# Patient Record
Sex: Female | Born: 1935 | Race: White | Hispanic: No | Marital: Married | Smoking: Never smoker
Health system: Southern US, Community
[De-identification: ages and names within clinical notes are randomized; demographics above are authoritative.]

## PROBLEM LIST (undated history)

## (undated) DIAGNOSIS — Z9889 Other specified postprocedural states: Secondary | ICD-10-CM

## (undated) DIAGNOSIS — Z9289 Personal history of other medical treatment: Secondary | ICD-10-CM

## (undated) DIAGNOSIS — M1990 Unspecified osteoarthritis, unspecified site: Secondary | ICD-10-CM

## (undated) DIAGNOSIS — I10 Essential (primary) hypertension: Secondary | ICD-10-CM

## (undated) DIAGNOSIS — I8390 Asymptomatic varicose veins of unspecified lower extremity: Secondary | ICD-10-CM

## (undated) DIAGNOSIS — R112 Nausea with vomiting, unspecified: Secondary | ICD-10-CM

## (undated) DIAGNOSIS — Z9109 Other allergy status, other than to drugs and biological substances: Secondary | ICD-10-CM

## (undated) DIAGNOSIS — C801 Malignant (primary) neoplasm, unspecified: Secondary | ICD-10-CM

## (undated) DIAGNOSIS — E079 Disorder of thyroid, unspecified: Secondary | ICD-10-CM

## (undated) DIAGNOSIS — Z85038 Personal history of other malignant neoplasm of large intestine: Secondary | ICD-10-CM

## (undated) DIAGNOSIS — I4891 Unspecified atrial fibrillation: Secondary | ICD-10-CM

## (undated) HISTORY — DX: Malignant (primary) neoplasm, unspecified: C80.1

## (undated) HISTORY — DX: Unspecified osteoarthritis, unspecified site: M19.90

## (undated) HISTORY — DX: Unspecified atrial fibrillation: I48.91

## (undated) HISTORY — DX: Disorder of thyroid, unspecified: E07.9

## (undated) HISTORY — DX: Other allergy status, other than to drugs and biological substances: Z91.09

## (undated) HISTORY — DX: Personal history of other medical treatment: Z92.89

## (undated) HISTORY — DX: Personal history of other malignant neoplasm of large intestine: Z85.038

## (undated) HISTORY — DX: Hemochromatosis, unspecified: E83.119

## (undated) HISTORY — DX: Essential (primary) hypertension: I10

---

## 1989-12-31 HISTORY — PX: BREAST SURGERY: SHX581

## 1989-12-31 HISTORY — PX: BREAST EXCISIONAL BIOPSY: SUR124

## 1994-12-31 HISTORY — PX: COLON SURGERY: SHX602

## 1996-12-31 HISTORY — DX: Hemochromatosis, unspecified: E83.119

## 1998-04-21 ENCOUNTER — Other Ambulatory Visit: Admission: RE | Admit: 1998-04-21 | Discharge: 1998-04-21 | Payer: Self-pay | Admitting: Oncology

## 1998-06-09 ENCOUNTER — Ambulatory Visit (HOSPITAL_COMMUNITY): Admission: RE | Admit: 1998-06-09 | Discharge: 1998-06-09 | Payer: Self-pay | Admitting: Internal Medicine

## 1998-07-26 ENCOUNTER — Other Ambulatory Visit: Admission: RE | Admit: 1998-07-26 | Discharge: 1998-07-26 | Payer: Self-pay | Admitting: Oncology

## 2000-05-14 ENCOUNTER — Ambulatory Visit (HOSPITAL_COMMUNITY): Admission: RE | Admit: 2000-05-14 | Discharge: 2000-05-14 | Payer: Self-pay | Admitting: Gastroenterology

## 2000-10-03 ENCOUNTER — Ambulatory Visit (HOSPITAL_COMMUNITY): Admission: RE | Admit: 2000-10-03 | Discharge: 2000-10-03 | Payer: Self-pay | Admitting: Orthopedic Surgery

## 2000-10-03 ENCOUNTER — Encounter: Payer: Self-pay | Admitting: Orthopedic Surgery

## 2001-06-19 ENCOUNTER — Encounter: Payer: Self-pay | Admitting: Orthopedic Surgery

## 2001-06-19 ENCOUNTER — Encounter: Admission: RE | Admit: 2001-06-19 | Discharge: 2001-06-19 | Payer: Self-pay | Admitting: Orthopedic Surgery

## 2002-06-30 ENCOUNTER — Encounter: Payer: Self-pay | Admitting: Orthopedic Surgery

## 2002-06-30 ENCOUNTER — Encounter: Admission: RE | Admit: 2002-06-30 | Discharge: 2002-06-30 | Payer: Self-pay | Admitting: Orthopedic Surgery

## 2002-12-31 HISTORY — PX: TOTAL HIP ARTHROPLASTY: SHX124

## 2003-08-30 ENCOUNTER — Ambulatory Visit (HOSPITAL_COMMUNITY): Admission: RE | Admit: 2003-08-30 | Discharge: 2003-08-30 | Payer: Self-pay | Admitting: Gastroenterology

## 2003-11-29 ENCOUNTER — Inpatient Hospital Stay (HOSPITAL_COMMUNITY): Admission: RE | Admit: 2003-11-29 | Discharge: 2003-12-02 | Payer: Self-pay | Admitting: Orthopedic Surgery

## 2003-12-02 ENCOUNTER — Inpatient Hospital Stay (HOSPITAL_COMMUNITY)
Admission: RE | Admit: 2003-12-02 | Discharge: 2003-12-08 | Payer: Self-pay | Admitting: Physical Medicine & Rehabilitation

## 2003-12-31 ENCOUNTER — Inpatient Hospital Stay (HOSPITAL_COMMUNITY): Admission: EM | Admit: 2003-12-31 | Discharge: 2004-01-14 | Payer: Self-pay | Admitting: Emergency Medicine

## 2004-01-01 HISTORY — PX: GALLBLADDER SURGERY: SHX652

## 2004-01-12 ENCOUNTER — Encounter (INDEPENDENT_AMBULATORY_CARE_PROVIDER_SITE_OTHER): Payer: Self-pay | Admitting: *Deleted

## 2004-05-23 ENCOUNTER — Encounter: Admission: RE | Admit: 2004-05-23 | Discharge: 2004-05-23 | Payer: Self-pay | Admitting: Gastroenterology

## 2004-07-25 IMAGING — CT CT ABDOMEN W/ CM
1 series · 14 of 32 positions shown, 18 images · IV contrast (WATER & 150M L OMNI 300)
Comparison: none

CLINICAL DATA: Abdominal pain with laboratory evident of pancreatitis. 
CT ABDOMEN WITH CONTRAST, CT PELVIS WITH CONTRAST 12/31/03
No prior CT studies for comparison. 
Contrast:  150 cc Omnipaque 300 IV.  Oral contrast was not administered but the patient did receive several cups of water to distend the stomach and duodenum just prior to the procedure.  
CT ABDOMEN WITH CONTRAST
Lung bases show bibasilar atelectasis.  
The dominant finding in the abdomen is evidence of severe pancreatitis with inflammatory changes and fluid surrounding the majority of the pancreas.  No associated pancreatic necrosis is evident by CT.  There is no evidence of focal abscess.  
There are associated visible gallstones in the gallbladder as well as significant dilatation of the common bile duct which measures 14.0 mm in maximum diameter.  A small amount of anterior perihepatic fluid is present as well as pericholecystic fluid.  Peripancreatic fluid extends inferiorly in the retroperitoneum to abut bilateral pericolic regions.  No associated bowel obstruction. 
Several hepatic cysts are present.  The largest located in the anterior and superior right hepatic lobe measures 5.7 cm in greatest diameter.  This particular cyst shows internal Hounsfield unit density measurements of 30 to 40 which is consistent with higher density complex fluid.  No associated air in this collection.  At least five other cysts are present in the liver.  These all contain simple fluid internally by Hounsfield unit density measurements.  Other cysts are annotated with measurements and densities on the CT data set.  
Giant right renal cyst present emanating from the upper pole and measuring 8.7 cm in greatest diameter.  Internal densities consistent with simple fluid.  No evidence of hydronephrosis.  Focal periumbilical hernia present containing a portion of the colon.  This is not associated with abnormal fluid collection or bowel wall thickening.  
IMPRESSION
Severe pancreatitis with associated cholelithiasis and common bile duct dilatation to 14.0 mm; there may be obstructing stones and or sludge in the distal common duct.  Associated peripancreatic and retroperitoneal fluid as well as perihepatic and pericholecystic fluid.  No pancreatic necrosis or focal abscess is evident by CT.  
Multiple hepatic cysts, one of which shows complex internal density measurement.   There also is a large right renal cyst which appears simple. 
Small periumbilical hernia containing a portion of the colon. 
CT PELVIS WITH CONTRAST
A small amount of free fluid tracks into the pelvis from pancreatitis in the abdomen above.  Pelvic bowel loops show no inflammation or dilatation.  The bladder is moderately distended. 
Small amount of free pelvic fluid.  

ree

[Series 2: abd pelvis · axial · 0.66mm/px · z∈[-403,+12]mm · 14 of 123 slices shown, 18 images]
[im 8/123  soft-tissue]
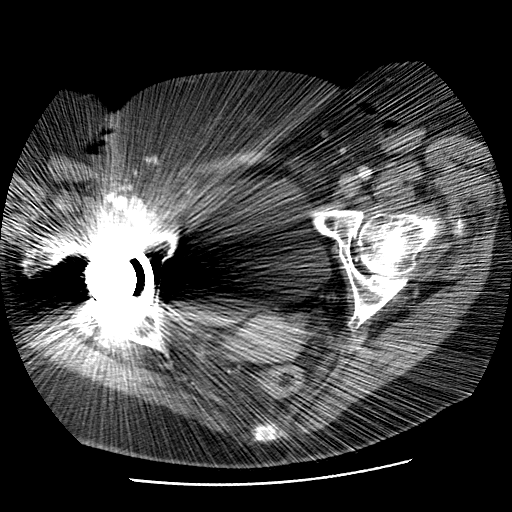
[im 8/123  bone]
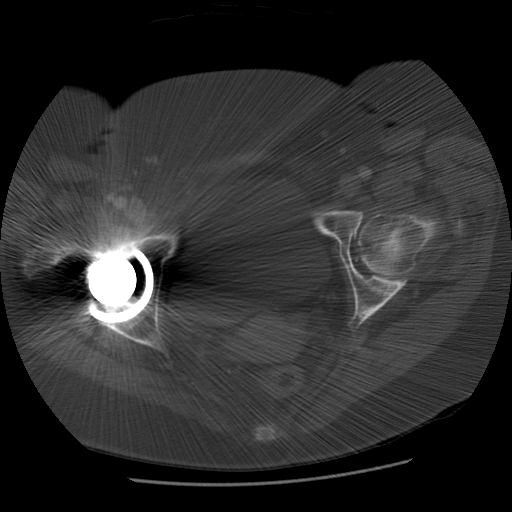
[im 16/123  soft-tissue]
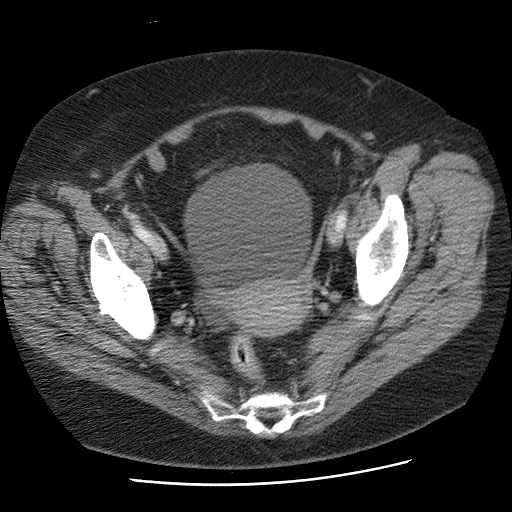
[im 28/123  soft-tissue]
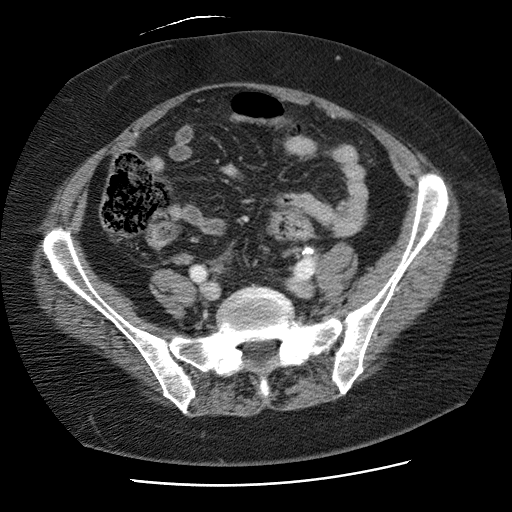
[im 36/123  soft-tissue]
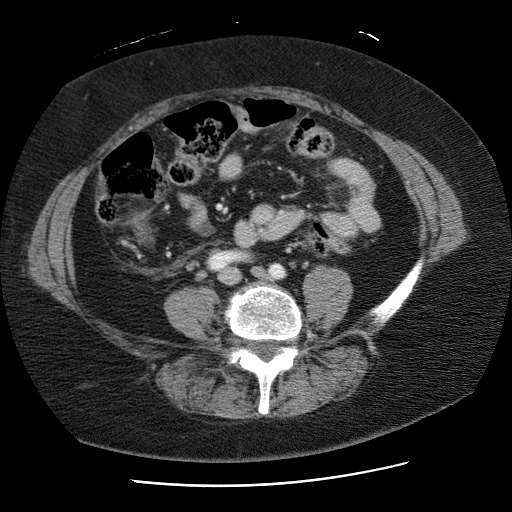
[im 48/123  soft-tissue]
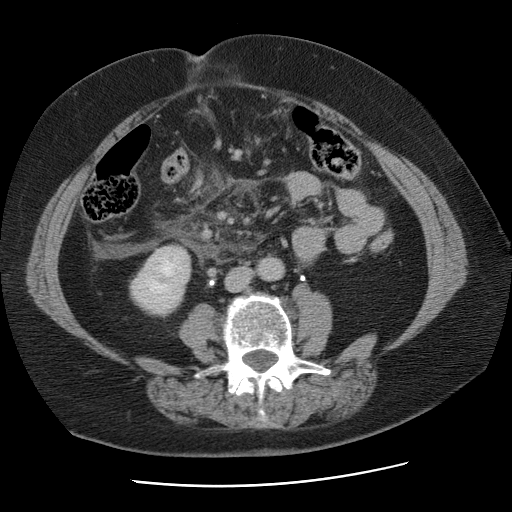
[im 56/123  soft-tissue]
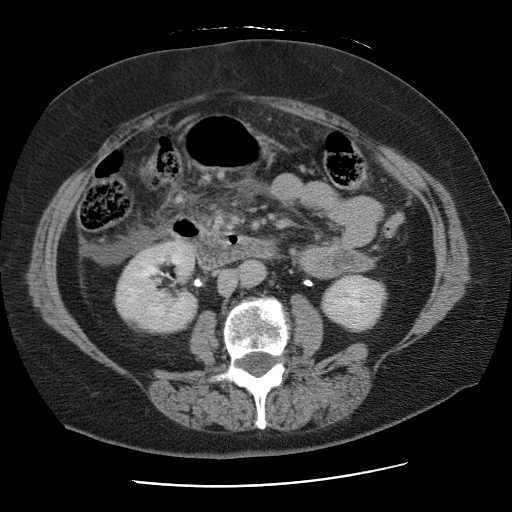
[im 67/123  soft-tissue]
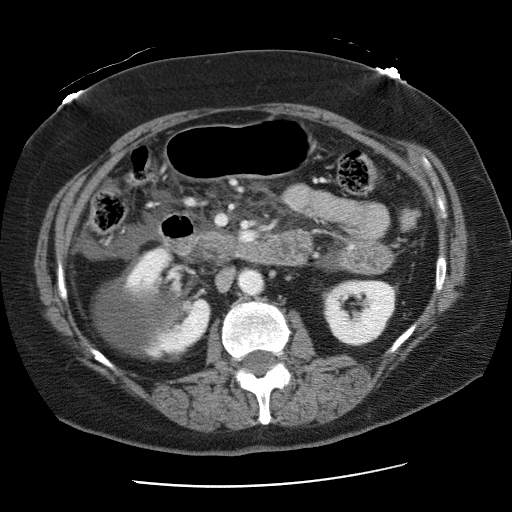
[im 75/123  soft-tissue]
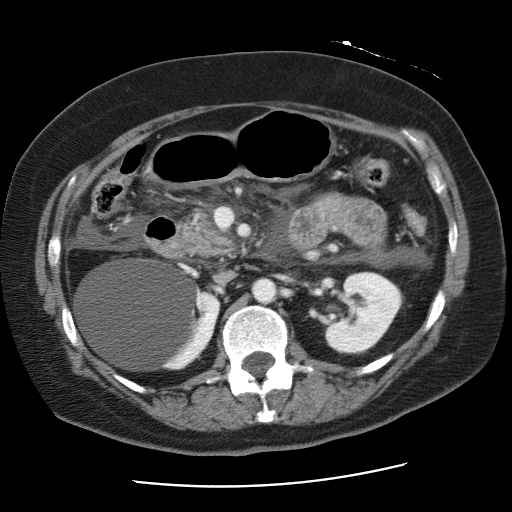
[im 87/123  soft-tissue]
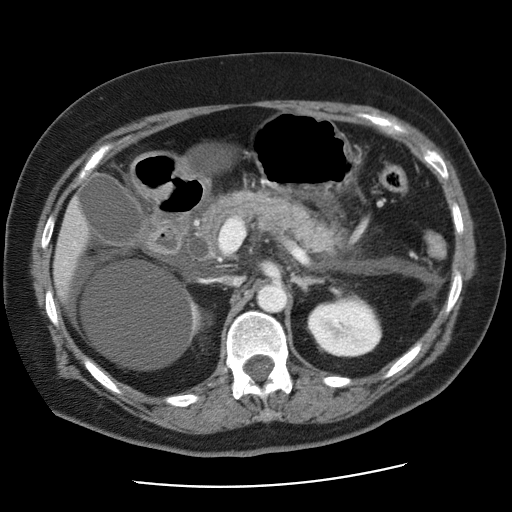
[im 87/123  bone]
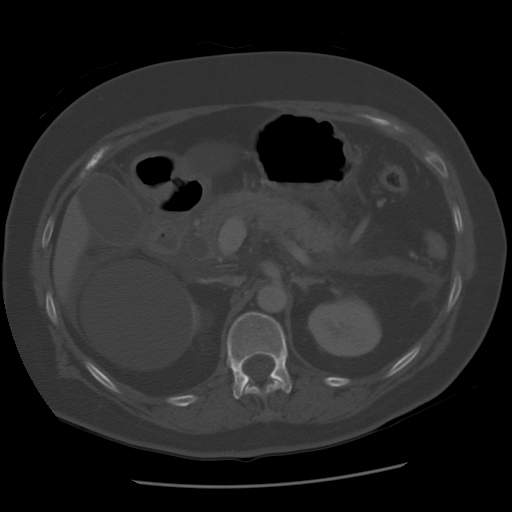
[im 95/123  soft-tissue]
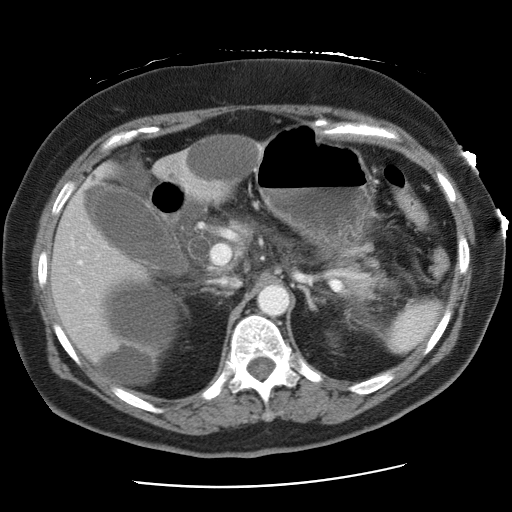
[im 107/123  soft-tissue]
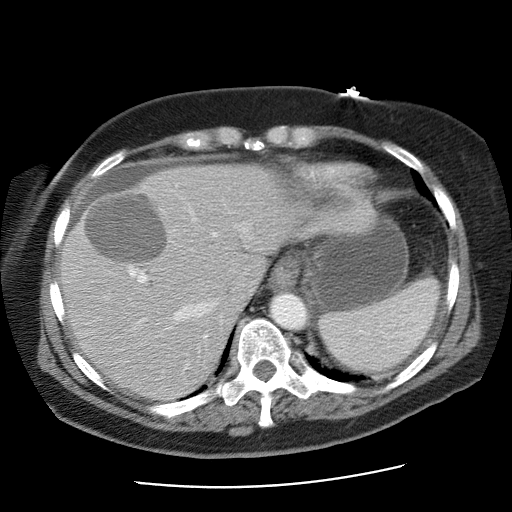
[im 107/123  lung]
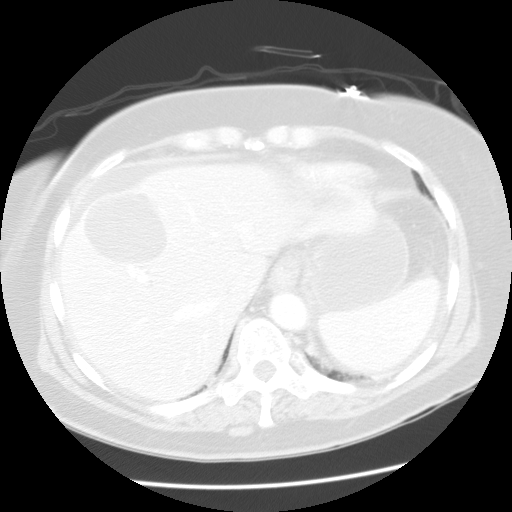
[im 111/123  lung]
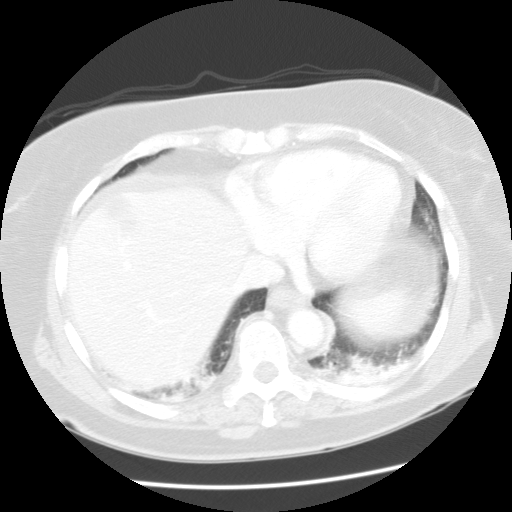
[im 115/123  soft-tissue]
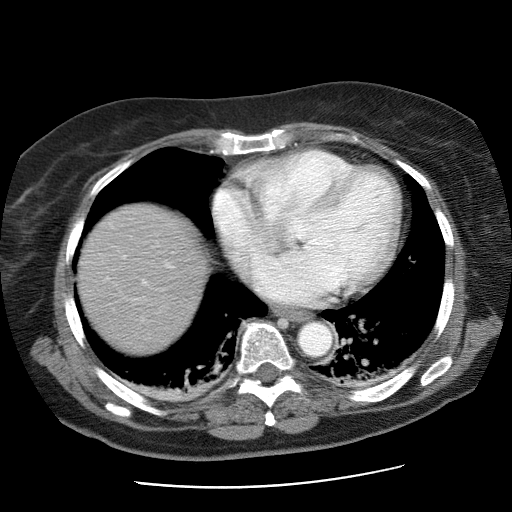
[im 115/123  lung]
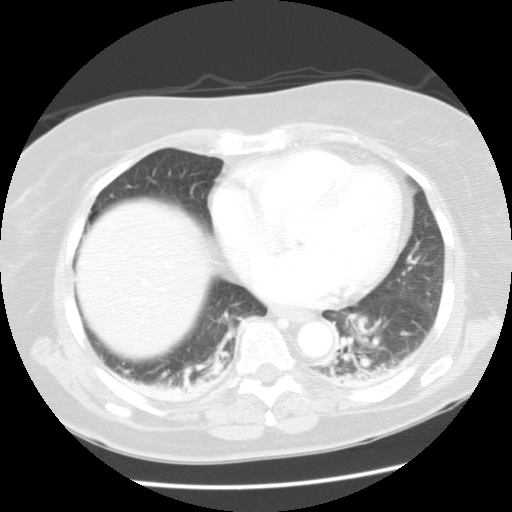
[im 119/123  lung]
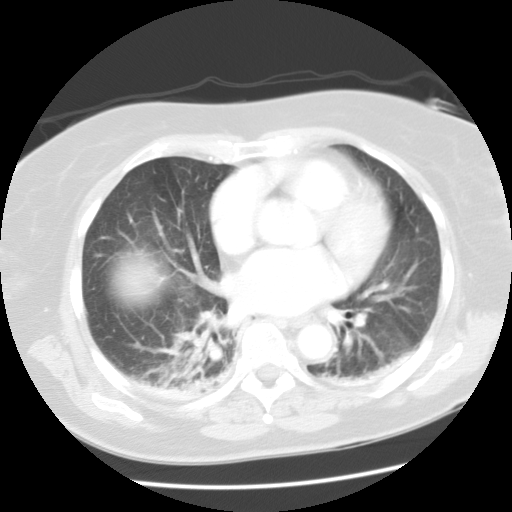

[14 of 32 positions shown; findings below may reference images not displayed]

## 2004-07-25 IMAGING — US US ABDOMEN COMPLETE
1 series · 13 of 25 positions shown · non-contrast
Comparison: none

CLINICAL DATA: Abdominal pain. Pancreatitis.  Dilated common bile duct. 
COMPLETE ABDOMINAL ULTRASOUND ? 12/31/03
Ultrasound demonstrates numerous stones in the gallbladder.  Gallbladder wall is thickened at 8.1mm. The common bile duct is dilated to a diameter of 1.2 cm.  There appears to be sludge or small stones that don't shadow  in the distal common bile duct.  
There is a complex cystic lesion in the anterior aspect of the right lobe of the liver just superior to the gallbladder. On the CT scan, there appear to be a fairly simple cystic lesion. However, on ultrasound, it has mixed solid and cystic components that measure approximately 5 cm in diameter. The other lesions in the liver on CT scan appear to be cysts.  
There is an 8.7 cm simple appearing cyst on the upper pole of the right kidney.  The left kidney is normal.  
The spleen is not enlarged.  
IMPRESSION 
Numerous gallstones with a thickened gallbladder wall and a dilated common bile duct with evidence consistent with a stone in the distal common bile duct. 
Complex mixed solid and cystic lesion in the anterior aspect of the right lobe of the liver.  MRI may be useful for further characterization of this complex lesion.  The patient does have multiple other benign appearing cystic lesions in the liver and this perhaps is a hemorrhagic or infected cyst.

[Series 1: unknown · 0.38mm/px · 13 of 72 slices shown]
[im 1/72]
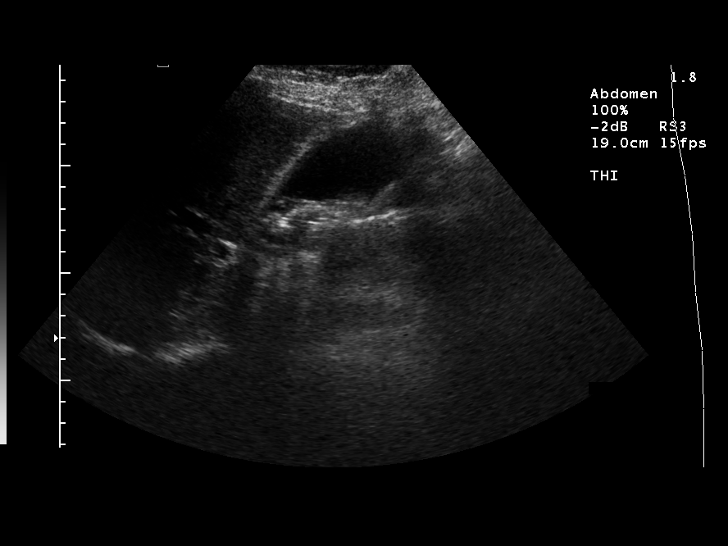
[im 6/72]
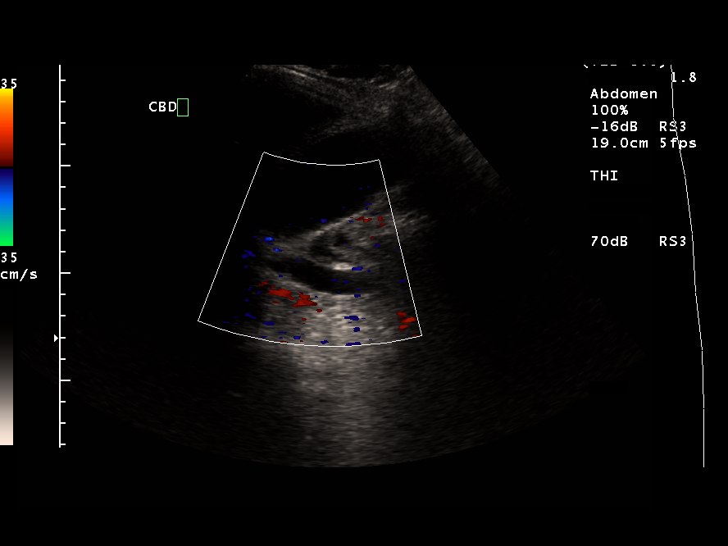
[im 12/72]
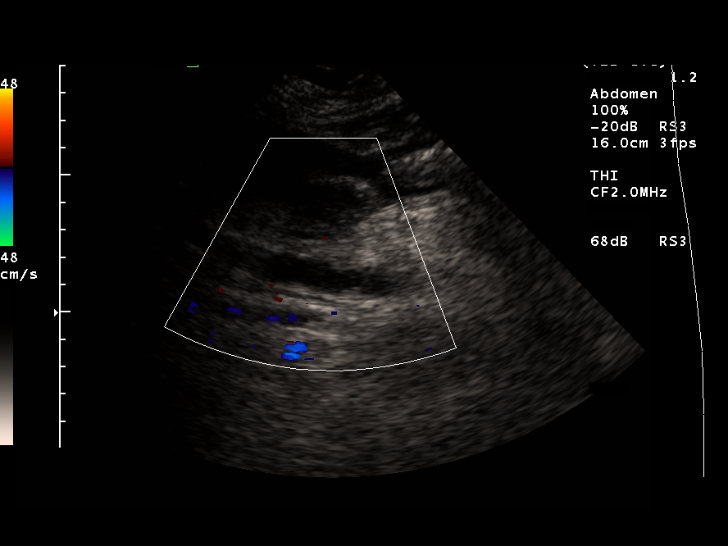
[im 18/72]
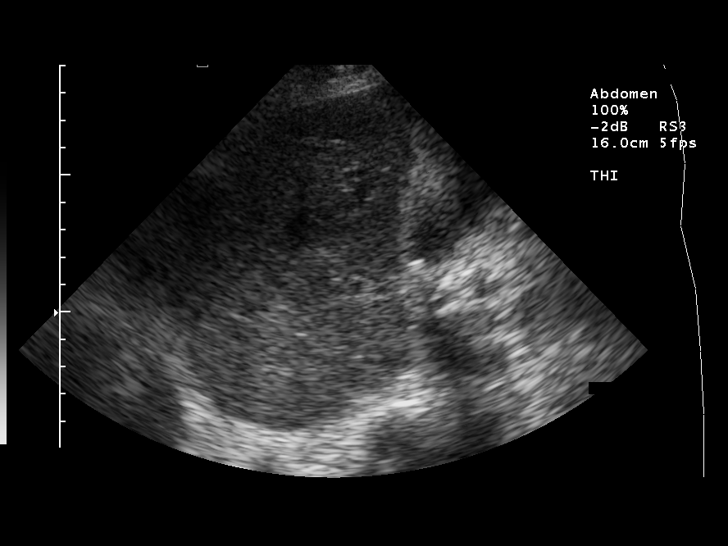
[im 24/72]
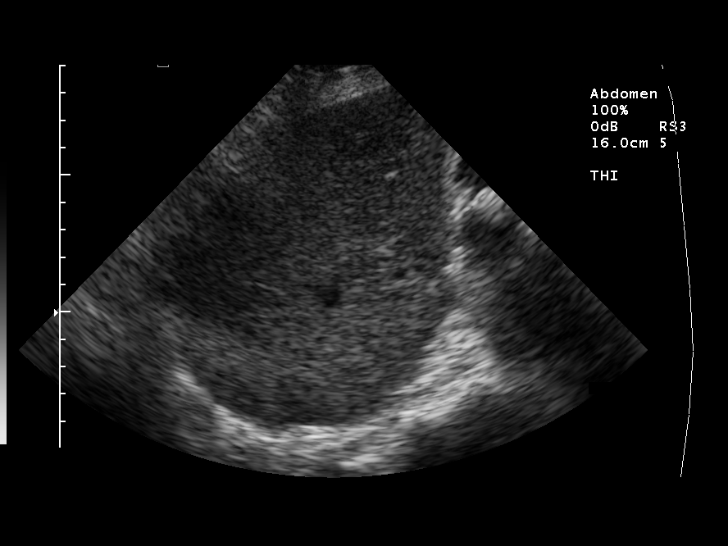
[im 30/72]
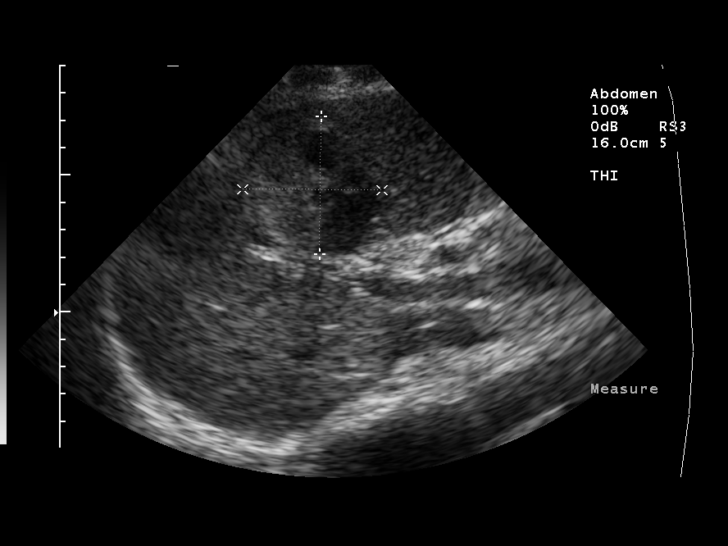
[im 36/72]
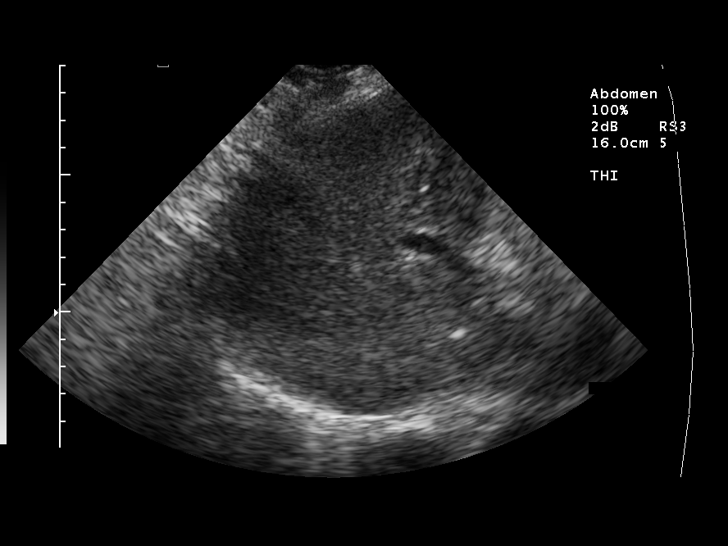
[im 42/72]
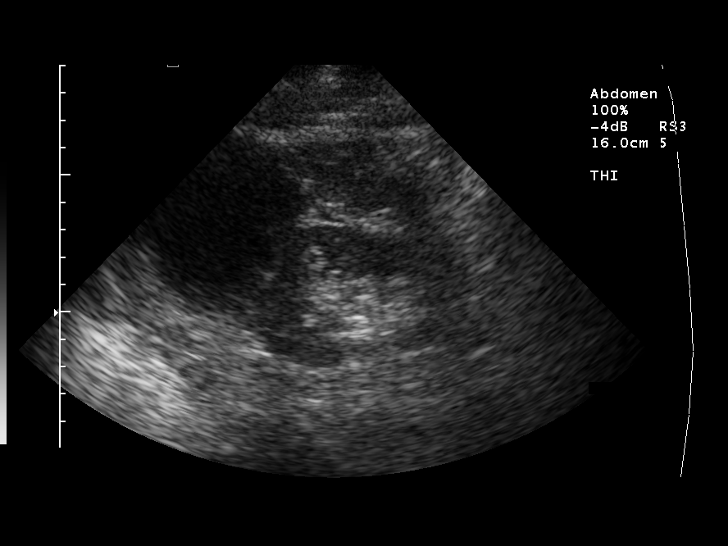
[im 48/72]
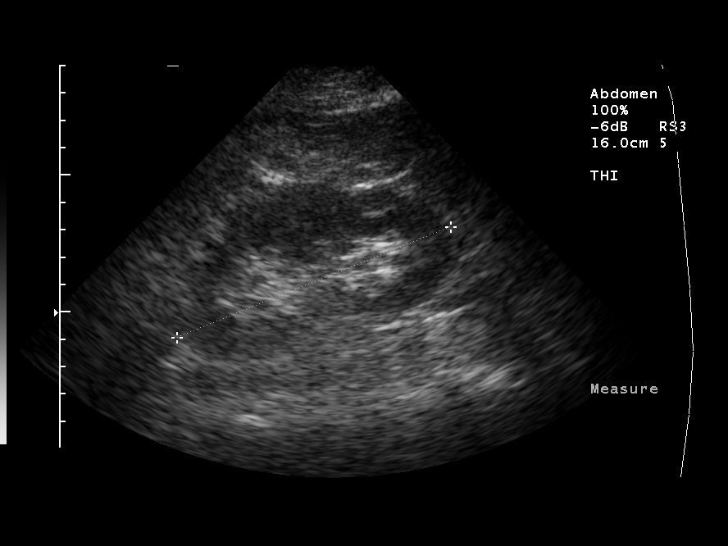
[im 54/72]
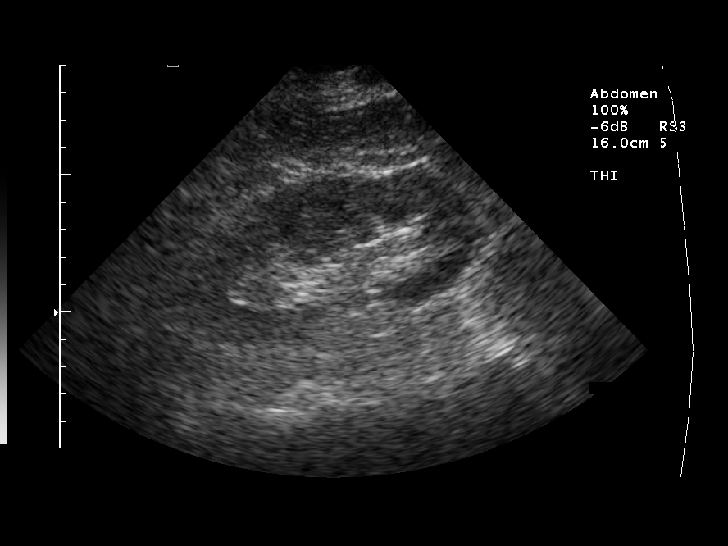
[im 60/72]
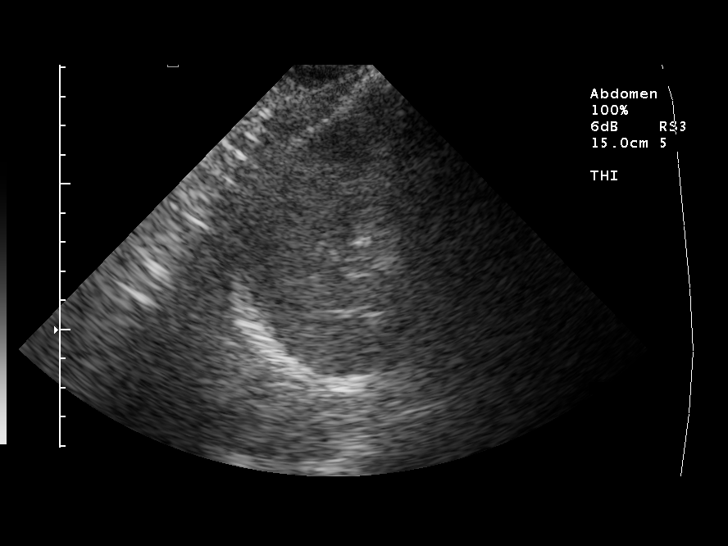
[im 66/72]
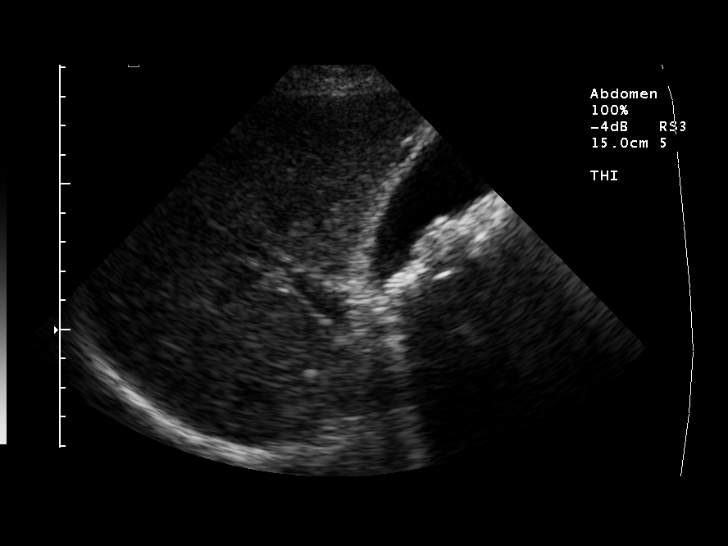
[im 72/72]
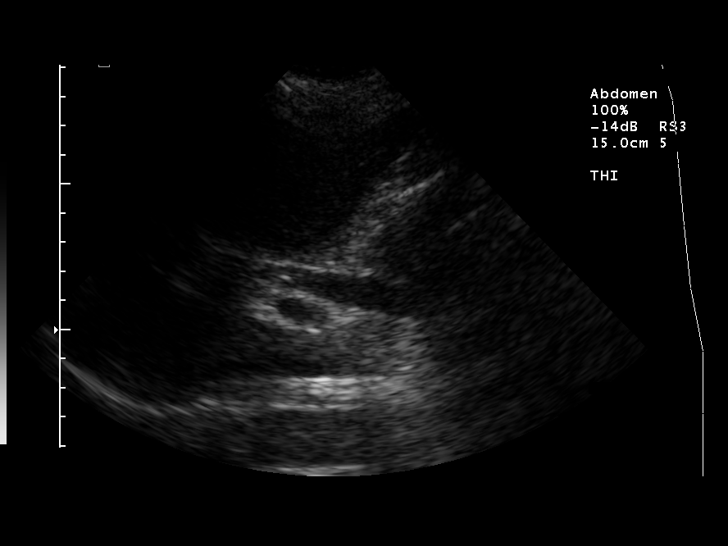

[13 of 25 positions shown; findings below may reference images not displayed]

## 2004-07-30 IMAGING — CT CT ABDOMEN W/ CM
1 series · 15 of 32 positions shown, 19 images · IV contrast (agent unspecified)
Comparison: 12/31/03
 Multidetector helical study performed following oral and IV contrast.

CLINICAL DATA: Follow-up acute pancreatitis. 
 CT OF THE ABDOMEN WITH IV CONTRAST ? 01/05/04

[Series 2: abd pelvis · axial · 0.70mm/px · z∈[-297,-52]mm · 15 of 90 slices shown, 19 images]
[im 6/90  soft-tissue]
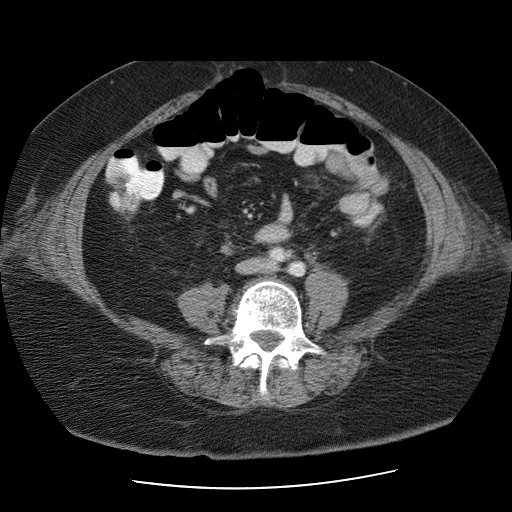
[im 6/90  bone]
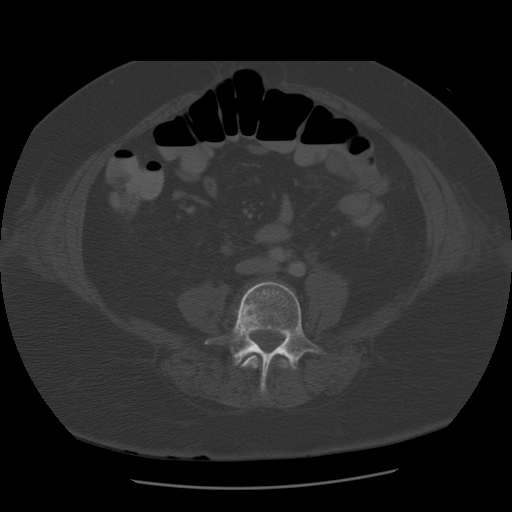
[im 12/90  soft-tissue]
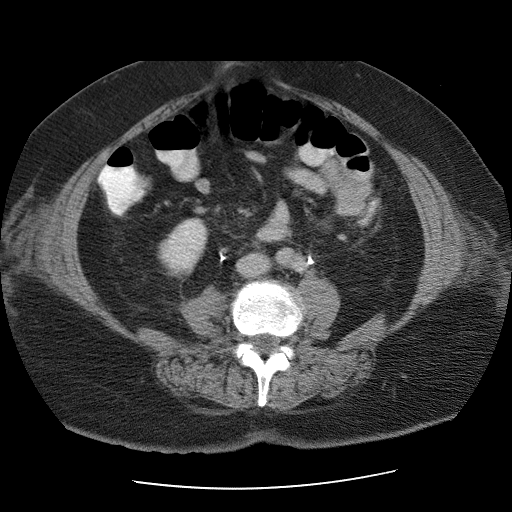
[im 18/90  soft-tissue]
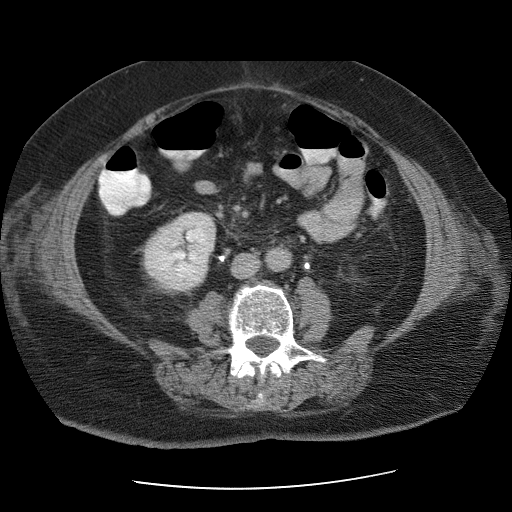
[im 26/90  soft-tissue]
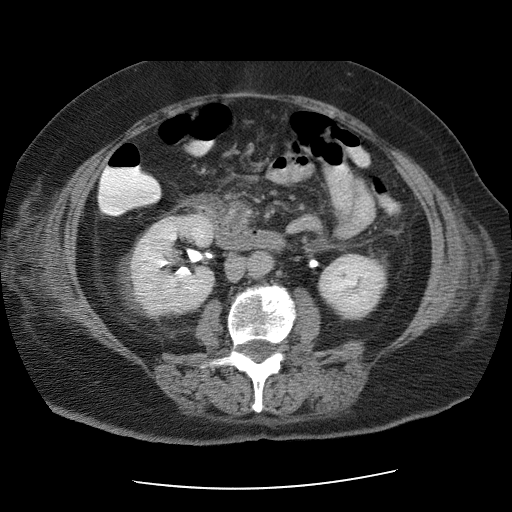
[im 32/90  soft-tissue]
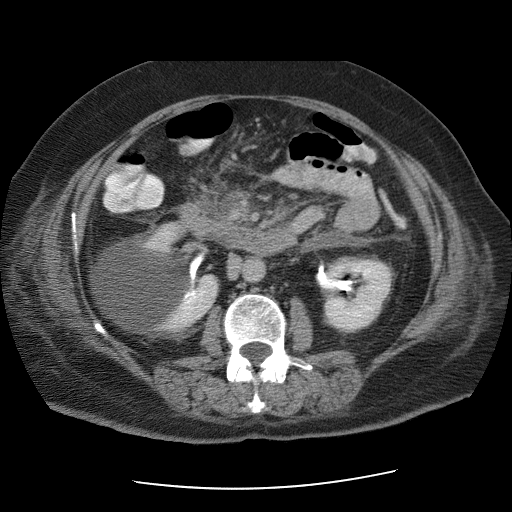
[im 38/90  soft-tissue]
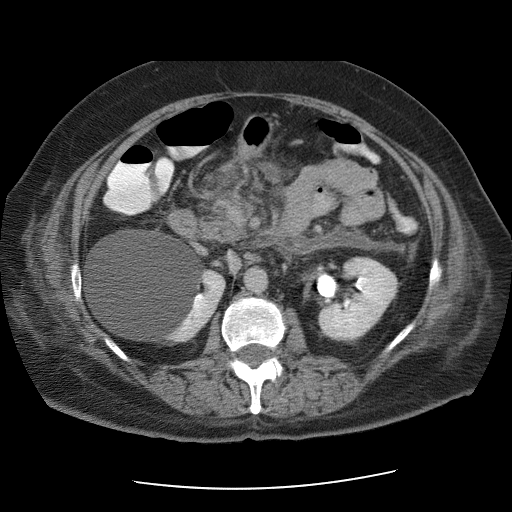
[im 46/90  soft-tissue]
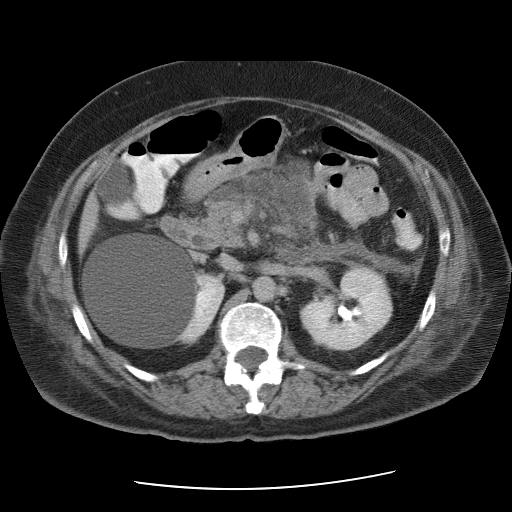
[im 52/90  soft-tissue]
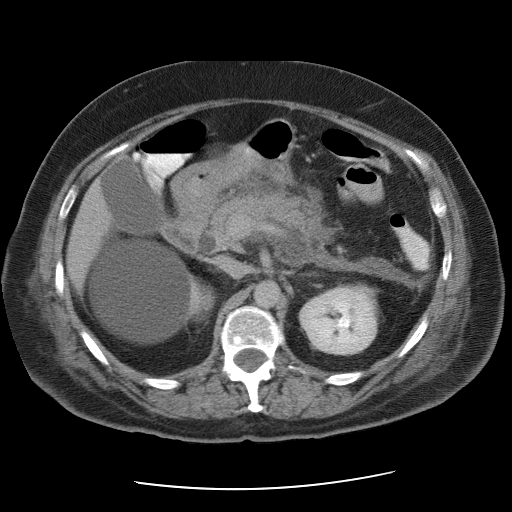
[im 58/90  soft-tissue]
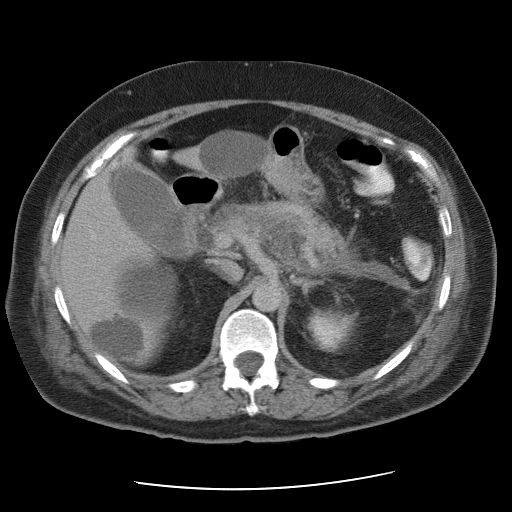
[im 58/90  bone]
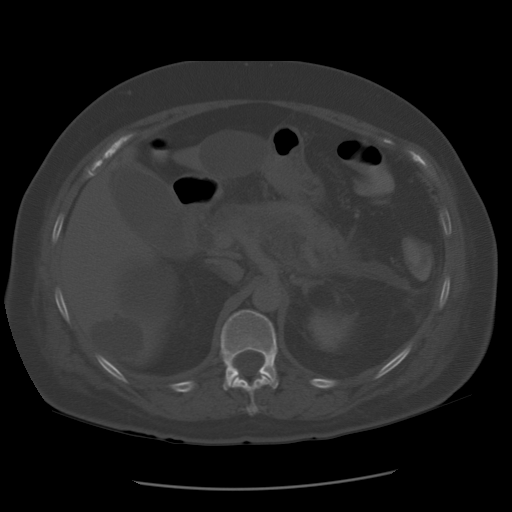
[im 64/90  soft-tissue]
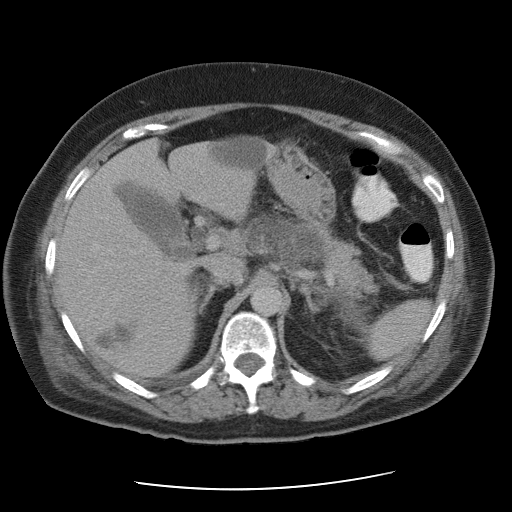
[im 72/90  soft-tissue]
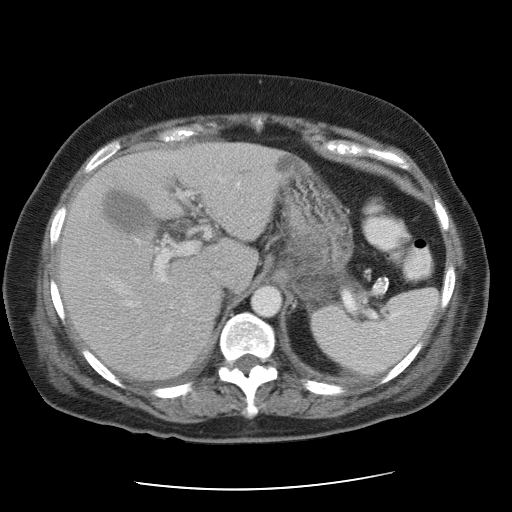
[im 78/90  soft-tissue]
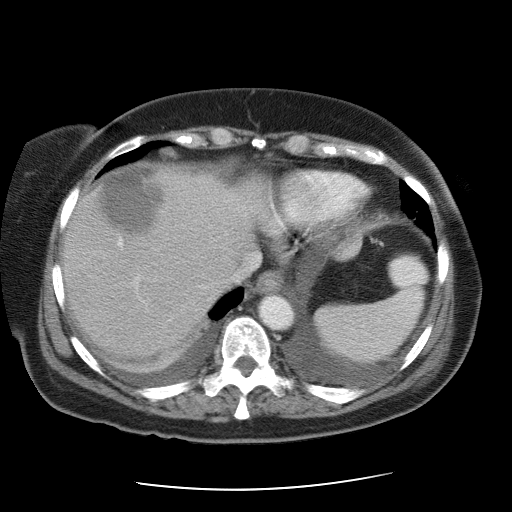
[im 78/90  lung]
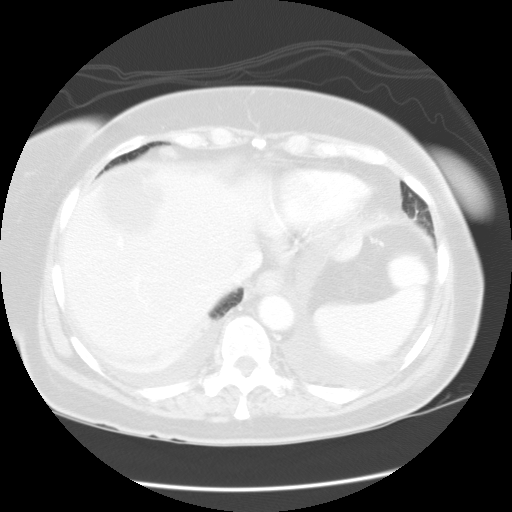
[im 81/90  lung]
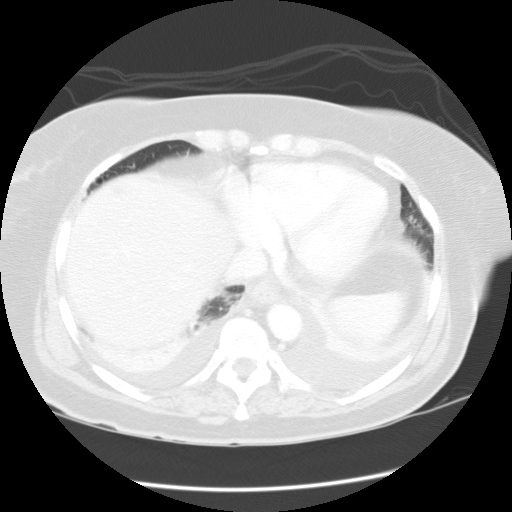
[im 84/90  soft-tissue]
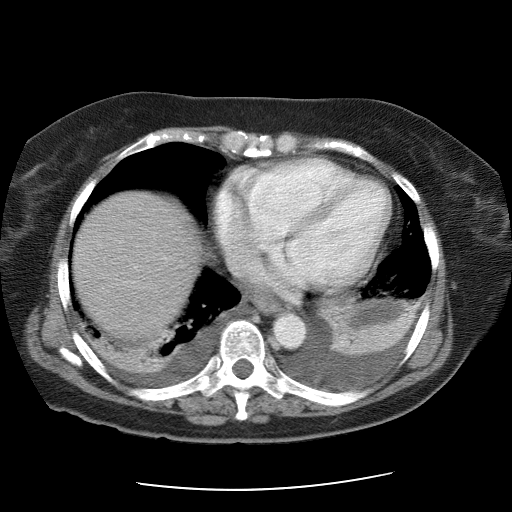
[im 84/90  lung]
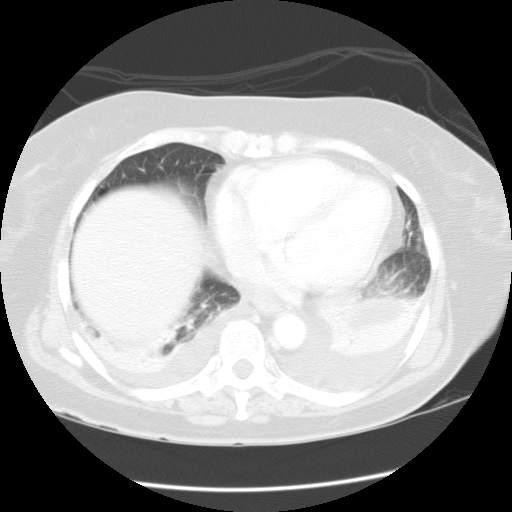
[im 87/90  lung]
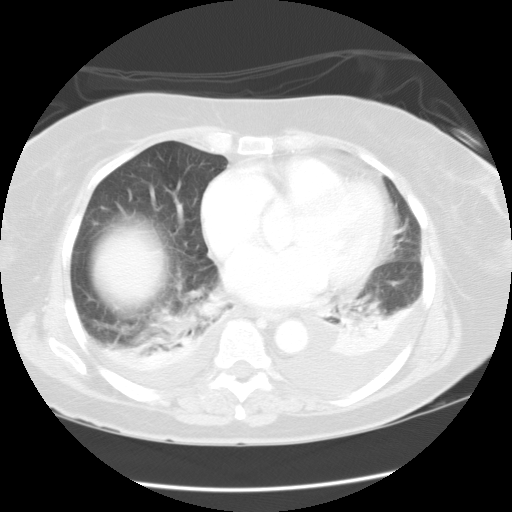

[15 of 32 positions shown; findings below may reference images not displayed]

150 cc of Omnipaque 300 were utilized as the IV contrast agent.  Again seen are changes of acute pancreatitis with enlargement of the pancreas and peripancreatic fluid. The peripancreatic fluid seen in the region of the body and tail of the pancreas has mildly increased intervally.  There is no evidence for pancreatic necrosis or pseudocyst formation at this time. As noted previously there are gallstones present and dilatation of the common bile duct is noted measuring up to 14 mm in diameter. This has not significantly changed.   In addition, there are multiple hepatic cysts.  A cyst within the right lobe of the liver measures 5.8 cm in diameter and measures 35 Hounsfield units in density and would be consistent with complicated cyst as previously described.  The remaining cysts have a simple appearance and there is a very large right renal cyst which has a simple appearance and is unchanged in size and configuration.  There is a small periumbilical hernia which contains a portion of the transverse colon ? unchanged.  The small amount of free peritoneal fluid surrounding the right lobe of the liver has decreased intervally.  There are small bilateral pleural effusions and there are bibasilar atelectatic changes.  
 IMPRESSION 
 Changes of acute pancreatitis with interval increase in peripancreatic fluid as discussed. 
 Cholelithiasis and dilated common bile duct ? unchanged.  
 5.8 cm in size complicated cystic lesion associated with the right lobe of the liver ? unchanged in appearance. 
 Multiple simple appearing hepatic cysts and large right renal cyst ? stable. 
 Small periumbilical hernia, unchanged.  
 Interval development of small bilateral pleural effusions. There are also bibasilar atelectatic changes.

## 2004-08-07 IMAGING — MR MR MRCP
9 of 11 series · 23 of 48 positions shown · IV contrast (omniscan)
Comparison: none

CLINICAL DATA: 74 year old with pancreatitis status post cholecystectomy.  Complex liver lesion.
 MRI OF THE ABDOMEN WITHOUT AND WITH CONTRAST 
 Multiplanar, multi-sequence imaging of the abdomen was performed pre- and post administration of a total of 40 cc of Omniscan.  
 This study is correlated with the prior CT scans.  
 The patient has had a cholecystectomy since the last   CT scan.  
 In the liver, there are several lesions.  There are two large, benign-appearing cysts.  One is in the left hepatic lobe, and one is in the right hepatic lobe inferiorly.  These are also benign by CT.  In the right hepatic lobe near the junction with the left hepatic lobe, there is a complex lesion which measures approximately 5.2 x 4.2 cm.  It is rounded and well-circumscribed and on the T2 images has a somewhat dark rim around it, which may just be chemical shift artifact, but it could also be a hemosiderin ring.    On the T1-weighted images, this has heterogeneous high-signal intensity, suggesting that there are blood products, and the superior margin of this lesion has more typical fluid signal intensity.  I think this is a complex hemorrhagic cyst, and I do not see any evidence of enhancement postcontrast.  The other liver lesions demonstrate no enhancement.  There is a small amount of fluid around the liver, probably related to the patient?s pancreatitis.  No focal pancreatic lesions are seen, no evidence for a pancreatic head mass.  The pancreatic duct is normal in caliber.  There is a large simple-appearing cyst associated with the right kidney.  Otherwise, the kidneys appear normal.  Adrenal glands are unremarkable.  The spleen is normal in size.  
 There is mild fluid and edema around the pancreas, consistent with the patient?s pancreatitis.  No evidence for pancreatic divisum.  The common bile duct is dilated, measuring a maximum of approximately 12.5 mm.

[Series 1: 3pl loc · axial · 8.0mm · 1.56mm/px · z∈[-50,+200]mm · 2 of 33 slices shown]
[im 1/33]
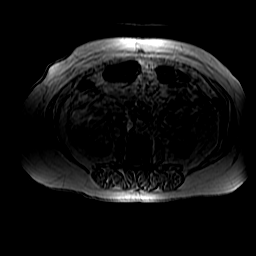
[im 33/33]
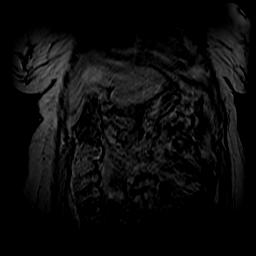

[Series 2: T2 · coronal · 5.0mm · 0.86mm/px · 2 of 20 slices shown (1 of 2)]
[im 1/20]
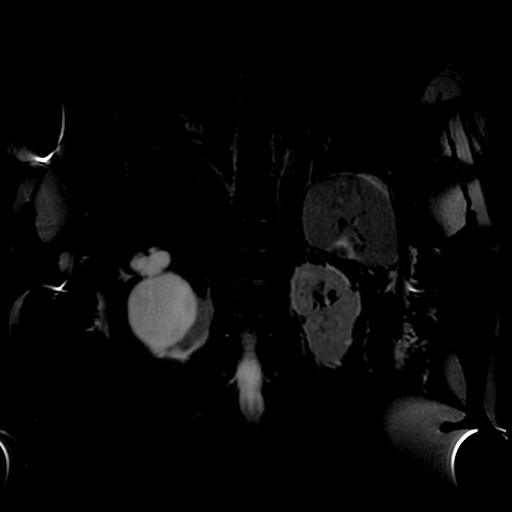
[im 20/20]
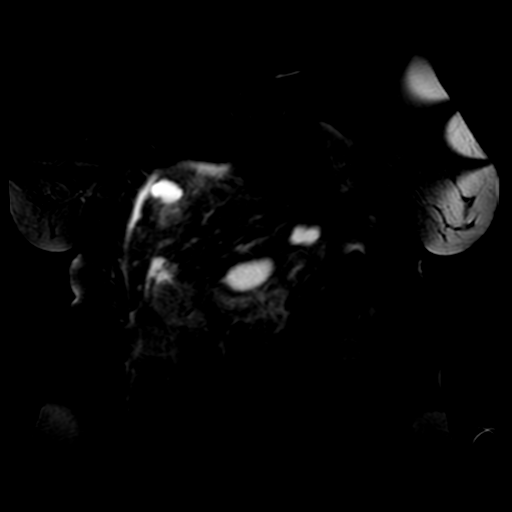

[Series 3: T2 · axial · 10.0mm · 0.86mm/px · z∈[-70,+150]mm · 2 of 23 slices shown (2 of 2)]
[im 1/23]
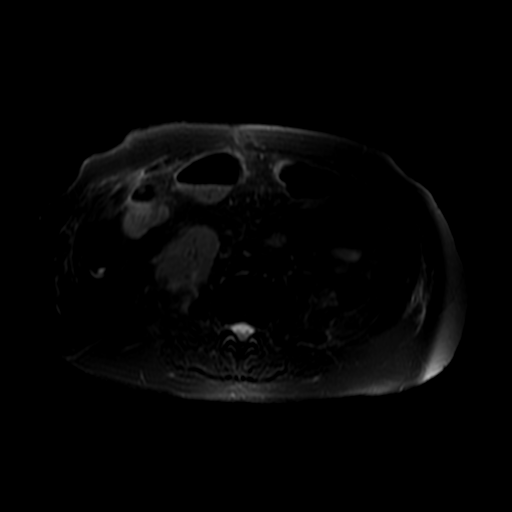
[im 23/23]
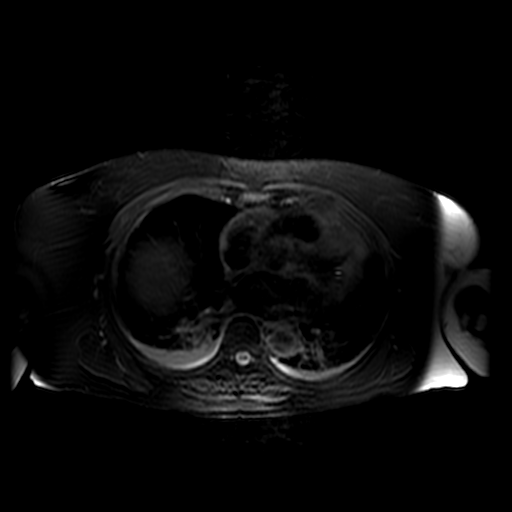

[Series 4: thick slab abdomen · coronal · 70.0mm · 0.78mm/px · 1 of 4 slices shown]
[im 1/4]
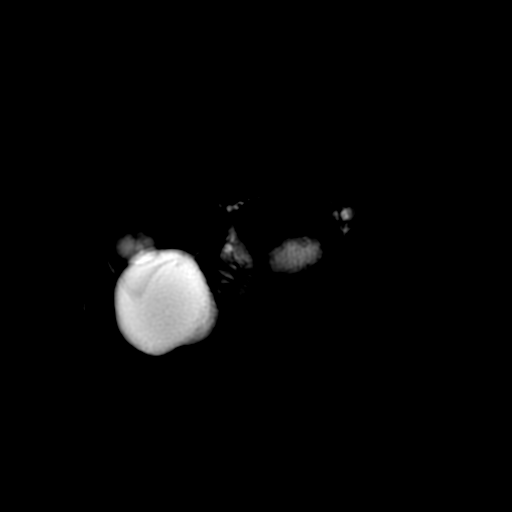

[Series 5: MRCP · coronal · 4.0mm · 1.41mm/px · 2 of 28 slices shown (1 of 2)]
[im 1/28]
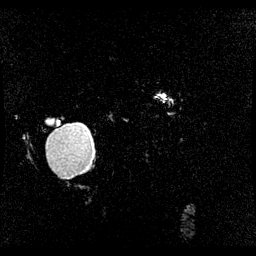
[im 28/28]
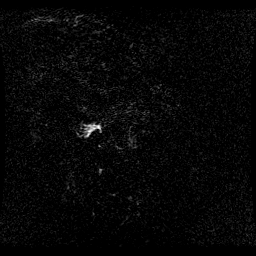

[Series 6: MRCP · axial · 4.0mm · 1.41mm/px · z∈[-33,+107]mm · 3 of 36 slices shown (2 of 2)]
[im 1/36]
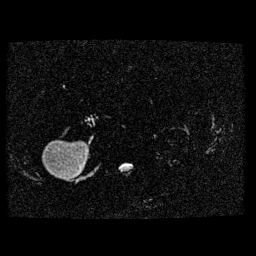
[im 18/36]
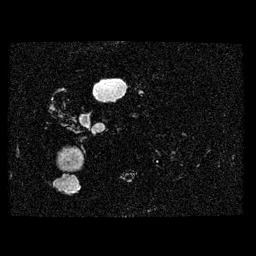
[im 36/36]
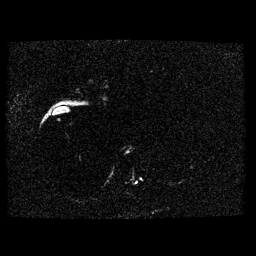

[Series 7: ax fspgr w/fs · axial · 10.0mm · 0.78mm/px · 1 of 18 slices shown]
[im 1/18]
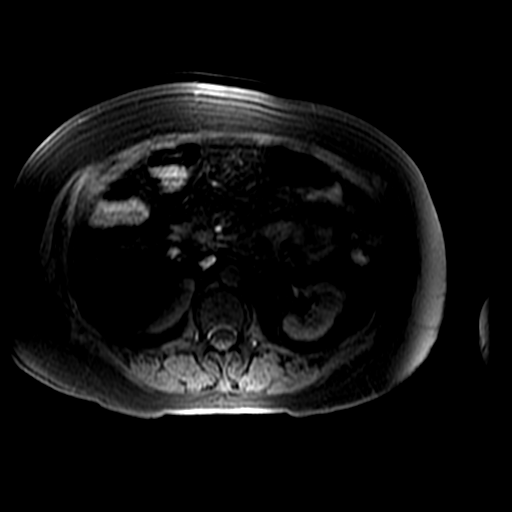

[Series 8: ax fspgr dual · axial · 10.0mm · 0.78mm/px · z∈[-18,+152]mm · 3 of 36 slices shown]
[im 1/36]
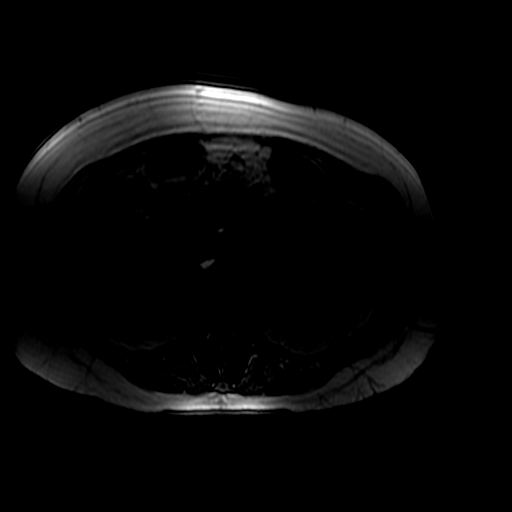
[im 18/36]
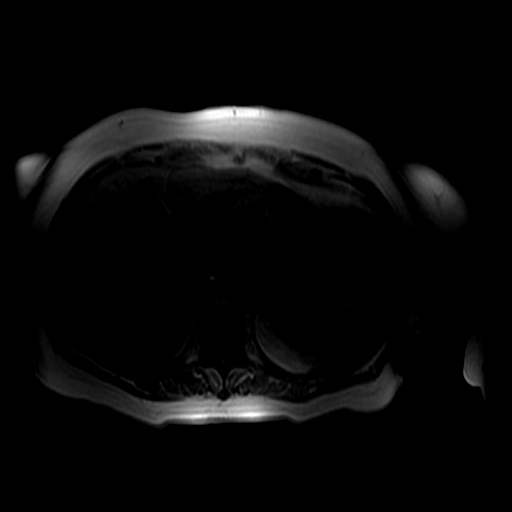
[im 36/36]
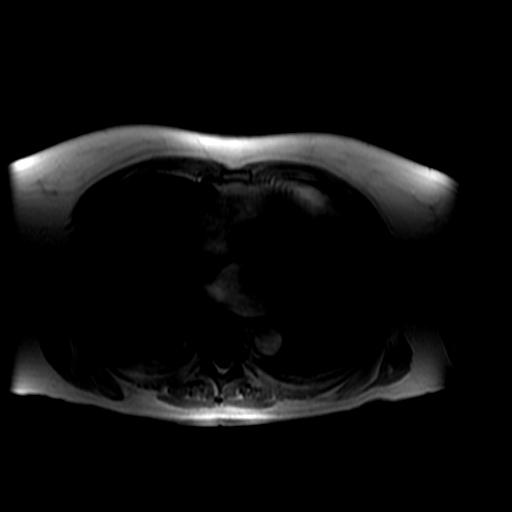

[Series 9: T1 dynamic post-contrast · axial · non-contrast · 5.0mm · 0.78mm/px · z∈[+14,+116]mm · 7 of 364 slices shown]
[im 13/364]
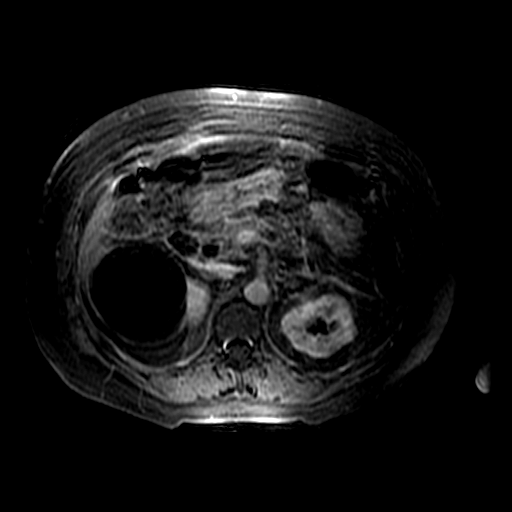
[im 63/364]
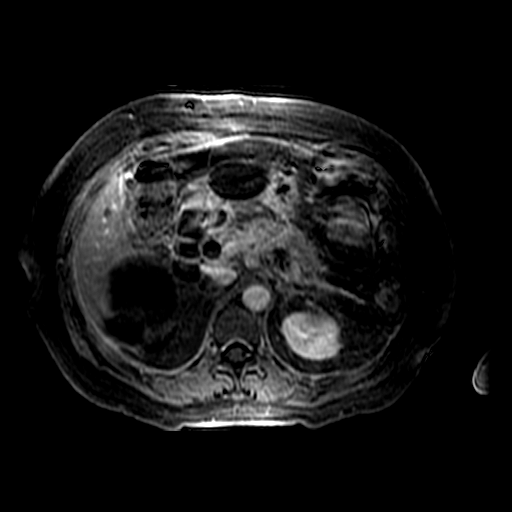
[im 113/364]
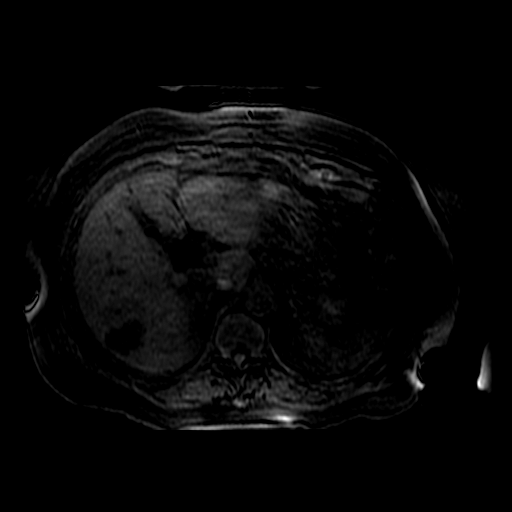
[im 163/364]
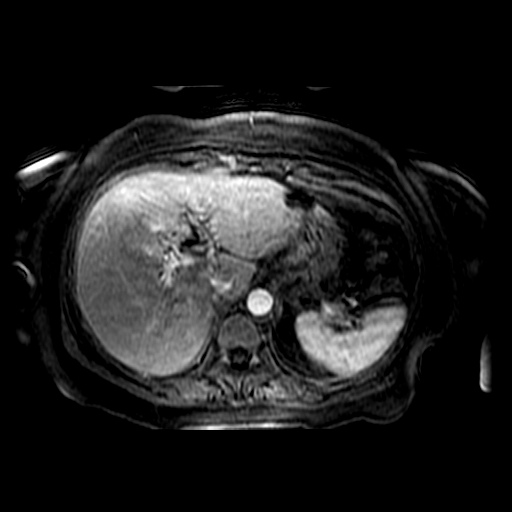
[im 201/364]
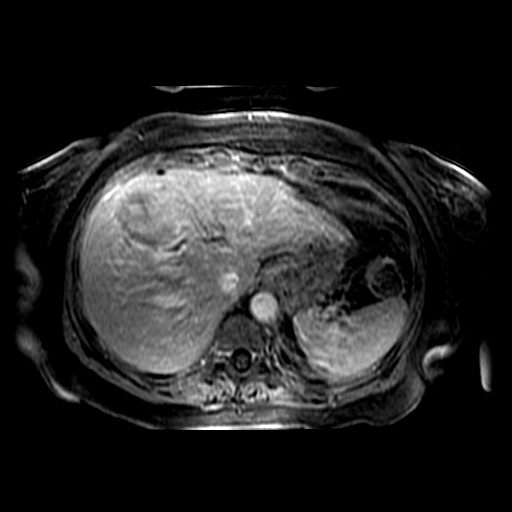
[im 251/364]
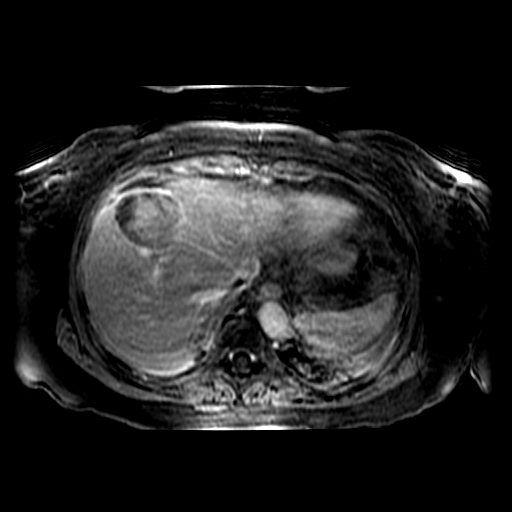
[im 301/364]
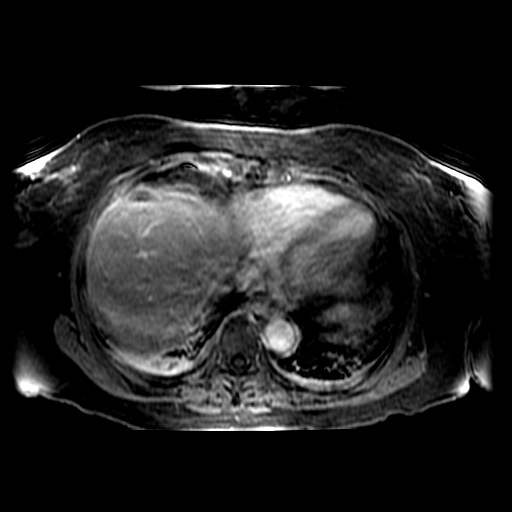

[23 of 48 positions shown; findings below may reference images not displayed]

IMPRESSION: Multiple simple-appearing liver cysts.  There is also a complex 5.0 cm lesion in the right hepatic lobe, which is well-circumscribed and is most likely a hemorrhagic cyst.  Follow-up CT or MRI in 4-6 months is recommended to reevaluate this.  
 Dilated common bile duct.  No evidence for pancreatic head mass.  
 Normal-caliber pancreatic duct and no evidence for a pancreatic divisum.  
 Mild changes of pancreatitis.  
 Large right renal cyst.
 MRCP 
 MRCP was performed using standard protocol.  
 There are two common bile duct stones distally, the largest of which measures approximately 2.0 mm.  There may be a third smaller stone slightly higher in the common bile duct.  No significant intrahepatic biliary dilatation.
IMPRESSION: At least two, and possibly three, small common bile duct stones.
 Dilated common bile duct.
 Normal appearance of the pancreatic duct.

## 2006-09-18 ENCOUNTER — Encounter: Admission: RE | Admit: 2006-09-18 | Discharge: 2006-09-18 | Payer: Self-pay | Admitting: Gastroenterology

## 2007-02-03 ENCOUNTER — Encounter: Admission: RE | Admit: 2007-02-03 | Discharge: 2007-02-03 | Payer: Self-pay | Admitting: Internal Medicine

## 2007-04-13 IMAGING — US US ABDOMEN COMPLETE
1 series · 13 of 25 positions shown · non-contrast
Comparison: none

CLINICAL DATA: Follow-up liver lesion noted on prior ultrasound of 05/23/04 and CT of 01/10/04. 
 ABDOMEN ULTRASOUND:
TECHNIQUE: Complete abdominal ultrasound examination was performed including evaluation of the liver, gallbladder, bile ducts, pancreas, kidneys, spleen, IVC, and abdominal aorta.

[Series 1: unknown · 13 of 83 slices shown]
[im 1/83]
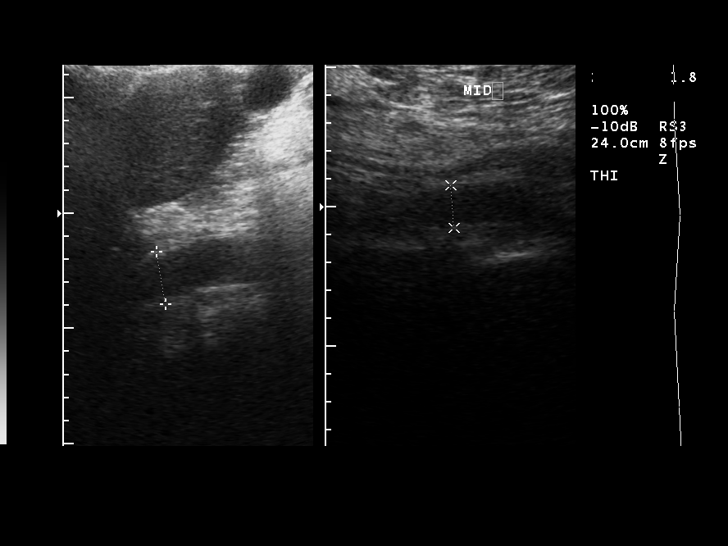
[im 7/83]
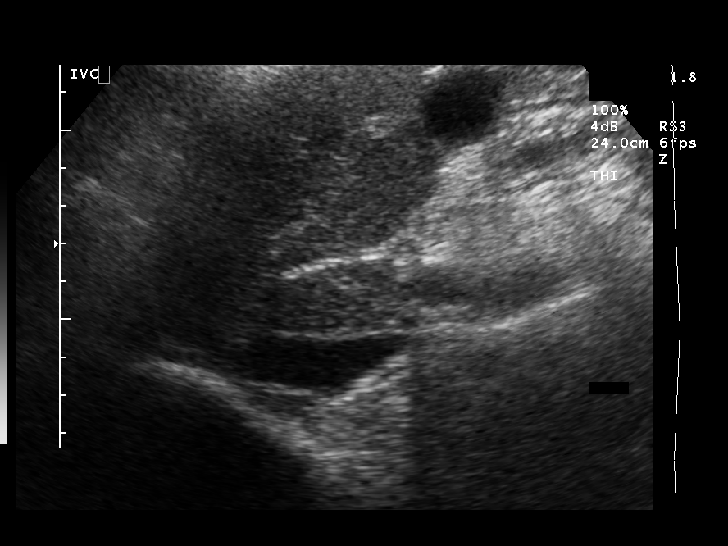
[im 14/83]
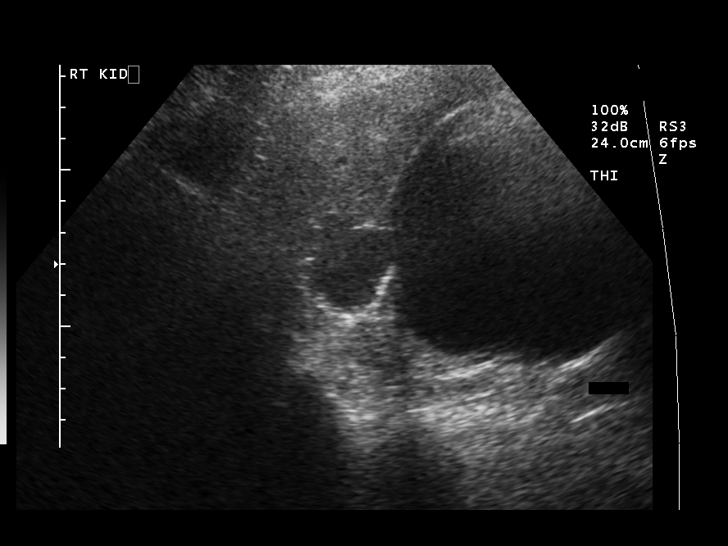
[im 21/83]
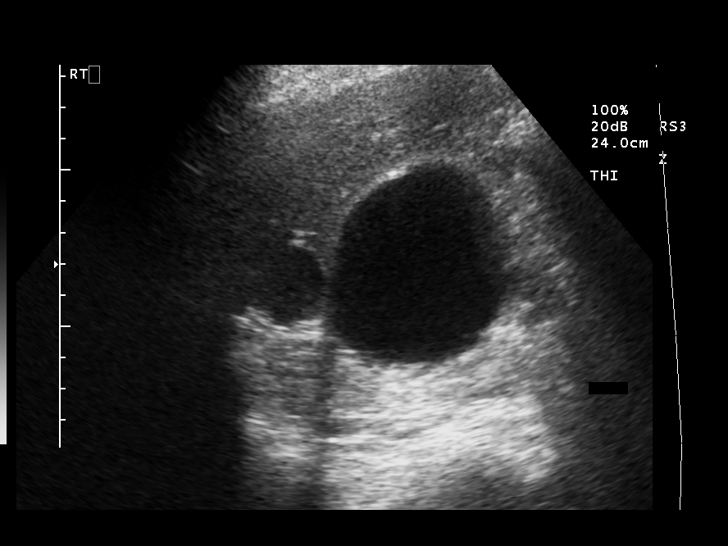
[im 28/83]
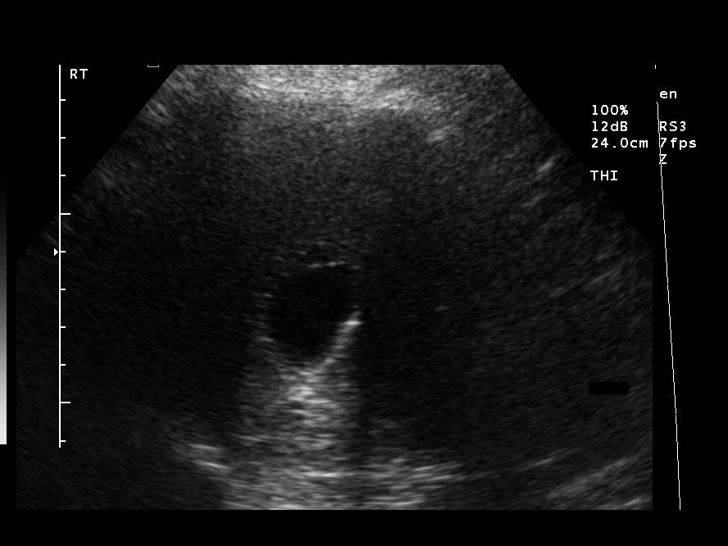
[im 35/83]
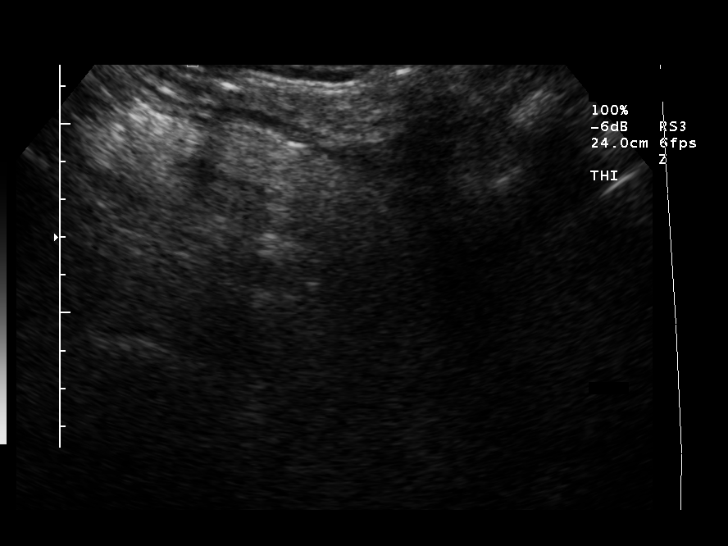
[im 42/83]
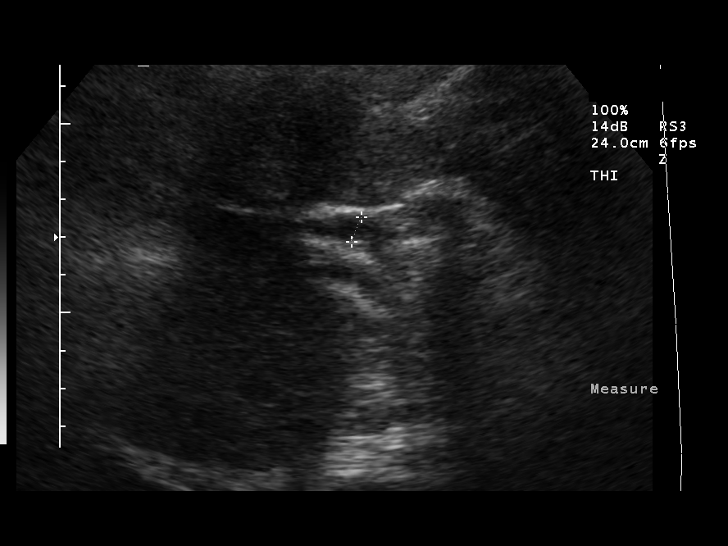
[im 48/83]
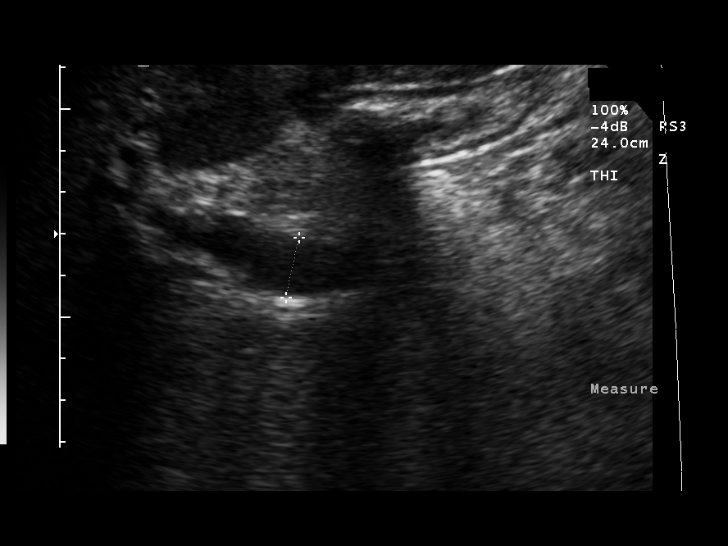
[im 55/83]
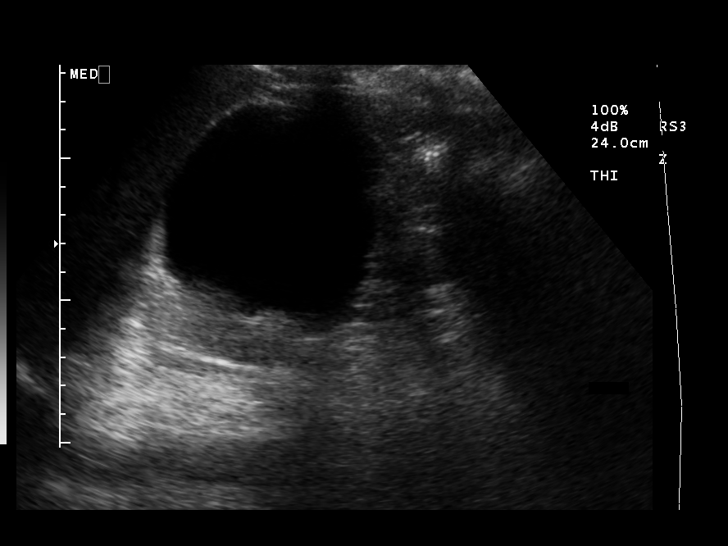
[im 62/83]
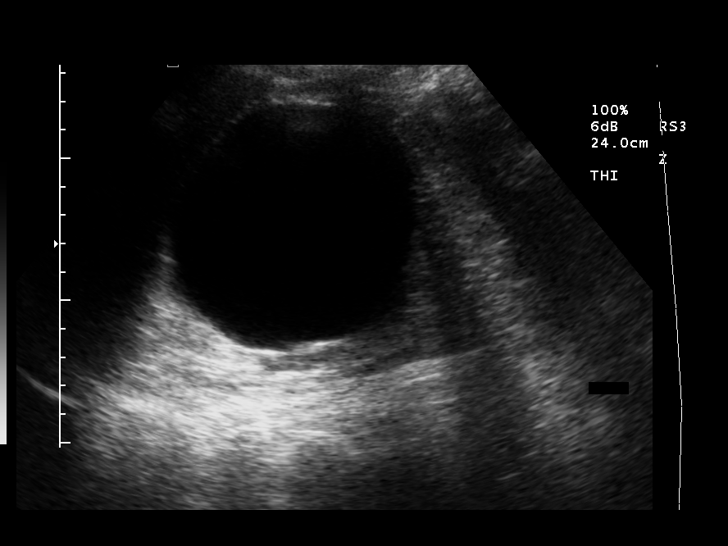
[im 69/83]
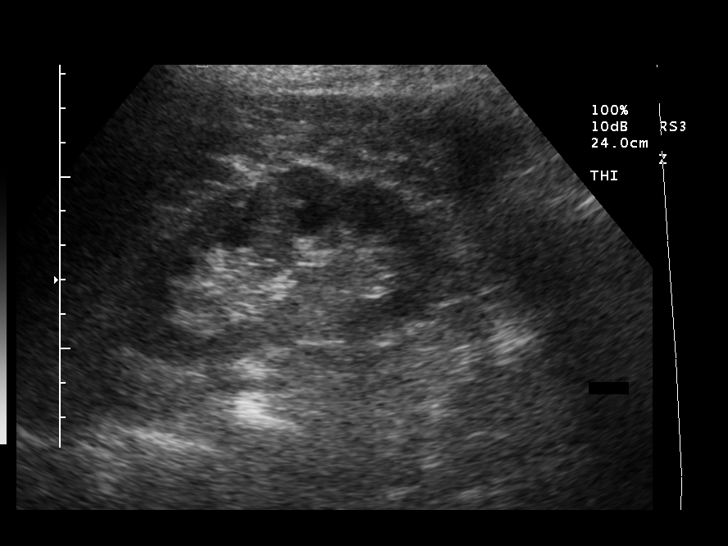
[im 76/83]
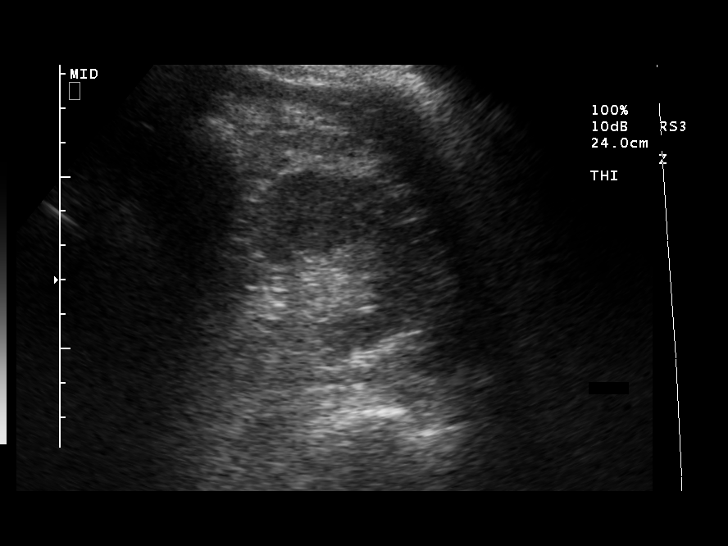
[im 83/83]
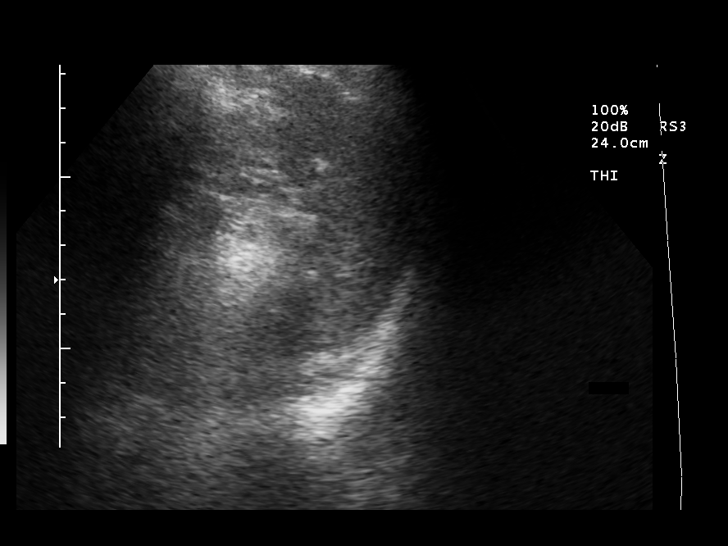

[13 of 25 positions shown; findings below may reference images not displayed]

FINDINGS: The patient has previously undergone cholecystectomy.  The solid nodule in the right lobe of liver has not changed significantly in size measuring 4.8 x 3.8 x 3.8 cm.  Several liver cysts also are noted and unchanged.   Common bile duct is prominent measuring 14.7 mm which may well be normal post cholecystectomy.  Correlation with LFT?s is recommended.  Assessment of the IVC and pancreas is limited by bowel gas with the mid portion of the pancreas appearing normal.  The spleen is normal in size.  No hydronephrosis is seen.  The right kidney measures 11.9 cm sagittally with the left kidney measuring 10.4 cm.  A large cyst emanates from the upper pole of the right kidney measuring 9.4 cm in maximum diameter.   The abdominal aorta is normal in caliber.
IMPRESSION: 1.  Stable solid lesion in the right lobe of the liver measuring 4.8 x 3.8 x 3.8 cm.  No change in several liver cysts as well. 
 2.  Prior cholecystectomy.
 3.  Prominent common bile duct may well be normal post cholecystectomy.  Correlate with LFT?s.

## 2007-08-29 IMAGING — CR DG CHEST 2V
2 series · 2 of 2 positions shown · non-contrast
Comparison: none

CLINICAL DATA: Cough and congestion.  
 CHEST - 2 VIEW:

[w chest pa]
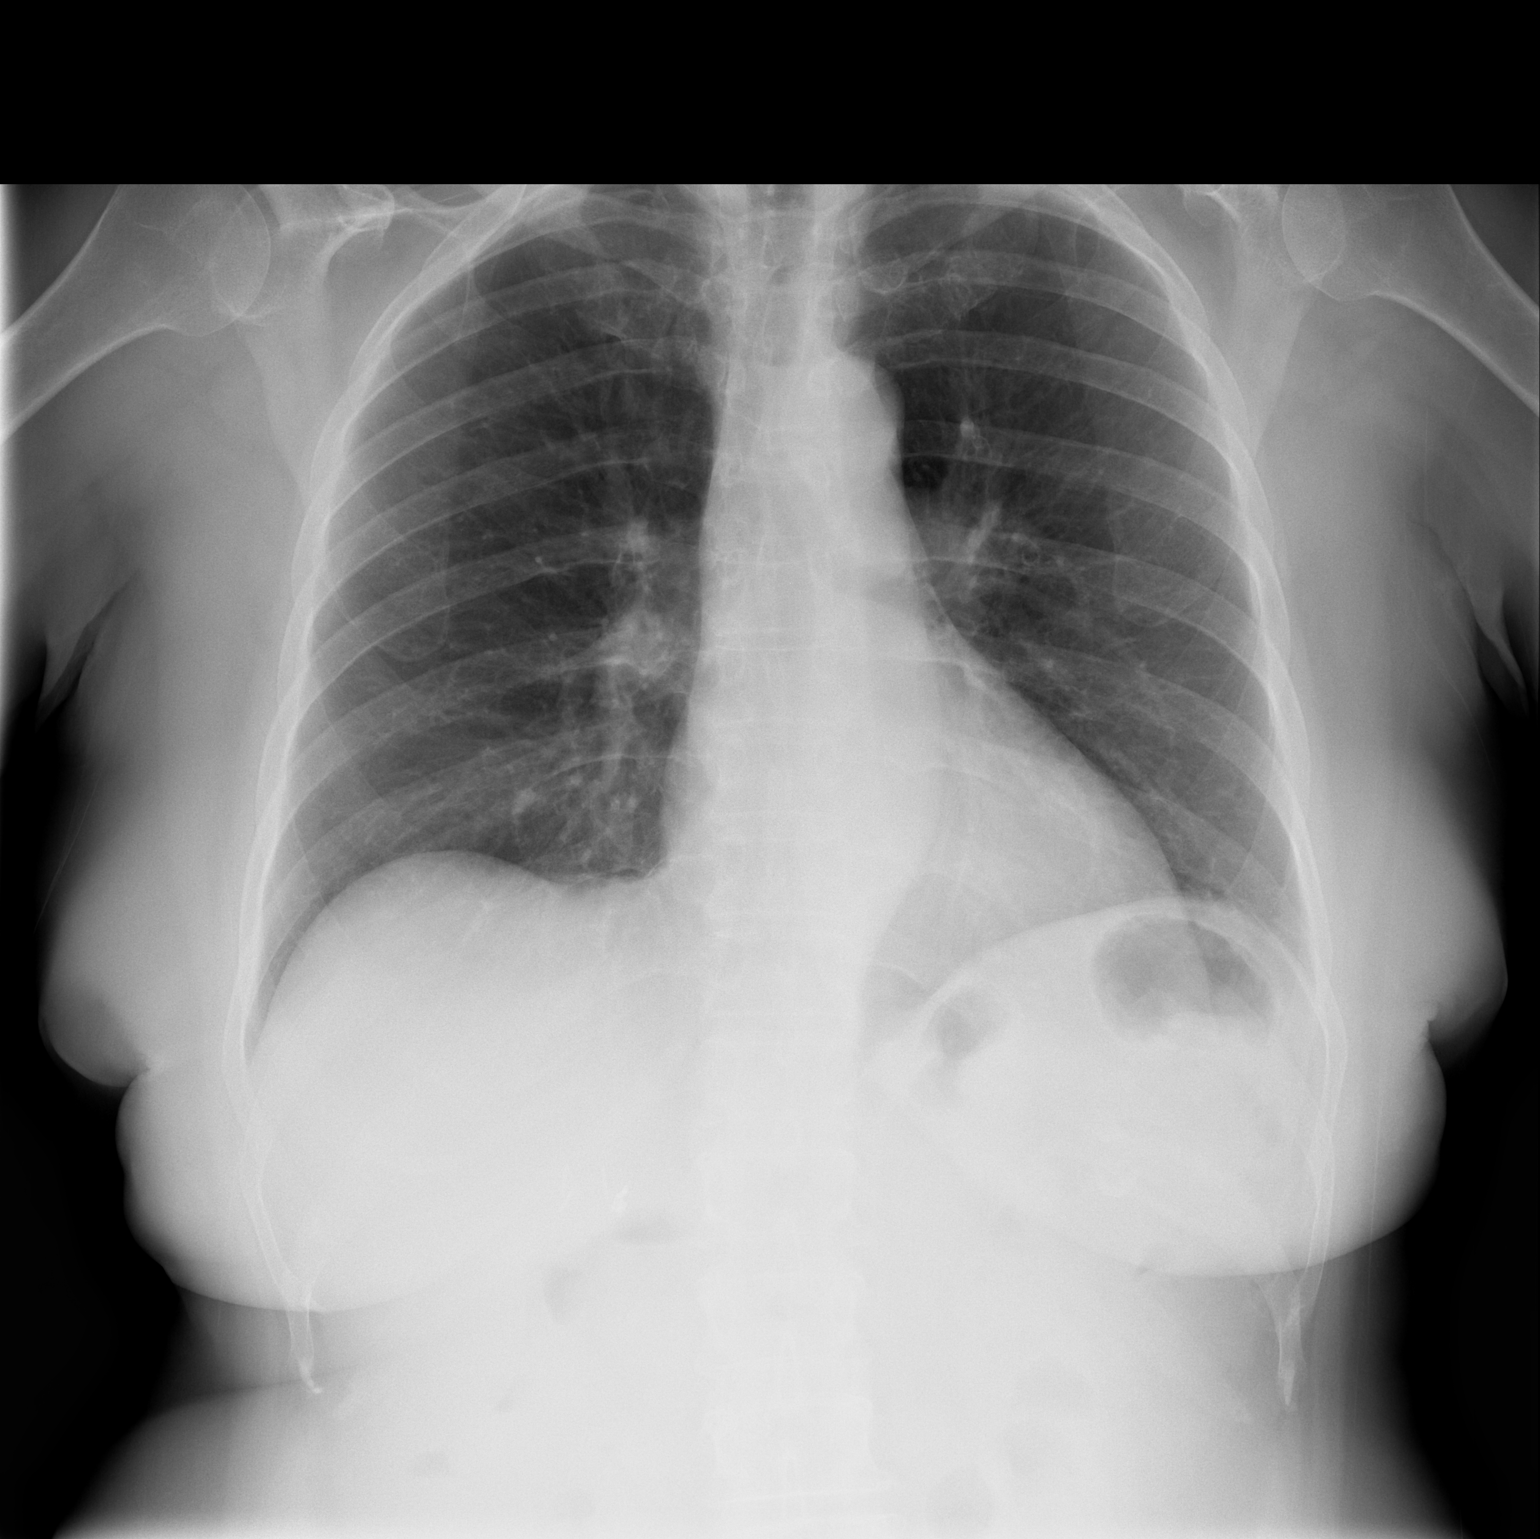

[w chest lat]
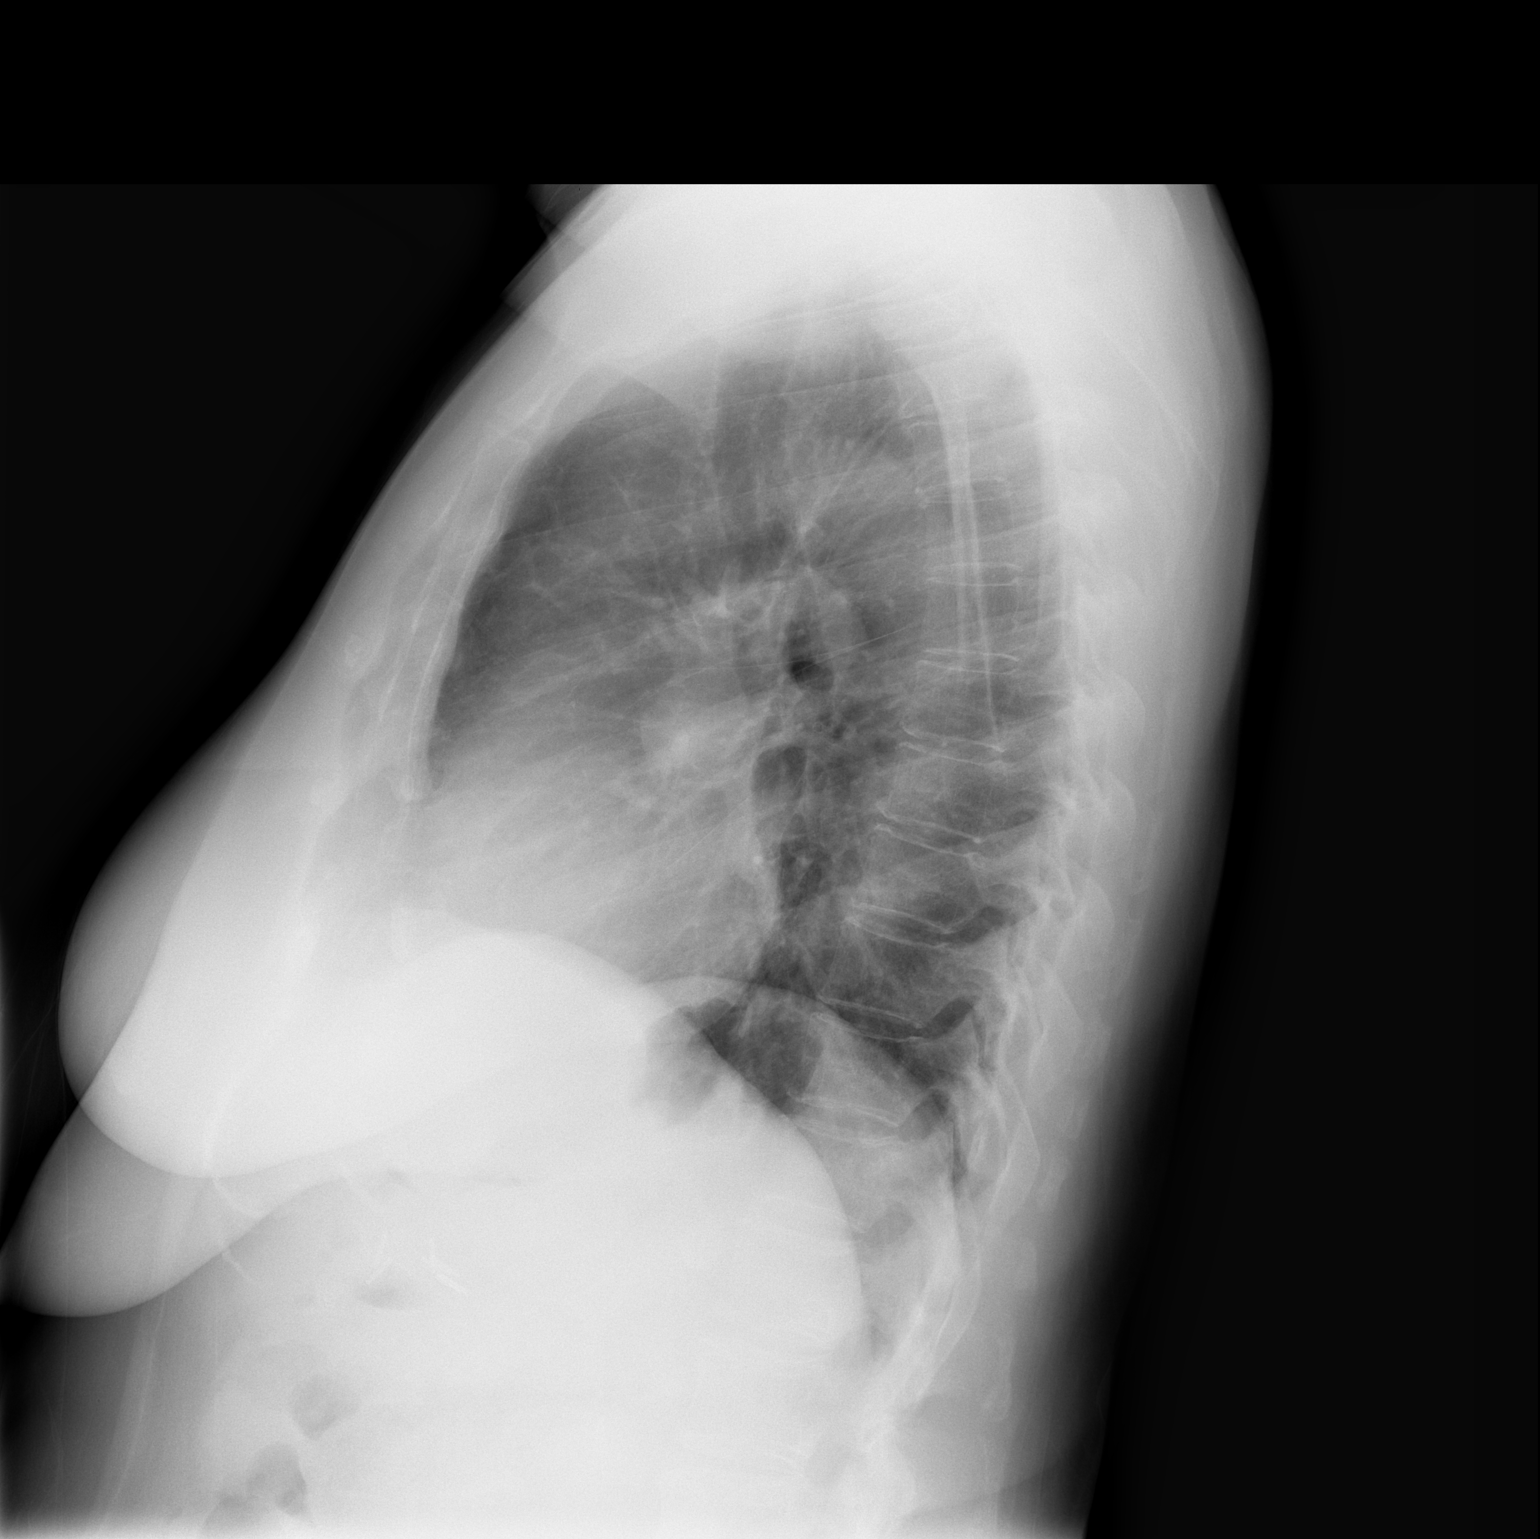

[2 of 2 positions shown; findings below may reference images not displayed]

FINDINGS: Two views of the chest show no active infiltrate or effusion.  The heart is mildly enlarged.  No bony abnormality is seen.
IMPRESSION: No active lung disease.  Mild cardiomegaly.

## 2009-06-15 ENCOUNTER — Encounter: Admission: RE | Admit: 2009-06-15 | Discharge: 2009-06-15 | Payer: Self-pay | Admitting: Internal Medicine

## 2010-06-16 ENCOUNTER — Encounter: Admission: RE | Admit: 2010-06-16 | Discharge: 2010-06-16 | Payer: Self-pay | Admitting: Internal Medicine

## 2011-05-18 NOTE — Discharge Summary (Signed)
NAMEJENILLE, [name]                              ACCOUNT NO.:  [id number]   MEDICAL RECORD NO.:  [id number]                   PATIENT TYPE:  IPS   LOCATION:  4144                                 FACILITY:  [facility]   PHYSICIAN:  Ranelle Oyster, M.D.             DATE OF BIRTH:  May 12, 1929   DATE OF ADMISSION:  12/02/2003  DATE OF DISCHARGE:  12/08/2003                                 DISCHARGE SUMMARY   DISCHARGE DIAGNOSES:  1. Right total hip arthroplasty secondary to osteoarthritis.  2. History of hemachromatosis.  3. Status post urinary tract infection.  4. History of hypertension.  5. History of elevated lipids.   HISTORY OF PRESENT ILLNESS:  The patient is a 77 year old white female  admitted on November 29 with advanced right hip OA with no relief with  conservative care. The patient underwent a right total knee arthroplasty on  November 29 by Dr. Thurston Hole. Placed on Coumadin for DVT prophylaxis. Hospital  course significant for hyperkalemia, dizziness, hypotension, and  dehydration. PT report at this time indicated patient is weight bearing as  tolerated, total assist for bed mobility, transfer min assist, ambulating 55  feet total assist. The patient was transferred to [facility] rehab  department on December 02, 2003 for further therapy.   PRIMARY CARE PHYSICIAN:  Dr. Burton Apley.   PAST MEDICAL HISTORY:  1. Significant for colon cancer.  2. Hypertension.  3. Increased lipids.  4. Hemachromatosis.   PAST SURGICAL HISTORY:  Colon resection.   MEDICATIONS ON ADMISSION:  Lipitor, Maxzide, vitamin E.   ALLERGIES:  None.   SOCIAL HISTORY:  The patient lives with husband in one level home. Was  independent at admission. Is retired. Husband self-employed but can assist.  She has a local aid as needed. There is one-step to entry in her house.   HOSPITAL COURSE:  Ms. [name] was admitted to [facility] rehabilitation  department on December 02, 2003 for comprehensive  patient rehabilitation to  receive more than three hours of therapy daily. Overall, Ms. [name]  progressed very well during her 6-day stay in rehab. She was discharged at  an modified independent level. Surgical incisions healed very well, no signs  of infection. She remained on Coumadin for DVT prophylaxis as well as  Lovenox until Coumadin is therapeutic. Her hospital course while in rehab  was significant for urinary tract infection and hypertension. At the time of  admission, antihypertensive medication on hold secondary to low systolic  blood pressure. The patient did undergo severe pruritus while in rehab,  probably secondary to allergy to __________. She was given a steroid Dosepak  for rash and pruritus. On December 03, 2003, she received ___________ and  Benadryl as needed. The patient did have an anemia but no iron substitute  was added secondary to a history of hyperchromatosis. On December 07, 2003,  she was started on amoxicillin 250  mg p.o. t.i.d. for three days secondary  to enterococcus urinary tract infection. Her pain has been controlled with  Ultram as well as Robaxin for muscle spasms. She had no significant  complications while in rehab. She was discharged at a modified independent  level.   Latest hemoglobin 9.7, hematocrit 28.5, white blood cell count 7.4, platelet  count 240. Latest INR was 2.4, PT 21.4. Latest sodium 140, potassium 3.5,  chloride 106, CO2 30, glucose 108, BUN 11, creatinine 0.8. AST 34, ALT 26.  Latest urine culture showed 100,000 colonies enterococcus species.   At the time of discharge, PT report indicates that patient is able to  ambulate approximately 500 feet modified independent with a rolling walker,  transfer sit to stand modified independent level, bed mobility modified  independent level. Overall she met all goals. She could perform most ADLs  supervision to modified independent level. The patient had hip precautions.  The patient was  discharged home with her family. At the time of discharge,  her vital signs were stable, blood pressure 130/61, respiratory rate 18,  pulse 77, temperature 98.3. Wound staples were still intact. She was to  follow up with Dr. Thurston Hole in a couple of days to remove staples.   DISCHARGE MEDICATIONS:  1. Coumadin 2 mg one tablet p.o. per day.  2. Lipitor 10 mg one p.o. per day.  3. Maxzide 37.5/25 hold for now.  4. Protonix 40 mg one p.o. per day.  5. Prednisone 10 mg one p.o. twice daily on December 8 and December 9 and     one p.o. per day December 10 and December 11.  6. Senokot S two pills at bedtime.  7. Ultram 50 mg one tablet four times a day as needed for pain.  8. Robaxin 0.5 mg four times daily as needed for spasm.  9. Amoxicillin 250 mg one pill three times a day for five doses.   PAIN MANAGEMENT:  With Ultram.   ACTIVITY:  Total hip precautions. Use walker. No drinking alcohol. No  driving. No aspirin products or ibuprofen or Aleve while on Coumadin.   WOUND CARE:  Keep area clean and dry.   FOLLOWUP:  Followup with Dr. Thurston Hole within five days for staple removal.  Followup with primary care physician in two weeks for blood pressure check  and anemia. Followup with dr. Faith Rogue as needed.      Junie Bame, P.A.                       Ranelle Oyster, M.D.    LH/MEDQ  D:  01/03/2004  T:  01/03/2004  Job:  [id number]   cc:   Antony Madura, M.D.  [address]  [place]  [place] [address]  Fax: [phone]

## 2011-05-18 NOTE — Consult Note (Signed)
[name], [name]                              ACCOUNT NO.:  [id number]   MEDICAL RECORD NO.:  [id number]                   PATIENT TYPE:  INP   LOCATION:  5733                                 FACILITY:  [facility]   PHYSICIAN:  Adolph Pollack, M.D.            DATE OF BIRTH:  Jul 11, 1929   DATE OF CONSULTATION:  DATE OF DISCHARGE:                                   CONSULTATION   CONSULTING PHYSICIAN:  Adolph Pollack, M.D.   REASON FOR CONSULTATION:  Biliary pancreatitis.   HISTORY OF PRESENT ILLNESS:  [name] is a 77 year old female who was  admitted this morning with a 24 hour history of severe pressure type  epigastric pain associated with some nausea, vomiting and chills.  She  states about one week prior to this, she had a similar episode, but it was  self limited.  Both of these episodes have been related to meals.  The  episode today included pain that radiated to the back.  She tried to take  some antacids but they did not help her.  She vomited in the emergency  department as well.  She subsequently was admitted.  She was noted to have  laboratory findings consistent with a pancreatitis.   PAST MEDICAL HISTORY:  1. Hemachromatosis.  2. Hypertension.  3. Degenerative joint disease.  4. Sigmoid colon cancer.   PREVIOUS OPERATIONS:  1. Partial colectomy.  2. Right breast biopsy for benign lesion.  3. Total hip replacement, right side.   ALLERGIES:  None known.   HOME MEDICATIONS:  Lipitor.   SOCIAL HISTORY:  She denies any significant alcohol use.  Denies tobacco  use.   FAMILY HISTORY:  Denies a family history of gallbladder disease.   REVIEW OF SYSTEMS:  CARDIOVASCULAR:  She denies any coronary artery disease  or dysrhythmia.  GI:  She denies hepatitis, peptic ulcer disease, or reflux  type symptoms.  She does report having problems with hemorrhoids that  intermittently bleed.  GU:  She has had a recent urinary tract infection  treated during her hip  surgery.  Denies kidney stones.  ENDOCRINE:  No  diabetes or thyroid disease.  NEUROLOGIC:  No strokes or seizures.  HEMATOLOGIC:  She denies any known bleeding disorders or deep vein  thrombosis.  She had been on empiric Coumadin following her hip surgery, but  she stopped that approximately five days ago.   PHYSICAL EXAMINATION:  GENERAL:  An elderly female who is in no acute  distress.  She is very pleasant and cooperative.  VITAL SIGNS:  Included a temperature this morning of 97.6.  Blood pressure  was 120/65, pulse was 82 and respiratory rate was 20 with an oxygen  saturation of 98%.  SKIN:  Warm and dry.  No jaundice.  HEENT:  Normocephalic, atraumatic.  EOMI.  No icterus.  NECK:  Supple without palpable masses or obvious thyroid enlargement.  RESPIRATORY:  The breath sounds were equal and clear and the respirations  are non-labored.  CARDIOVASCULAR:  Heart demonstrates a regular, rate and rhythm.  No obvious  murmur heard.  No JVD noted.  ABDOMEN:  Soft.  There is a midline scar present.  In the periumbilical  region, there is a palpable fascial defect with reducible contents.  There  is some mild right upper quadrant, right flank and epigastric tenderness to  palpation but no percussion tenderness or peritoneal signs.  Active bowel  sounds are noted.  MUSCULOSKELETAL:  There is a healing right lateral thigh scar present.  No  significant edema noted.   LABORATORY DATA:  Remarkable for a lipase of 3,669, amylase 2,041.  Albumin  3.2.  Liver function tests are not elevated.  White blood cell count is  7,600 with a slight leftward shift, hemoglobin 10.0.   CT scan was reviewed.  This demonstrates multiple cysts in the liver, one  being a little bit inhomogeneous.  There is also a cyst in the kidney.  There is peripancreatic inflammation and free fluid around the pancreas and  gallbladder as well.  A small stone seen in the gallbladder.  The common  bile duct is significantly  dilated at 14- to -15-mm with a question of  whether there may be a distal common bile duct stone.   Abdominal ultrasound was performed, demonstrating multiple small gallstones  and a question, again, of whether there may be a distal common bile duct  stone.  The common bile duct __________ is a little bit smaller with the  ultrasound done.   IMPRESSION:  Acute pancreatitis, most likely secondary to biliary disease,  and she probably has passed a stone.  There is a small possibility that she  may have a small distal common bile duct stone, although liver function  tests are normal.  At this point in time, she will only appears to have one  Ranson criteria and that would be her age.  She does not appear to have any  significant co-morbid medical problems.  She also has a periumbilical small  ventral incisional hernia and according to computed tomography scan has a  little bit of colon up into it, although this is easily reducible.   RECOMMENDATIONS:  I agree with bowel rest and symptomatic treatment.  I  agree with following her clinical course and rechecking labs.  I did discuss  with her the indication for a laparoscopic cholecystectomy.  We went over  the procedure and the risks including but not limited to bleeding,  infection, common bile duct injury, hepatic injury and bile leak, small  intestinal injury, problems with general anesthesia and post cholecystectomy  diarrhea.  She and her family members seem to understand this.  She is  somewhat adamant about having Dr. Sheppard Plumber. Earlene Plater perform her operation, as  he has operated on her in the past and her husband.  I told her we would  wait and see if the pancreatitis calmed down, and I would discuss the timing  of her operation with Dr. Earlene Plater.                                               Adolph Pollack, M.D.   Kari Baars  D:  12/31/2003  T:  12/31/2003  Job:  [id number]   cc:  Antony Madura, M.D.  [address]  [place]  [place] [address]  Fax: [phone]   Llana Aliment. Malon Kindle., M.D.  [address]  [place]  [place] [address]  Fax: [phone]   Althea Grimmer. Luther Parody, M.D.  [address]  [place]  [place] [address]  Fax: [phone]

## 2011-05-18 NOTE — H&P (Signed)
NAMESAMORA, JERNBERG NO.:  [id number]   MEDICAL RECORD NO.:  [id number]                   PATIENT TYPE:  EMS   LOCATION:  MAJO                                 FACILITY:  [facility]   PHYSICIAN:  Elliot Cousin, M.D.                 DATE OF BIRTH:  May 11, 1929   DATE OF ADMISSION:  12/31/2003  DATE OF DISCHARGE:                                HISTORY & PHYSICAL   PRIMARY CARE PHYSICIAN:  Dr. Antony Madura.   GASTROENTEROLOGIST:  Dr. Llana Aliment. Edwards.   CHIEF COMPLAINT:  Diffuse abdominal pain, nausea and vomiting.   HISTORY OF PRESENT ILLNESS:  Ms. Hoheisel is a 77 year old lady with a past  medical history significant for status post right total hip replacement in  November of 2004 per Dr. Elana Alm. Wainer, hemochromatosis, and  hypertension, who presented to the emergency department this morning with a  24-hour history of diffuse abdominal pain and abdominal bloating.  The  patient actually said that the symptoms started approximately four weeks ago  with gas pains, however, the pain subsided over the course of four weeks  until yesterday.  The patient describes the pain as cramping and now  constant.  There is some radiation to the back.  She does have more  discomfort in the upper abdomen than she does in the lower abdomen.  Her  symptoms started after eating chicken noodle soup yesterday.  She took  Pepcid and Maalox but they did not help.  The nausea and vomiting started  several hours ago en route to the hospital.  She also had one episode of  nausea and vomiting in the emergency department, yielding approximately 300  mL of a bilious fluid.  She had had no history of coffee-grounds emesis and  no history of gross blood in her emesis.  The patient has also had  associated fever and chills.  She has not had any diarrhea or constipation,  however, she has seen some bright red blood when wiping with toilet paper.  She has a history of hemorrhoids and  recently stopped Coumadin, yesterday;  she was given Coumadin as prophylactic treatment for her right hip  replacement.  She denies any painful urination, however, she does state that  she was treated for a urinary tract infection while in the hospital four  weeks ago.   REVIEW OF SYSTEMS:  Her review of systems is positive for a dry cough which  actually transformed into a productive cough with yellow sputum, however,  she denies any other upper respiratory infection symptoms.  She denies any  night sweats, however, she has had some subjective fever and chills at home.  She denies any headache, visual changes, dysphagia, neck pain, chest pain,  shortness of breath, swelling in her legs and right hip pain.   REVIEW OF SYSTEMS:  Her review of systems is positive, as  above, in the  history of present illness.   When the patient was evaluated in the emergency department by Dr. Carlisle Beers.  Molpus, she was found to have a lipase of 3669 and an amylase of 2041.  The  CT scan of the abdomen and pelvis revealed severe pancreatitis, gallstones  with a dilated common bile duct of approximately 14 mm.  She is currently  afebrile with a temperature of 97.6 and her white blood cell count was  within normal limits at 7.6.  The patient will therefore be admitted for  management of acute pancreatitis.  The patient does not drink alcohol.   PAST MEDICAL HISTORY:  1. Hypertension.  2. Hemochromatosis diagnosed in 1999 per Dr. Jillyn Hidden B. Sherrill, status post     multiple phlebotomies in the past.  3. Colon cancer in 1996, status post rectosigmoid resection.     a. Colonoscopy in August of 2004 per Dr. Randa Evens revealed no polyps.  4. Hemorrhoids.  5. Status post right total hip replacement per Dr. Elana Alm. Wainer in     November of 2004.  Completed Coumadin therapy yesterday.  6. Status post right breast lumpectomy in 1991, benign.  7. Hyperlipidemia.   MEDICATIONS:  1. Lipitor 10 mg daily.  2.  Glucosamine 1500 mg one to two pills daily.  3. Senokot one to two pills daily.  4. Triamterene/hydrochlorothiazide 75/50 mg a half a tablet daily.  5. Ultram one tablet -- 50 mg -- four times daily p.r.n. pain.  6. Coumadin -- four weeks of therapy discontinued yesterday.   ALLERGIES:  No known drug allergies.   FAMILY HISTORY:  The patient's mother died at 57 years of age secondary to a  stroke and heart attack.  Her father died at 27 years of age secondary to  old age, however, her father did have a history of coronary artery disease  and was status post CABG.   SOCIAL HISTORY:  The patient is married and lives in [place] with  her husband.  She is retired from [employer].  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  She is relatively active.  She has four children.  She denies tobacco and alcohol use.  She can read  and write.   EXAM:  VITAL SIGNS:  Temperature 97.6, blood pressure 147/72, pulse 92,  respiratory rate 20, oxygen saturation 98% on room air.  GENERAL:  The patient is a pleasant, mildly overweight Caucasian woman who  is 77 years of age, currently lying in bed in no acute distress.  She is  status post Zofran and Dilaudid.  HEENT:  Head is normocephalic and nontraumatic.  Pupils are equal, round and  reactive to light.  Extraocular movements are intact.  Conjunctivae are  clear.  Sclerae are white.  Tympanic membranes are clear bilaterally.  Nasal  mucosa is moist.  No drainage.  No sinus tenderness of the maxillary sinuses  bilaterally.  Oropharynx reveals fairly good dentition, although she does  have some missing lower teeth.  Her mucous membranes are moist.  No  posterior exudates or erythema.  NECK:  Neck is supple.  No adenopathy, no thyromegaly, no bruit.  LUNGS:  Lungs are clear to auscultation bilaterally.  HEART:  S1 and S2 with no murmurs, rubs, or gallops. BREASTS:  No breast masses palpated bilaterally.   ABDOMEN:  There is a well-healed longitudinal scar of the abdomen.  Her  abdomen is obese.  Bowel sounds  are hypoactive.  Her abdomen is soft and  mildly to moderately tender in the epigastric region, although she does have  some discomfort diffusely.  No rebound, no guarding, no rigidity.  No  appreciable mass is palpated.  RECTAL:  The patient has multiple external hemorrhoids which are non-  bleeding.  She has a scant amount of light-brown stool in her rectal vault  which is guaiac-positive.  No mass is palpated within the rectal vault.  GU:  Deferred.  EXTREMITIES:  The patient has a well-healed right hip scar.  She has mild  limitation in the external and internal rotation of her right hip, however,  this is minimal.  She has good range of motion of all of her other muscle  groups.  She has a trace of pedal edema bilaterally.  She has multiple  varicosities of her lower extremities bilaterally.  Her pedal pulses are  palpable at 1+.  NEUROLOGIC:  The patient is alert and oriented x3.  Cranial nerves II-XII  are intact.  Strength is 5/5 throughout.  Sensation is intact to soft touch.   ADMISSION LABORATORIES:  CT scan of the abdomen and pelvis reveals severe  pancreatitis, no necrosis, no abscess.  Gallstones are present with a  dilated common bile duct of 14 mm.  There are multiple hepatic cysts and one  complex cyst with an increased density.  There is a large right renal cyst,  perihepatic and pericholic fluid; 2-mm splenic aneurysm.   Amylase 2041, lipase 3669.  Sodium 140, potassium 3.3, chloride 108, BUN 13,  glucose 131, total protein 5.9, albumin 3.2, AST 34, ALT 20, alkaline  phosphatase 112, total bilirubin 0.7, direct bilirubin 0.1, indirect  bilirubin 0.6.  WBC 7.6, hemoglobin 10.0, hematocrit 30.3%, MCV 93.2,  platelets 209,000.   ASSESSMENT:  1. Severe pancreatitis.  This is most likely secondary to cholelithiasis.     The patient also has an elevated common bile  duct but she does not appear     to have choledocholithiasis at this time per CT scan.  The patient is     currently afebrile and she does not have an elevated white blood cell     count.  2. Multiple hepatic cyst, one complex with possible density.  This is a     radiographic incidental finding.  3. Large right renal cyst per CT scan.  Again, this appears to be an     incidental finding.  Her creatinine is 0.8 and her renal function appears     to be within normal limits.  4. Hypokalemia.  This is probably secondary to the nausea and vomiting and     possibly her antihypertensive medication.  5. Incidental finding of a splenic aneurysm measuring 2 mm per CT scan.  6. Normocytic anemia with a hemoglobin of 10.0.  The patient has a history     of hemochromatosis, however, her hemoglobin is currently low.  She has     had some on-and-off hematochezia secondary to bleeding from her    hemorrhoids and recent Coumadin therapy.  Her colonoscopy in August of     this year per Dr. Randa Evens was within normal limits.   PLAN:  1. The patient received IV fluids, Dilaudid and Phenergan in the emergency     department per Dr. Read Drivers.  2. The patient will be treated with morphine 2 to 5 mg IV q.3 h. as needed     for pain, as well as Reglan 5 mg  IV q.6 h.  We will also add Phenergan     12.5 mg IV every 4 hours if needed for nausea.  3. IV fluids will be instituted with D-5 normal saline with 30 mEq of     potassium chloride at 125 mL an hour.  4. We will hold on antibiotic treatment for now but will monitor the     patient's temperature curve.  5. We will monitor the patient's amylase and lipase over the next few days,     as well as her liver function tests.  6. We will consult physician on call for Dr. Randa Evens for a gastroenterology     consultation.  Consider surgery consultation if needed.  7. Empiric Protonix at 40 mg IV daily.  8. Obtain an ultrasound of the abdomen for further evaluation.   9. Consider starting antibiotics if the patient develops a fever.                                                Elliot Cousin, M.D.    DF/MEDQ  D:  12/31/2003  T:  12/31/2003  Job:  [id number]   cc:   Antony Madura, M.D.  [address]  [place]  [place] [address]  Fax: [phone]   Llana Aliment. Malon Kindle., M.D.  [address]  [place]  [place] [address]  Fax: [phone]

## 2011-05-18 NOTE — Discharge Summary (Signed)
NAMETAMIA, DIAL                              ACCOUNT NO.:  [id number]   MEDICAL RECORD NO.:  [id number]                   PATIENT TYPE:  INP   LOCATION:  5041                                 FACILITY:  [facility]   PHYSICIAN:  Elana Alm. Thurston Hole, M.D.              DATE OF BIRTH:  August 05, 1929   DATE OF ADMISSION:  11/29/2003  DATE OF DISCHARGE:  12/02/2003                                 DISCHARGE SUMMARY   ADMISSION DIAGNOSES:  1. End-stage degenerative joint disease right hip.  2. High cholesterol.  3. Hypertension.   DISCHARGE DIAGNOSES:  1. End-stage degenerative joint disease  right hip.  2. High cholesterol.  3. Hypertension.  4. Hypokalemia.  5. Orthostatic hypotension.   HISTORY OF PRESENT ILLNESS:  The patient is a 77 year old female who has a  history of end-stage DJD of her right hip.  She has tried anti-  inflammatories and articular cortisone injections all without success.  At  this point in time, she has pain at night, pain at rest, pain with activity.  She understands the risks, benefits, and possible complications of a right  total hip replacement and is without questions.   PROCEDURES IN HOUSE:  On November 29, 2003, the patient underwent a right  total hip replacement by Dr. Thurston Hole.  She tolerated the procedure well.   HOSPITAL COURSE:  She was admitted postoperatively for pain control, DVT  prophylaxis, and physical therapy.  Postop day #1, her hemoglobin was 10.9,  her INR was 1.2.  She was metabolically stable.  Her surgical wounds were  well approximated with moderate drainage.  She was not given iron due to her  hemachromatosis.  Her PCA was discontinued.  She was started on formal  physical therapy.  She was given Percocet for pain.  Postop day #2 she  progressed well on physical therapy, surgical wound was well approximated.  She did have a small amount of serous drainage.  Temperature was 99.3.  Postop day #3, her potassium was 3, her hemoglobin was 9.9.   The patient had  trouble with dizziness during physical therapy.  She was given K-Dur 40 mEq  p.o. b.i.d. today for her hypokalemia.  She was transferred to rehab in  stable condition.  We will continue to follow her on rehab.  Her regular  medical doctor, Dr. Zenaida Deed, will see her for her dizziness.  She is  weightbearing as tolerated on a regular diet and being transferred to rehab  in stable condition.      Kirstin Shepperson, P.A.                  Robert A. Thurston Hole, M.D.    KS/MEDQ  D:  01/08/2004  T:  01/09/2004  Job:  [id number]

## 2011-05-18 NOTE — Procedures (Signed)
[facility]  Patient:    [name], [name]                           MRN: [id number] Proc. Date: 05/14/00 Adm. Date:  [id number] Disc. Date: [id number] Attending:  Orland Mustard CC:         Leighton Roach. Truett Perna, M.D.             Antony Madura, M.D.             Timothy E. Earlene Plater, M.D.                           Procedure Report  PROCEDURE:  Colonoscopy.  ENDOSCOPIST:  Llana Aliment. Edwards, M.D.  MEDICATIONS:  Fentanyl 75 mcg and Versed 5 mg IV.  INDICATION:  Patient has had a previous rectosigmoid cancer resected in 1996. This is done as three-year followup colonoscopy.  DESCRIPTION OF PROCEDURE:  Procedure had been explained to patient and consent obtained.  With patient in the left lateral decubitus position, the adult Olympus video colonoscope was inserted and advanced under direct visualization.  Prep excellent.  We were able to advance to the cecum without difficulty.  Scope withdrawn; cecum, ascending colon, hepatic flexure, transverse colon, splenic flexure, descending and sigmoid colon were seen well.  No polyps and no significant diverticular disease.  Internal hemorrhoids were seen in the rectum upon removal of the scope that were small. Scope withdrawn.  Patient tolerated the procedure well.  ASSESSMENT:  No evidence of further polyps or cancer.  PLAN:  Will recommend repeating in three years. DD:  05/14/00 TD:  05/16/00 Job: [id number]

## 2011-05-18 NOTE — Op Note (Signed)
   NAMETALYN, Sheila                              ACCOUNT NO.:  [id number]   MEDICAL RECORD NO.:  [id number]                   PATIENT TYPE:  AMB   LOCATION:  ENDO                                 FACILITY:  [facility]   PHYSICIAN:  James L. Malon Kindle., M.D.          DATE OF BIRTH:  29-Aug-1929   DATE OF PROCEDURE:  08/30/2003  DATE OF DISCHARGE:                                 OPERATIVE REPORT   PROCEDURE PERFORMED:  Colonoscopy.   ENDOSCOPIST:  Llana Aliment. Edwards, M.D.   MEDICATIONS:  Fentanyl 40 mcg, Versed 4 mg IV.   INSTRUMENT USED:  Pediatric Olympus video colonoscope.   INDICATIONS FOR PROCEDURE:  The patient had a rectosigmoid cancer resected  in 1996.  This is done as a second three-year follow-up procedure.   DESCRIPTION OF PROCEDURE:  The procedure had been explained to the patient  and consent obtained.  With the patient in the left lateral decubitus  position, the Olympus scope was inserted and advanced.  The prep was  excellent.  We were able to easily reach the cecum without difficulty.  The  ileocecal valve and appendiceal orifice were seen.  The scope was withdrawn  and the cecum, ascending colon, hepatic flexure, transverse colon, splenic  flexure, descending and sigmoid colon were seen well.  No polyps seen.  There was a scar from previous anastomosis in the sigmoid colon.  The rectum  was free of polyps.  The scope was withdrawn.  The patient tolerated the  procedure well.   ASSESSMENT:  No evidence of polyps in this woman who has had previous  sigmoid colon cancer.   PLAN:  Will recommend yearly Hemoccults and repeating her procedure in five  years since she had two procedures three years apart that are negative.  If  her stools are positive, we will certainly go ahead and push that up.                                               James L. Malon Kindle., M.D.    Waldron Session  D:  08/30/2003  T:  08/30/2003  Job:  [id number]   cc:   Jillyn Hidden B. Truett Perna, M.D.  [address]  [place]  [place]  [address]  Fax: [phone]   Antony Madura, M.D.  [address]  [place]  [place] [address]  Fax: [phone]   Sheppard Plumber. Earlene Plater, M.D.  [address] [place]  [place] [address]  Fax: [phone]

## 2011-05-18 NOTE — Discharge Summary (Signed)
NAMESIMAYA, York                              ACCOUNT NO.:  [id number]   MEDICAL RECORD NO.:  [id number]                   PATIENT TYPE:  INP   LOCATION:  5730                                 FACILITY:  [facility]   PHYSICIAN:  Hettie Holstein, D.O.                 DATE OF BIRTH:  10/12/1929   DATE OF ADMISSION:  12/31/2003  DATE OF DISCHARGE:  01/14/2004                                 DISCHARGE SUMMARY   Primary care physician is Dr. Su Hilt.  Gastroenterologist is Dr. Randa Evens.   ADMISSION DIAGNOSES:  1. Abdominal pain.  2. Severe nausea and vomiting.  3. Severe pancreatitis.  4. Cholelithiasis per CT scan and dilated common bile duct per CT scan.  5. Hepatic and renal cysts per CT scan.  6. Hypokalemia.  7. History of hemochromatosis.   DISCHARGE DIAGNOSES:  1. Abdominal pain.  2. Severe nausea and vomiting.  3. Severe pancreatitis.  4. Cholelithiasis per CT scan and dilated common bile duct per CT scan.  5. Hepatic and renal cysts per CT scan.  6. Hypokalemia.  7. History of hemochromatosis.  8. Acute biliary pancreatitis, status post laparoscopic cholecystectomy,     operative cholangiogram, and repair of umbilical hernia per Timothy E.     Earlene Plater, M.D., on January 12, 2004.  9. Hemoccult-positive stool.  Upon evaluation of Dr. Luther Parody, it was     recommended that she undergo outpatient follow-up gastrointestinal     evaluation.  10.      Hemochromatosis.  11.      Hepatic cyst.  12.      History of colon cancer, diagnosis 1996.   PROCEDURES PERFORMED THIS ADMISSION:  Laparoscopic cholecystectomy as stated  above.   CONSULTATIONS:  1. Timothy E. Earlene Plater, M.D.  2. Gastroenterology, Althea Grimmer. Luther Parody, M.D.   DISCHARGE MEDICATIONS:  1. Claritin 10 mg p.o. daily.  2. Lopressor 12.5 mg one p.o. b.i.d. x1 weeks, then discontinue.  3. Lipitor 10 mg p.o. daily.  4. Albuterol metered-dose inhaler two puffs q.i.d.  5. HCTZ 25 mg p.o. daily, to resume in one week.  6.  Incentive spirometry.  7. Cipro 250 mg one tablet p.o. b.i.d. x5 days.  8. Ultram as previously prescribed.   DISPOSITION:  The patient is instructed to follow up on January 18, 2004.  Instructed to follow up with Dr. Randa Evens or Luther Parody in two weeks and Dr.  Earlene Plater three weeks, and she was instructed to call for an appointment at [phone].   Her diet is as tolerated.   HISTORY OF PRESENT ILLNESS:  Briefly, [name] is a 77 year old lady with  past medical history significant for status post right total hip replacement  in November 2004 by Elana Alm. Thurston Hole, M.D., hemochromatosis, and  hypertension, who presented to the emergency department this morning with a  24-hour history of diffuse abdominal pain and abdominal bloating.  The  patient actually said the symptoms started approximately four weeks ago with  gas pains; however, the pain subsided over the course of four weeks until  yesterday.  The patient describes the pain as crampy and now constant.  There is some radiation to her back.  She does have more discomfort in her  abdomen than she does in her lower back.  Her symptoms started after eating  chicken noodle soup.  Previously she took Pepcid and Maalox, but they did  not help.  The nausea and vomiting started several hours ago.  On arrival to  the hospital she also had one episode of nausea and vomiting in the  emergency department yielding approximately 300 mL of bilious fluid.  She  had no history of coffee-grounds emesis, no history of gross blood in her  emesis.  The patient has had associated fever and chills.  She has not had  any diarrhea or constipation; however, she has seen some bright red blood on  wiping with toilet paper.  She has a history of hemorrhoids and recently  stopped Coumadin the day prior.  She was given Coumadin as a prophylactic  treatment for her right hip replacement.  She denied any painful urination;  however, she did state that she was treated  for a urinary tract infection  while in the hospital four weeks prior.   HOSPITAL COURSE:  The patient was admitted.  Surgery was immediately  consulted for evaluation.  The patient was placed on antibiotics.  She was  placed on bowel rest, IV Unasyn.  She initially had elevated amylase and  lipase.  It was felt by the surgical service that she had bronchitis and  that she could not proceed with OR, and she stayed in the hospital from the  31st to the 12th, where surgery was undertaken.  She eventually went to the  operating room, underwent cholecystectomy and intraoperative cholangiogram.  Findings were consistent with chronic cholecystitis, common duct stones, and  incarcerated umbilical hernia and a mass of the dome of the right liver.  She underwent MRI evaluation of this lesion of the liver.  It is felt to be  benign, well-encapsulated, and with recommendations for follow-up MRI in six  months.  It  was also noted that there were retained stones on the  cholangiogram, and ERCP was planned.  She underwent ERCP and stone  extraction and was discharged home in a stable condition with  recommendations for follow-up with Dr. Su Hilt as noted above.                                                Hettie Holstein, D.O.    ESS/MEDQ  D:  03/14/2004  T:  03/16/2004  Job:  [id number]   cc:   Antony Madura, M.D.  [address], [address]  [place]  [place] [address]  Fax: [phone]

## 2011-05-18 NOTE — Op Note (Signed)
NAMEDELANO, SCARDINO                              ACCOUNT NO.:  [id number]   MEDICAL RECORD NO.:  [id number]                   PATIENT TYPE:  INP   LOCATION:  5041                                 FACILITY:  [facility]   PHYSICIAN:  Elana Alm. Thurston Hole, M.D.              DATE OF BIRTH:  [birth year]   DATE OF PROCEDURE:  11/29/2003  DATE OF DISCHARGE:                                 OPERATIVE REPORT   PREOPERATIVE DIAGNOSIS:  Right hip degenerative joint disease.   POSTOPERATIVE DIAGNOSIS:  Right hip degenerative joint disease.   PROCEDURE:  Right total hip replacement using [employer] total hip system  with acetabulum 52 mm PSL Press-Fit acetabulum with two locking screws and  10 degree polyethylene liner. Femoral component #6 Eon cemented femoral  component with plus 0 x 32 mm femoral head with 12 mm centralizer and #3  cement plug.   SURGEON:  Elana Alm. Thurston Hole, M.D.   ASSISTANT:  Julien Girt, P.A.   ANESTHESIA:  General.   OPERATIVE TIME:  1 hour and 40 minutes.   ESTIMATED BLOOD LOSS:  400 mL   DESCRIPTION OF PROCEDURE:  Ms. Sanko was brought to the operating room on  November 29, 2003, placed on the operating room in supine position. After an  adequate level of general anesthesia was obtained, her right hip was  examined under anesthesia. She had flexion to 90, extension to 0, internal  and external rotation of 30 degrees with approximately 1 inch of shortening  in the right leg compared to the left. She had a Foley catheter placed under  sterile conditions and received Ancef 1 g IV preoperatively for prophylaxis.  Initially then she was turned in the right lateral up decubitus position and  secured on the bed with a Mark frame. Her right hip and leg was prepped  using sterile duraprep and drape using sterile technique. Initially through  a 20 cm posterolateral greater trochanteric incision, initial exposure was  made. The underlying subcutaneous tissues were incised in line  with the skin  incision. The iliotibial band and gluteus maximus fascia was incised  longitudinally revealing the underlying sciatic nerve which was carefully  protected. The short external rotators of the hip and hip capsule were  released off the femoral neck insertion and tagged and the hip was then  posteriorly dislocated. The femoral head was found to have grade 3 and 4 DJD  and chondromalacia throughout. A femoral neck cut was then made 1 1/2 to 2  cm above the lesser trochanter under the appropriate amount of anteversion  and abduction and inclination. After this was done, the acetabulum was  exposed. Grade 3 and 4 chondromalacia and DJD noted as well. Degenerative  acetabular labrum was removed from around the edges. Carefully placed  retractors were placed and then sequentially the acetabular reaming was  carried out to a 52 mm size and the appropriate  amount of anteversion and  abduction and inclination. After this was done, the 52 mm trial was placed  and found to be an excellent fit. This was then removed and the actual 52 mm  Trident Press-Fit cup was hammered into position and the appropriate amount  of anteversion and abduction and inclination. This was an excellent Community education officer.  It was further secured in place with two separate locking screws, one  20 mm and one 16 mm, one in the 12 o'clock and one in the 11 o'clock  position. After this was done, the 10 degree polyethylene liner was then  hammered into position with the lip in the posterolateral position. After  this was done, the proximal femur was exposed. Sequential reaming was  carried out with an axial reamer up to a #6 cement 6 size and then broaches  up to a #6 size and with a #6 broach in place, a plus 0 x 32 mm trial  femoral head and neck was placed, hip reduced, taken through a range of  motion, found to be stable up to 80 degrees of internal rotation in both  neutral and 30 degrees of adduction and also stable  in abduction and  external rotation and leg lengths were within 1/2 an inch of each other with  significant correction from her previous shortening. The femoral trial was  then dislocated and removed. The femoral canal was measured for a cement  plug and a #3 was found to be the appropriate size and this was then placed.  The femoral canal was then jet lavage irrigated with 3 liters of saline  solution and then filled with bone cement and then the actual #6 Eon stem  was placed down and hammered into position with an excellent fit with excess  cement being removed from around the edges. After the cement hardened, the  plus 0 x 32 mm femoral head was hammered onto the femoral neck with an  excellent Morse tapered fit. The hip was then reduced, taken through a full  range of motion, found to be stable and leg lengths equalized. At this  point, it was felt that all the components were of excellent size, fit and  stability. The wound was further irrigated with antibiotic solution and then  the short external rotators of the hip and hip capsule were reattached to  their femoral neck through two drill holes in the greater trochanter. The  iliotibial band and gluteus maximus fascia was closed with #1 Ethibond  suture. The subcutaneous tissue was closed with 0 and 2-0 Vicryl, skin  closed with skin staples. Sterile dressings were applied and a hip abduction  pillow applied. The patient was then turned supine, checked for leg lengths  which were equal, rotation was equal. Neurovascular status was intact. The  patient was then awakened, extubated and taken to the recovery room in  stable condition. Needle and sponge count was correct x2 at the end of the  case.                                               Robert A. Thurston Hole, M.D.    RAW/MEDQ  D:  11/29/2003  T:  11/29/2003  Job:  [id number]

## 2011-05-18 NOTE — Consult Note (Signed)
NAMEPRIYAH, SCHMUCK                              ACCOUNT NO.:  [id number]   MEDICAL RECORD NO.:  [id number]                   PATIENT TYPE:  INP   LOCATION:  5733                                 FACILITY:  [facility]   PHYSICIAN:  Althea Grimmer. Luther Parody, M.D.            DATE OF BIRTH:  12/26/1929   DATE OF CONSULTATION:  12/31/2003  DATE OF DISCHARGE:                                   CONSULTATION   REFERRING PHYSICIAN:  Elliot Cousin, M.D.   HISTORY OF PRESENT ILLNESS:  Ms. Akkerman is a 77 year old female whom I am  asked to see for acute pancreatitis.  She presented to the emergency room  complaining of relatively severe band-like epigastric pain radiating into  her back.  At the initiation of pain, she may have had some left shoulder  discomfort as well.  She said that she had a similar, but brief episode  approximately two weeks ago.  In the emergency room, she was discovered to  have pancreatitis with an amylase of 241 and a lipase of 3669.  On CT scan,  the pancreatitis appears quite severe, but without necrosis.  There are  gallstones seen.  Her common bile duct is 14 mm.  She denies any prior  knowledge of gallstones, however, she has been told in the past that she had  hemochromatosis and has undergone phlebotomies.  In addition, she had cancer  of the colon with a sigmoid colectomy in 1996.  Followup colonoscopies in  2001 and again in August of this year were negative for polyps.  The patient  reports that with the onset of her abdominal pain yesterday she had nausea  and vomiting.  She did not vomit any blood.  She has been moving her bowels  regularly.  She occasionally sees blood on the toilet tissue.  Rectal exam  did reveal guaiac-positive stool on admission, as well as hemorrhoids.  She  has not vomited in the last three hours and says that her abdominal pain is  significantly improved at present.   PAST MEDICAL HISTORY:  Pertinent for a total hip replacement in November of  this year.  She was on Coumadin briefly, but this has been discontinued  yesterday.  Other medical problems include a history of a benign breast lump  removal, hyperlipidemia, and constipation.   OUTPATIENT MEDICATIONS:  Senokot, Lipitor, glucosamine, a combination of  triamterene/hydrochlorothiazide, and Ultram.   ALLERGIES:  None reported.   FAMILY HISTORY:  Noncontributory.   SOCIAL HISTORY:  Married.  Retired, but working in her [employer].  A nonuser of alcohol and tobacco.   REVIEW OF SYSTEMS:  GENERAL:  No weight loss or night sweats.  ENDOCRINE:  No history of diabetes or thyroid problems.  SKIN:  No rash or pruritus.  EYES:  No icterus or change in vision.  EARS, NOSE, AND THROAT:  No aphthous  ulcers or chronic sore throat.  RESPIRATORY:  No shortness of breath or  wheezing.  Mildly productive cough at present.  GASTROINTESTINAL:  As above.  GENITOURINARY:  No dysuria or hematuria.  The remainder of the review of  systems is negative.   PHYSICAL EXAMINATION:  GENERAL APPEARANCE:  She is alert and in no acute  distress.  VITAL SIGNS:  Afebrile and normotensive.  SKIN:  Normal.  HEENT:  Eyes anicteric.  Oropharynx unremarkable.  NECK:  Supple without thyromegaly.  There is no cervical or inguinal  adenopathy.  CHEST:  Sounds clear.  HEART:  Regular rate and rhythm.  ABDOMEN:  Soft.  There are hypoactive bowel sounds and generalized mild  upper abdominal tenderness to deep palpation.  RECTAL:  Exam is not repeated.  EXTREMITIES:  1+ edema.   LABORATORY TESTS:  Hemoglobin 10.9, white blood cell count 7.6, platelet  count 209.  Prothrombin time 15.4 with an INR of 1.3.  Liver function tests  are entirely normal.  Amylase 241, lipase 3669.  Albumin 3.2.  Potassium 3.3  (she is receiving potassium in her IV).   IMPRESSION:  Relatively severe gallstone pancreatitis.  In light of her  improvement in pain and normal LFTs, I suspected that she has passed the   gallstone.  However, I just received the ultrasound report that indicates  there is a probable stone in the distal common bile duct.  This sonogram  will be reviewed and I will discuss the timing of ERCP if needed with Dr.  Abbey Chatters who is on call for surgery.  I do believe the patient will  require supportive care and a cholecystectomy on this admission.                                               Althea Grimmer. Luther Parody, M.D.    PJS/MEDQ  D:  12/31/2003  T:  12/31/2003  Job:  [id number]   cc:   Adolph Pollack, M.D.  [address], [address]  [place]  [place] [address]  Fax: [phone]   Sheppard Plumber. Earlene Plater, M.D.  [address] [place]  [place] [address]  Fax: [phone]   Llana Aliment. Malon Kindle., M.D.  [address]  [place]  [place] [address]  Fax: [phone]

## 2011-05-18 NOTE — Op Note (Signed)
[name], [name]                              ACCOUNT NO.:  [id number]   MEDICAL RECORD NO.:  [id number]                   PATIENT TYPE:  INP   LOCATION:  5730                                 FACILITY:  [facility]   PHYSICIAN:  Timothy E. Earlene Plater, M.D.              DATE OF BIRTH:  1929/03/19   DATE OF PROCEDURE:  01/12/2004  DATE OF DISCHARGE:                                 OPERATIVE REPORT   PREOPERATIVE DIAGNOSIS:  Cholecystolithiasis, common duct stones, and  biliary pancreatitis.   POSTOPERATIVE DIAGNOSIS:  Chronic cholecystolithiasis, common duct stones,  incarcerated umbilical hernia, mass of dome of right liver.   PROCEDURE:  Repair of umbilical hernia and laparoscopic cholecystectomy and  operative cholangiogram.   SURGEON:  Timothy E. Earlene Plater, M.D.   ASSISTANT:  Anselm Pancoast. Zachery Dakins, M.D.   ANESTHESIA:  CRNA supervised M.D.   INDICATIONS FOR PROCEDURE:  Please see the remainder of the chart.  The  patient has been here now 12 days with biliary pancreatitis.  She has  subsided satisfactorily and is now ready to proceed with surgery.  This has  been carefully discussed.  She was seen, identified, and permit signed.   DESCRIPTION OF PROCEDURE:  The patient was taken to the operating room,  placed supine, and general endotracheal anesthesia administered.  There were  no complications.  A long midline incision was used which rounded the  umbilicus on the right.  I chose a spot to the left of the umbilicus in the  inferior position and after locally anesthetizing the skin, a horizontal  incision was made.  An incarcerated umbilical hernia was immediately come  upon, however, the fascial edges were palpable.  The fascial edges were  dissected out.  The underlying adherent bowel and omentum were dissected  back from the edges and with gentle blunt dissection, I was able to burrow a  channel to the right upper quadrant.  I then placed a stitch of #1 Vicryl on  the fascial edges,  placed the Hasson catheter through the sutures, and up  into the right upper quadrant.  The abdomen insufflated nicely.  Camera was  introduced.  The gallbladder appeared chronically inflamed, but not acutely.  There was a sizable mass in the dome of the liver that has been called by CT  scan a cyst.  It was sizable, estimated 4 to 6 cm.  It had a fairly  worrisome gross look about it and pictures were made and included in the  chart.  A second 10 mm trocar placed in the midepigastrium.  Two 5 mm  trocars in the right upper quadrant.  The gallbladder was grasped and placed  on tension.  Adhesions in the lower part of the gallbladder were gently  taken down.  The cystic duct was identified.  It was dissected out  completely.  An anterior artery was dissected out, triply clipped, and  divided.  A complete window was made posterior to the cystic duct.  A clip  was placed on the cystic duct near its junction with the gallbladder.  An  incision made in the cystic duct.  The bowel was not under pressure and it  was golden yellow.  A catheter was placed percutaneously and introduced into  the cystic duct stump and using real-time fluoroscopy a cholangiogram was  carried out.  Pictures of which are enclosed.  It showed a dilated biliary  system with obvious stones in the distal common duct.  There was a small  amount of dye that did go into the duodenum.   The clip and the catheter removed.  The cystic duct stump triply clipped and  completely divided.  Further dissection in removing the gallbladder from the  gallbladder bed.  Several small vessels were encountered and these were  clipped.  The gallbladder was removed without complications or incident.  The gallbladder bed was dry and it was copiously irrigated.  The gallbladder  was placed in an EndoCatch bag and then brought to the infraumbilical  incision where it was removed without difficulty.  The edges of the  umbilical hernia were now  palpable and easily opposed and closed with #1  Vicryl suture.  Further inspection of the area revealed no evidence of  complications.  Irrigation was CO2.  Instruments and trocars were removed  under direct vision and then each skin incision was checked and closed with  Monocryl.  All needle, sponge, and instrument counts correct.  Dry sterile  dressing applied after Steri-Strips.  She tolerated the procedure well.  Final needle, sponge, and instrument counts correct.  She was removed to the  recovery room in good condition.                                               Timothy E. Earlene Plater, M.D.    TED/MEDQ  D:  01/12/2004  T:  01/12/2004  Job:  [id number]   cc:   Elliot Cousin, M.D.   James L. Malon Kindle., M.D.  [address]  [place]  [place] [address]  Fax: [phone]   Antony Madura, M.D.  [address]  [place]  [place] [address]  Fax: [phone]

## 2011-05-21 ENCOUNTER — Other Ambulatory Visit: Payer: Self-pay | Admitting: Internal Medicine

## 2011-05-21 DIAGNOSIS — Z1231 Encounter for screening mammogram for malignant neoplasm of breast: Secondary | ICD-10-CM

## 2011-06-19 ENCOUNTER — Ambulatory Visit
Admission: RE | Admit: 2011-06-19 | Discharge: 2011-06-19 | Disposition: A | Discharge status: Home / Self Care | Payer: Medicare Other | Source: Ambulatory Visit | Attending: Internal Medicine | Admitting: Internal Medicine

## 2011-06-19 DIAGNOSIS — Z1231 Encounter for screening mammogram for malignant neoplasm of breast: Secondary | ICD-10-CM

## 2011-08-28 ENCOUNTER — Emergency Department (HOSPITAL_COMMUNITY): Payer: Medicare Other

## 2011-08-28 ENCOUNTER — Inpatient Hospital Stay (HOSPITAL_COMMUNITY)
Admission: EM | Admit: 2011-08-28 | Discharge: 2011-08-30 | DRG: 310 | Disposition: A | Discharge status: Home Health | Payer: Medicare Other | Attending: Internal Medicine | Admitting: Internal Medicine

## 2011-08-28 DIAGNOSIS — E785 Hyperlipidemia, unspecified: Secondary | ICD-10-CM | POA: Diagnosis present

## 2011-08-28 DIAGNOSIS — Z79899 Other long term (current) drug therapy: Secondary | ICD-10-CM

## 2011-08-28 DIAGNOSIS — I4891 Unspecified atrial fibrillation: Principal | ICD-10-CM | POA: Diagnosis present

## 2011-08-28 DIAGNOSIS — D696 Thrombocytopenia, unspecified: Secondary | ICD-10-CM | POA: Diagnosis present

## 2011-08-28 DIAGNOSIS — Z85038 Personal history of other malignant neoplasm of large intestine: Secondary | ICD-10-CM

## 2011-08-28 DIAGNOSIS — E039 Hypothyroidism, unspecified: Secondary | ICD-10-CM | POA: Diagnosis present

## 2011-08-28 LAB — DIFFERENTIAL
Basophils Absolute: 0 10*3/uL (ref 0.0–0.1)
Basophils Relative: 0 % (ref 0–1)
Eosinophils Absolute: 0.1 10*3/uL (ref 0.0–0.7)
Eosinophils Relative: 1 % (ref 0–5)
Lymphocytes Relative: 50 % — ABNORMAL HIGH (ref 12–46)
Lymphs Abs: 3 10*3/uL (ref 0.7–4.0)
Monocytes Absolute: 0.4 10*3/uL (ref 0.1–1.0)
Monocytes Relative: 7 % (ref 3–12)
Neutro Abs: 2.5 10*3/uL (ref 1.7–7.7)
Neutrophils Relative %: 42 % — ABNORMAL LOW (ref 43–77)

## 2011-08-28 LAB — POCT I-STAT, CHEM 8
BUN: 21 mg/dL (ref 6–23)
Calcium, Ion: 1.12 mmol/L (ref 1.12–1.32)
Chloride: 106 mEq/L (ref 96–112)
Creatinine, Ser: 0.8 mg/dL (ref 0.50–1.10)
Glucose, Bld: 92 mg/dL (ref 70–99)
HCT: 46 % (ref 36.0–46.0)
Hemoglobin: 15.6 g/dL — ABNORMAL HIGH (ref 12.0–15.0)
Potassium: 4.4 mEq/L (ref 3.5–5.1)
Sodium: 141 mEq/L (ref 135–145)
TCO2: 28 mmol/L (ref 0–100)

## 2011-08-28 LAB — CBC
HCT: 43.2 % (ref 36.0–46.0)
Hemoglobin: 14.9 g/dL (ref 12.0–15.0)
MCH: 32.5 pg (ref 26.0–34.0)
MCHC: 34.5 g/dL (ref 30.0–36.0)
MCV: 94.1 fL (ref 78.0–100.0)
Platelets: 140 10*3/uL — ABNORMAL LOW (ref 150–400)
RBC: 4.59 MIL/uL (ref 3.87–5.11)
RDW: 12.1 % (ref 11.5–15.5)
WBC: 6 10*3/uL (ref 4.0–10.5)

## 2011-08-28 LAB — POCT I-STAT TROPONIN I: Troponin i, poc: 0.03 ng/mL (ref 0.00–0.08)

## 2011-08-28 LAB — PROTIME-INR
INR: 0.92 (ref 0.00–1.49)
Prothrombin Time: 12.6 seconds (ref 11.6–15.2)

## 2011-08-29 ENCOUNTER — Inpatient Hospital Stay (HOSPITAL_COMMUNITY): Payer: Medicare Other

## 2011-08-29 LAB — COMPREHENSIVE METABOLIC PANEL
BUN: 14 mg/dL (ref 6–23)
CO2: 26 mEq/L (ref 19–32)
Calcium: 9.4 mg/dL (ref 8.4–10.5)
Chloride: 107 mEq/L (ref 96–112)
Creatinine, Ser: <0.47 mg/dL — ABNORMAL LOW (ref 0.50–1.10)
Glucose, Bld: 96 mg/dL (ref 70–99)
Total Bilirubin: 1.1 mg/dL (ref 0.3–1.2)

## 2011-08-29 LAB — LIPID PANEL
Cholesterol: 153 mg/dL (ref 0–200)
LDL Cholesterol: 73 mg/dL (ref 0–99)
Triglycerides: 80 mg/dL (ref <150)

## 2011-08-29 LAB — HEPARIN LEVEL (UNFRACTIONATED): Heparin Unfractionated: 0.71 IU/mL — ABNORMAL HIGH (ref 0.30–0.70)

## 2011-08-29 LAB — CBC
HCT: 38.9 % (ref 36.0–46.0)
MCH: 31.6 pg (ref 26.0–34.0)
MCV: 94.4 fL (ref 78.0–100.0)
RBC: 4.12 MIL/uL (ref 3.87–5.11)
WBC: 4.9 10*3/uL (ref 4.0–10.5)

## 2011-08-29 LAB — CARDIAC PANEL(CRET KIN+CKTOT+MB+TROPI)
CK, MB: 3.7 ng/mL (ref 0.3–4.0)
Relative Index: INVALID (ref 0.0–2.5)
Total CK: 75 U/L (ref 7–177)

## 2011-08-29 MED ORDER — IOHEXOL 300 MG/ML  SOLN
100.0000 mL | Freq: Once | INTRAMUSCULAR | Status: AC | PRN
  Administered 2011-08-29: 100 mL via INTRAVENOUS

## 2011-08-30 LAB — CBC
MCH: 31.8 pg (ref 26.0–34.0)
MCHC: 33.3 g/dL (ref 30.0–36.0)
MCV: 95.5 fL (ref 78.0–100.0)
Platelets: 158 10*3/uL (ref 150–400)
RBC: 4.24 MIL/uL (ref 3.87–5.11)

## 2011-09-04 NOTE — H&P (Signed)
[name], [name] NO.:  [id number]  MEDICAL RECORD NO.:  [id number]  LOCATION:  [facility]                         FACILITY:  [facility]  PHYSICIAN:  Eduard Clos, MDDATE OF BIRTH:  1929/01/15  DATE OF ADMISSION:  08/28/2011 DATE OF DISCHARGE:                             HISTORY & PHYSICAL   PRIMARY CARE PHYSICIAN:  Antony Madura, MD  CHIEF COMPLAINT:  Palpitation.  HISTORY OF PRESENTING ILLNESS:  A 77 year old female with known history of hypothyroidism, hyperlipidemia, hemochromatosis, history of CA of colon status post rectosigmoid resection with history of hemochromatosis status post multiple phlebotomies in 1999 experienced some jaw pain around 4:00 p.m. and she also had some sore throat like pain in the morning.  The jaw pain resolved spontaneously but subsequent to that, the patient developed some palpitations which persisted and she decided to go to the Urgent Care and was referred to the ER, the patient was found to be in AFib with RVR.  The patient's heart rate was found to be around 160s.  EKG confirmed AFib with RVR.  Cardizem IV bolus was given after which her heart rate is controlled but the patient still in AFib. The patient does not have any previous history of atrial fibrillation. Presently, the patient is admitted for further observation.  The patient denies any chest pain or shortness of breath.  Denies any nausea, vomiting, abdominal pain, dysuria, discharge, or diarrhea. Denies any dizziness or loss of consciousness.  Denies any fever, chills, cough, or phlegm.  PAST MEDICAL HISTORY: 1. History of hemochromatosis which required multiple phlebotomies in     1999. 2. History of colon cancer status post rectosigmoid resection. 3. History of hypothyroidism. 4. History of hyperlipidemia. 5. History of gallstone pancreatitis status post cholecystectomy. 6. History of right breast lumpectomy which was benign.  MEDICATIONS PRIOR TO  ADMISSION:  The patient is on: 1. Milk of magnesia. 2. Fish oil. 3. Levothyroxine 50 mcg p.o. daily. 4. Furosemide 20 mg p.o. daily. 5. Vitamin E with B12. 6. Senna. 7. MiraLax. 8. Lipitor 10 mg p.o. daily.  ALLERGIES:  FERROUS SULFATE.  SOCIAL HISTORY:  The patient denies smoking cigarettes, drinking alcohol, or abusing any drugs.  FAMILY HISTORY:  Negative for any hemochromatosis.  Positive for stroke and heart attack in her mother.  Father died at age 20 secondary to old age.  He did have history of CAD status post CABG.  REVIEW OF SYSTEMS:  As per history of present illness, nothing else significant.  PHYSICAL EXAMINATION:  GENERAL:  The patient is examined at bedside, not in acute distress. VITAL SIGNS:  Blood pressure is 120/70, pulse 89 per minute, temperature 97.6, respirations 18 per minute, and O2 saturation 99%. HEENT:  Anicteric.  No pallor.  I do not see any localized tenderness or swelling at this time.  There is no tooth caries.  PERRLA positive.  No discharge from ears, eyes, nose, or mouth. CHEST:  Bilateral air entry present.  No rhonchi.  No crepitation. HEART:  S1 and S2 heart.  I could hear a small diastolic murmur. ABDOMEN:  Soft and nontender.  Bowel sounds heard. CNS:  The patient is alert,  awake, and oriented to time, place, and person.  Moves upper and lower extremities 5/5. EXTREMITIES:  Peripheral pulses felt.  No edema.  LABORATORY DATA:  EKG shows atrial fibrillation with heart rate around 122 beats per minute with nonspecific ST-T changes.  Chest x-ray shows mild medial right lung base opacity and nonspecific __________ represent atelectasis or scarring.  Arthritis for normal limits to mildly enlarged.  CBC; WBC is 6, hemoglobin is 15.6, hematocrit is 46, and platelets 140.  PT/INR is 12.6 and 0.92.  Basic metabolic panel; sodium 141, potassium 4.4, chloride 106, glucose 92, BUN 21, creatinine 0.8, carbon dioxide is 28.  Troponin  0.03.  ASSESSMENT: 1. Atrial fibrillation with rapid ventricular response, presently rate     controlled after 1 bolus of Cardizem. 2. History of hypothyroidism. 3. History of hyperlipidemia. 4. History of hemochromatosis. 5. History of colon cancer status post rectosigmoid resection. 6. History of gallstone pancreatitis status post cholecystectomy.  PLAN: 1. At this time, we will admit the patient to telemetry. 2. For her AFib, at this time, her rate is controlled.  At this time,     her CHADS-2 score is 1 and only risk factor is age being more than     64.  The patient states she is not hypertensive.  Her x-ray does     show cardiomegaly though the patient is not in any overt CHF.  At     this time, I am going to place the patient on IV heparin until we     get 2-D echo and make sure there are no valvular problems.  I am     also going to get D-dimer and TSH levels to make sure there are no     precipitating factors for her AFib.  We will be cycling cardiac     markers.  Based on these     factors, we will see if the patient needs to be on long-term     Coumadin or not. 3. Her CBC does show thrombocytopenia.  We are going to closely follow     her platelet counts.  Further recommendation based on test order     and clinical course.     Eduard Clos, MD     ANK/MEDQ  D:  08/29/2011  T:  08/29/2011  Job:  [id number]  Electronically Signed by Midge Minium MD on 09/04/2011 09:35:36 AM

## 2011-09-13 ENCOUNTER — Encounter: Payer: Self-pay | Admitting: Cardiovascular Disease

## 2011-09-13 ENCOUNTER — Ambulatory Visit (INDEPENDENT_AMBULATORY_CARE_PROVIDER_SITE_OTHER): Payer: Medicare Other | Admitting: Cardiovascular Disease

## 2011-09-13 VITALS — BP 152/90 | Ht 63.0 in | Wt 177.0 lb

## 2011-09-13 DIAGNOSIS — I4891 Unspecified atrial fibrillation: Secondary | ICD-10-CM

## 2011-09-13 DIAGNOSIS — I482 Chronic atrial fibrillation, unspecified: Secondary | ICD-10-CM | POA: Insufficient documentation

## 2011-09-13 MED ORDER — DILTIAZEM HCL ER COATED BEADS 120 MG PO CP24: 120.0000 mg | ORAL_CAPSULE | Freq: Every day | ORAL | Status: DC

## 2011-09-13 NOTE — Patient Instructions (Signed)
Your physician has recommended you make the following change in your medication: 1) Start Cardizem CD 120mg  once daily.  We will have you establish coumadin followup with our coumadin clinic: you will need to be seen next week.  Your physician recommends that you schedule a follow-up appointment in: 6 weeks

## 2011-09-13 NOTE — Progress Notes (Signed)
History of Present Illness:77 yo WF with history of colon cancer, hypothyroidism, HLD, HTN, hemochromatosis, recently admitted to [facility]  With atrial fibrillation with RVR. She was cared for by the Hospitalist team. Cardiology was not consulted. She was started on a cardizem drip and converted to NSR. She is here today for follow up. She is feeling well. No chest pain or SOB. EKG is NSR today. No dizziness, near syncope or syncope.   Primary care is Dr. Burton Apley.   Past Medical History  Diagnosis Date  . HTN (hypertension)   . Environmental allergies   . Arthritis   . Thyroid disease   . History of colon cancer   . Hemochromatosis 1998  . Atrial fibrillation     Past Surgical History  Procedure Date  . Colon surgery 1996    CANCER REMOVAL  . Total hip arthroplasty 2004    RIGHT  . Gallbladder surgery 2005    Current Outpatient Prescriptions  Medication Sig Dispense Refill  . Acetaminophen (ACETAMIN PO) Take by mouth as needed.        Marland Kitchen atorvastatin (LIPITOR) 10 MG tablet Take 1 tablet by mouth Daily.      . furosemide (LASIX) 20 MG tablet Take 1 tablet by mouth Daily.      Marland Kitchen levothyroxine (SYNTHROID, LEVOTHROID) 50 MCG tablet Take 1 tablet by mouth Daily.      . Misc Natural Products (OSTEO BI-FLEX JOINT SHIELD PO) Take by mouth 2 (two) times daily.        . Omega-3 Fatty Acids (FISH OIL) 1200 MG CAPS Take by mouth daily.        . polyethylene glycol (MIRALAX / GLYCOLAX) packet Take 17 g by mouth daily as needed.        . senna (SENOKOT) 8.6 MG TABS Take 1 tablet by mouth daily as needed.        . vitamin B-12 (CYANOCOBALAMIN) 100 MCG tablet Take 50 mcg by mouth daily.        . vitamin E 400 UNIT capsule Take 400 Units by mouth daily.        Marland Kitchen warfarin (COUMADIN) 1 MG tablet AS DIRECTED        No Known Allergies  History   Social History  . Marital Status: Married    Spouse Name: N/A    Number of Children: N/A  . Years of Education: N/A    Occupational History  . Not on file.   Social History Main Topics  . Smoking status: Not on file  . Smokeless tobacco: Not on file  . Alcohol Use: Not on file  . Drug Use: Not on file  . Sexually Active: Not on file   Other Topics Concern  . Not on file   Social History Narrative  . No narrative on file    Family History  Problem Relation Age of Onset  . Heart disease Mother   . Heart disease Father   . Pneumonia Brother     Review of Systems:  As stated in the HPI and otherwise negative.   BP 152/90  Ht 5\' 3"  (1.6 m)  Wt 177 lb (80.287 kg)  BMI 31.35 kg/m2  Physical Examination: General: Well developed, well nourished, NAD HEENT: OP clear, mucus membranes moist SKIN: warm, dry. No rashes. Neuro: No focal deficits Musculoskeletal: Muscle strength 5/5 all ext Psychiatric: Mood and affect normal Neck: No JVD, no carotid bruits, no thyromegaly, no lymphadenopathy. Lungs:Clear bilaterally, no wheezes, rhonci, crackles  Cardiovascular: Regular rate and rhythm. No murmurs, gallops or rubs. Abdomen:Soft. Bowel sounds present. Non-tender.  Extremities: No lower extremity edema. Pulses are 2 + in the bilateral DP/PT.  Echo: 08/30/11:  Left ventricle: The cavity size was normal. Wall thickness was     increased in a pattern of mild LVH. Systolic function was normal.     The estimated ejection fraction was in the range of 50% to 55%.     Wall motion was normal; there were no regional wall motion     abnormalities.   - Aortic valve: Mild regurgitation.   - Mitral valve: Mild to moderate regurgitation.   - Left atrium: The atrium was mildly to moderately dilated.   - Pulmonary arteries: Systolic pressure was mildly increased.

## 2011-09-13 NOTE — Assessment & Plan Note (Addendum)
Back in sinus today. Will continue coumadin. She is asking to follow in our coumadin clinic. Will start Cardizem CD 120 mg po Qdaily. Will consider Pradaxa at next f/u. Spent 30 minutes discussing nature of disease and treatment.

## 2011-09-20 ENCOUNTER — Ambulatory Visit (INDEPENDENT_AMBULATORY_CARE_PROVIDER_SITE_OTHER): Payer: Medicare Other | Admitting: *Deleted

## 2011-09-20 DIAGNOSIS — Z7901 Long term (current) use of anticoagulants: Secondary | ICD-10-CM

## 2011-09-20 DIAGNOSIS — I4891 Unspecified atrial fibrillation: Secondary | ICD-10-CM

## 2011-09-24 NOTE — Discharge Summary (Signed)
NAMECORALYN, [name]                  ACCOUNT NO.:  [id number]  MEDICAL RECORD NO.:  [id number]  LOCATION:  2013                         FACILITY:  [facility]  PHYSICIAN:  Jonny Ruiz, MD    DATE OF BIRTH:  [birth year]  DATE OF ADMISSION:  08/28/2011 DATE OF DISCHARGE:  08/30/2011                              DISCHARGE SUMMARY   ADMITTING DIAGNOSIS:  New onset atrial fibrillation with rapid ventricular response.  DISCHARGE DIAGNOSIS:  Atrial fibrillation with rapid ventricular response, now converted to normal sinus rhythm.  SECONDARY DIAGNOSES:  Hypothyroidism, hyperlipidemia, hemochromatosis, history of colon cancer, and history of gallstone pancreatitis.  PROCEDURES:  PICC line and CT angiogram of the chest with contrast.  IMPRESSION: 1. No evidence of acute pulmonary embolus. 2. Mild pulmonary atelectasis. 3. Stable and benign occasional 2-mm pulmonary nodules of the lung     base postinflammatory.  Stable and benign partially visible low     density liver lesions.  CONSULTANTS:  None.  HISTORY OF PRESENT ILLNESS:  [name] is a 77 year old female who came to the emergency department for jaw pain and throat pain associated with palpitations.  Initially, she went to an Urgent Care Department and was referred to our emergency department where she was found in rapid AFib with a rate of 160s.  She had received a bolus of Cardizem IV and her atrial fibrillation became under heart rate control.  Subsequently, she was admitted to the hospital for further management.  HOSPITAL COURSE:  The patient was admitted to telemetry.  Her blood pressure and heart rate remained under control and stable.  Her pulse ox was 99%.  She ruled out for a myocardial infarction by serial cardiac enzymes and EKG.  The following day, she desaturated and subsequently she underwent a CT angiogram of the chest which showed no evidence of acute pulmonary embolism.  Her oxygen saturation normalized  with supplemental oxygen.  She remained on IV heparin during her hospital stay and on the day of her discharge, she was switched to Lovenox and Coumadin.  She received education via a Coumadin video and a booklet that was provided.  Arrangements were made via home health for her to receive home Lovenox along with Coumadin until her PT/INR reaches therapeutic levels between 2 and 3.  The patient will be tested for her PT/INR tomorrow, August 30, 2011, as well as next Tuesday, September 04, 2011.  Results will be sent to her primary care physician.  On second hospital day, the patient converted to normal sinus rhythm and remained in sinus rhythm until she was discharged.  She feels well.  She has no symptoms whatsoever and her cardiopulmonary exam is normal.  The rest of her lab work including CBC, CMP, and TSH were normal.  She had no complication during her hospital state and then she is deemed to be improved and stable to go home and will follow up with her primary care physician in 1 week.  CONDITION ON DISCHARGE:  Stable.          ______________________________ Jonny Ruiz, MD     GL/MEDQ  D:  08/30/2011  T:  08/30/2011  Job:  [id number]  cc:   Antony Madura, M.D.  Electronically Signed by Jonny Ruiz MD on 09/24/2011 04:05:35 PM

## 2011-09-25 ENCOUNTER — Ambulatory Visit (INDEPENDENT_AMBULATORY_CARE_PROVIDER_SITE_OTHER): Payer: Medicare Other | Admitting: *Deleted

## 2011-09-25 DIAGNOSIS — I4891 Unspecified atrial fibrillation: Secondary | ICD-10-CM

## 2011-09-25 DIAGNOSIS — Z7901 Long term (current) use of anticoagulants: Secondary | ICD-10-CM

## 2011-10-16 ENCOUNTER — Ambulatory Visit (INDEPENDENT_AMBULATORY_CARE_PROVIDER_SITE_OTHER): Payer: Medicare Other | Admitting: *Deleted

## 2011-10-16 DIAGNOSIS — I4891 Unspecified atrial fibrillation: Secondary | ICD-10-CM

## 2011-10-16 DIAGNOSIS — Z7901 Long term (current) use of anticoagulants: Secondary | ICD-10-CM

## 2011-10-16 MED ORDER — WARFARIN SODIUM 3 MG PO TABS: ORAL_TABLET | ORAL | Status: DC

## 2011-10-19 ENCOUNTER — Encounter: Payer: Self-pay | Admitting: Cardiovascular Disease

## 2011-10-22 ENCOUNTER — Ambulatory Visit (INDEPENDENT_AMBULATORY_CARE_PROVIDER_SITE_OTHER): Payer: Medicare Other | Admitting: *Deleted

## 2011-10-22 ENCOUNTER — Ambulatory Visit (INDEPENDENT_AMBULATORY_CARE_PROVIDER_SITE_OTHER): Payer: Medicare Other | Admitting: Cardiovascular Disease

## 2011-10-22 ENCOUNTER — Encounter: Payer: Self-pay | Admitting: Cardiovascular Disease

## 2011-10-22 VITALS — BP 152/76 | HR 83 | Resp 18 | Ht 63.0 in | Wt 180.8 lb

## 2011-10-22 DIAGNOSIS — Z7901 Long term (current) use of anticoagulants: Secondary | ICD-10-CM

## 2011-10-22 DIAGNOSIS — I4891 Unspecified atrial fibrillation: Secondary | ICD-10-CM

## 2011-10-22 LAB — POCT INR: INR: 2.1

## 2011-10-22 NOTE — Assessment & Plan Note (Signed)
She has maintained sinus rhythm. She is on coumadin and Cardizem. Will be ok to hold coumadin prior to her colonoscopy in November. Would resume coumadin one week after procedure.

## 2011-10-22 NOTE — Progress Notes (Signed)
History of Present Illness:77 yo WF with history of colon cancer, hypothyroidism, HLD, HTN, hemochromatosis, recently admitted to [facility] With atrial fibrillation with RVR. She was cared for by the Hospitalist team. Cardiology was not consulted. She was started on a cardizem drip and converted to NSR. I saw her on 09/26/11 for hospital follow-up and she was in sinus rhythm. We continued coumadin but briefly discussed the potential use of Pradaxa in the future. Her echo showed mild aortic valve insufficiency and mild to moderate mitral regurgitation in August 2012.   She is here today for follow up. She is feeling well. No chest pain or SOB. EKG is NSR today. No dizziness, near syncope or syncope. She has plans for a colonoscopy on 11/12/11.   Primary care is with Dr. Burton Apley.   Past Medical History  Diagnosis Date  . HTN (hypertension)   . Environmental allergies   . Arthritis   . Thyroid disease   . History of colon cancer   . Hemochromatosis 1998  . Atrial fibrillation     Past Surgical History  Procedure Date  . Colon surgery 1996    CANCER REMOVAL  . Total hip arthroplasty 2004    RIGHT  . Gallbladder surgery 2005    Current Outpatient Prescriptions  Medication Sig Dispense Refill  . Acetaminophen (ACETAMIN PO) Take by mouth as needed.        Marland Kitchen atorvastatin (LIPITOR) 10 MG tablet Take 1 tablet by mouth Daily.      Marland Kitchen diltiazem (CARDIZEM CD) 120 MG 24 hr capsule Take 1 capsule (120 mg total) by mouth daily.  30 capsule  6  . furosemide (LASIX) 20 MG tablet Take 1 tablet by mouth Daily.      Marland Kitchen levothyroxine (SYNTHROID, LEVOTHROID) 50 MCG tablet Take 1 tablet by mouth Daily.      . Misc Natural Products (OSTEO BI-FLEX JOINT SHIELD PO) Take by mouth 2 (two) times daily.        . Omega-3 Fatty Acids (FISH OIL) 1200 MG CAPS Take by mouth daily.        . polyethylene glycol (MIRALAX / GLYCOLAX) packet Take 17 g by mouth daily as needed.        . senna (SENOKOT) 8.6  MG TABS Take 1 tablet by mouth daily as needed.        . vitamin B-12 (CYANOCOBALAMIN) 100 MCG tablet Take 50 mcg by mouth daily.        . vitamin E 400 UNIT capsule Take 400 Units by mouth daily.        Marland Kitchen warfarin (COUMADIN) 3 MG tablet Use as directed by the Anticoagulation clinic.  30 tablet  3    Allergies  Allergen Reactions  . Latex     Rash.    History   Social History  . Marital Status: Married    Spouse Name: N/A    Number of Children: N/A  . Years of Education: N/A   Occupational History  . RETIRED [employer]   Social History Main Topics  . Smoking status: Never Smoker   . Smokeless tobacco: Not on file  . Alcohol Use: No  . Drug Use: No  . Sexually Active: Not on file   Other Topics Concern  . Not on file   Social History Narrative  . No narrative on file    Family History  Problem Relation Age of Onset  . Heart disease Mother   . Heart disease Father   .  Pneumonia Brother     Review of Systems:  As stated in the HPI and otherwise negative.   BP 152/76  Pulse 83  Resp 18  Ht 5\' 3"  (1.6 m)  Wt 180 lb 12.8 oz (82.01 kg)  BMI 32.03 kg/m2  Physical Examination: General: Well developed, well nourished, NAD HEENT: OP clear, mucus membranes moist SKIN: warm, dry. No rashes. Neuro: No focal deficits Musculoskeletal: Muscle strength 5/5 all ext Psychiatric: Mood and affect normal Neck: No JVD, no carotid bruits, no thyromegaly, no lymphadenopathy. Lungs:Clear bilaterally, no wheezes, rhonci, crackles Cardiovascular: Regular rate and rhythm. No murmurs, gallops or rubs. Abdomen:Soft. Bowel sounds present. Non-tender.  Extremities: No lower extremity edema. Pulses are 2 + in the bilateral DP/PT.  EKG:NSR, rate 64 bpm. Normal EKG.   Echo August 30,2012:  Left ventricle: The cavity size was normal. Wall thickness was     increased in a pattern of mild LVH. Systolic function was normal.     The estimated ejection fraction was in the range of 50% to  55%.     Wall motion was normal; there were no regional wall motion     abnormalities.   - Aortic valve: Mild regurgitation.   - Mitral valve: Mild to moderate regurgitation.   - Left atrium: The atrium was mildly to moderately dilated.   - Pulmonary arteries: Systolic pressure was mildly increased.

## 2011-10-22 NOTE — Patient Instructions (Signed)
Your physician wants you to follow-up in:  6 months. You will receive a reminder letter in the mail two months in advance. If you don't receive a letter, please call our office to schedule the follow-up appointment.   

## 2011-10-26 ENCOUNTER — Telehealth: Payer: Self-pay | Admitting: Cardiovascular Disease

## 2011-10-26 NOTE — Telephone Encounter (Signed)
Fax sent.

## 2011-10-26 NOTE — Telephone Encounter (Signed)
Fax was originally sent to [facility] on October 22, 2011.  I called [facility] and left message for Marchelle Folks that I would refax form today.  I confirmed fax number is [phone].

## 2011-10-26 NOTE — Telephone Encounter (Signed)
Form to go off coumadin for 5 days faxed last week, checking on status, pls call [facility] [place] [facility]

## 2011-11-05 ENCOUNTER — Ambulatory Visit (INDEPENDENT_AMBULATORY_CARE_PROVIDER_SITE_OTHER): Payer: Medicare Other | Admitting: *Deleted

## 2011-11-05 DIAGNOSIS — Z7901 Long term (current) use of anticoagulants: Secondary | ICD-10-CM

## 2011-11-05 DIAGNOSIS — I4891 Unspecified atrial fibrillation: Secondary | ICD-10-CM

## 2011-11-05 LAB — POCT INR: INR: 2.1

## 2011-11-19 ENCOUNTER — Ambulatory Visit (INDEPENDENT_AMBULATORY_CARE_PROVIDER_SITE_OTHER): Payer: Medicare Other | Admitting: *Deleted

## 2011-11-19 DIAGNOSIS — Z7901 Long term (current) use of anticoagulants: Secondary | ICD-10-CM

## 2011-11-19 DIAGNOSIS — I4891 Unspecified atrial fibrillation: Secondary | ICD-10-CM

## 2011-11-19 LAB — POCT INR: INR: 2.4

## 2011-12-28 ENCOUNTER — Ambulatory Visit (INDEPENDENT_AMBULATORY_CARE_PROVIDER_SITE_OTHER): Payer: Medicare Other | Admitting: *Deleted

## 2011-12-28 DIAGNOSIS — Z7901 Long term (current) use of anticoagulants: Secondary | ICD-10-CM

## 2011-12-28 DIAGNOSIS — I4891 Unspecified atrial fibrillation: Secondary | ICD-10-CM

## 2012-01-14 ENCOUNTER — Other Ambulatory Visit: Payer: Self-pay | Admitting: Gastroenterology

## 2012-01-21 ENCOUNTER — Ambulatory Visit (INDEPENDENT_AMBULATORY_CARE_PROVIDER_SITE_OTHER): Payer: Medicare Other | Admitting: *Deleted

## 2012-01-21 DIAGNOSIS — Z7901 Long term (current) use of anticoagulants: Secondary | ICD-10-CM

## 2012-01-21 DIAGNOSIS — I4891 Unspecified atrial fibrillation: Secondary | ICD-10-CM

## 2012-01-21 LAB — POCT INR: INR: 1.3

## 2012-02-01 ENCOUNTER — Ambulatory Visit (INDEPENDENT_AMBULATORY_CARE_PROVIDER_SITE_OTHER): Payer: Medicare Other

## 2012-02-01 DIAGNOSIS — I4891 Unspecified atrial fibrillation: Secondary | ICD-10-CM

## 2012-02-01 DIAGNOSIS — Z7901 Long term (current) use of anticoagulants: Secondary | ICD-10-CM

## 2012-02-01 LAB — POCT INR: INR: 3.1

## 2012-02-21 ENCOUNTER — Encounter (INDEPENDENT_AMBULATORY_CARE_PROVIDER_SITE_OTHER): Payer: Self-pay | Admitting: General Surgery

## 2012-02-21 ENCOUNTER — Telehealth (INDEPENDENT_AMBULATORY_CARE_PROVIDER_SITE_OTHER): Payer: Self-pay

## 2012-02-21 ENCOUNTER — Encounter (INDEPENDENT_AMBULATORY_CARE_PROVIDER_SITE_OTHER): Payer: Medicare Other | Admitting: General Surgery

## 2012-02-21 ENCOUNTER — Ambulatory Visit (INDEPENDENT_AMBULATORY_CARE_PROVIDER_SITE_OTHER): Payer: Medicare Other | Admitting: General Surgery

## 2012-02-21 VITALS — BP 160/90 | HR 77 | Temp 97.4°F | Ht 63.0 in | Wt 187.8 lb

## 2012-02-21 DIAGNOSIS — D122 Benign neoplasm of ascending colon: Secondary | ICD-10-CM

## 2012-02-21 DIAGNOSIS — D126 Benign neoplasm of colon, unspecified: Secondary | ICD-10-CM

## 2012-02-21 NOTE — Progress Notes (Signed)
Patient ID: [name], female   DOB: 31-Mar-1929, 79 y.o.   MRN: [id number]  Chief Complaint  Patient presents with  . Pre-op Exam    eval polyp in cecum    HPI [name] is a 78 y.o. female.  This patient is referred by Dr. Carman Ching for surgical management of an unresectable villous adenoma of the cecum. Dr. Burton Apley is her primary care physician, and Dr. Melene Muller and it is her cardiologist who manages her Coumadin.  The patient is basically asymptomatic from a GI standpoint. She has chronic constipation and takes MiraLax almost daily. Her weight has been stable and her appetite is good. She has a significant past history of a sigmoid colectomy on October 29, 1995 for a stage II, T3, N0 colon cancer. She was followed by Dr. Truett Perna for many years, treated with observation only, no known recurrence to date. On January 12, 2004 she underwent laparoscopic cholecystectomy and repair of umbilical hernia by Dr. Kendrick Ranch. He described adhesions under the umbilicus which did take some time to get down but he was able to do this laparoscopically. She has hemochromatosis. She had  An episode of atrial fibrillation but thinks that she's been in sinus rhythm for some time. HPI  Past Medical History  Diagnosis Date  . HTN (hypertension)   . Environmental allergies   . Arthritis   . Thyroid disease   . History of colon cancer   . Hemochromatosis 1998  . Atrial fibrillation   . Constipation   . Arthritis pain   . Cancer     Past Surgical History  Procedure Date  . Colon surgery 1996    CANCER REMOVAL  . Total hip arthroplasty 2004    RIGHT  . Gallbladder surgery 2005  . Breast surgery 1991    Rt br lump removed-benign    Family History  Problem Relation Age of Onset  . Heart disease Mother   . Stroke Mother   . Heart disease Father   . Pneumonia Brother     Social History History  Substance Use Topics  . Smoking status: Never Smoker   . Smokeless  tobacco: Not on file  . Alcohol Use: No    Allergies  Allergen Reactions  . Latex     Rash.    Current Outpatient Prescriptions  Medication Sig Dispense Refill  . Acetaminophen (ACETAMIN PO) Take by mouth as needed.        Marland Kitchen atorvastatin (LIPITOR) 10 MG tablet Take 1 tablet by mouth Daily.      Marland Kitchen diltiazem (CARDIZEM CD) 120 MG 24 hr capsule Take 1 capsule (120 mg total) by mouth daily.  30 capsule  6  . furosemide (LASIX) 20 MG tablet Take 1 tablet by mouth Daily.      Marland Kitchen levothyroxine (SYNTHROID, LEVOTHROID) 50 MCG tablet Take 1 tablet by mouth Daily.      . Misc Natural Products (OSTEO BI-FLEX JOINT SHIELD PO) Take by mouth 2 (two) times daily.        . Omega-3 Fatty Acids (FISH OIL) 1200 MG CAPS Take by mouth daily.        . polyethylene glycol (MIRALAX / GLYCOLAX) packet Take 17 g by mouth daily as needed.        . senna (SENOKOT) 8.6 MG TABS Take 1 tablet by mouth daily as needed.        . vitamin B-12 (CYANOCOBALAMIN) 100 MCG tablet Take 50 mcg by mouth daily.        Marland Kitchen  vitamin E 400 UNIT capsule Take 400 Units by mouth daily.        Marland Kitchen warfarin (COUMADIN) 3 MG tablet Use as directed by the Anticoagulation clinic.  30 tablet  3  . atenolol (TENORMIN) 25 MG tablet         Review of Systems Review of Systems  Constitutional: Negative for fever, chills and unexpected weight change.  HENT: Negative for hearing loss, congestion, sore throat, trouble swallowing and voice change.   Eyes: Negative for visual disturbance.  Respiratory: Negative for cough and wheezing.   Cardiovascular: Negative for chest pain, palpitations and leg swelling.  Gastrointestinal: Negative for nausea, vomiting, abdominal pain, diarrhea, constipation, blood in stool, abdominal distention and anal bleeding.  Genitourinary: Negative for hematuria, vaginal bleeding and difficulty urinating.  Musculoskeletal: Negative for arthralgias.  Skin: Negative for rash and wound.  Neurological: Negative for seizures,  syncope and headaches.  Hematological: Negative for adenopathy. Does not bruise/bleed easily.  Psychiatric/Behavioral: Negative for confusion.    Blood pressure 160/90, pulse 77, temperature 97.4 F (36.3 C), temperature source Temporal, height 5\' 3"  (1.6 m), weight 187 lb 12.8 oz (85.186 kg), SpO2 94.00%.  Physical Exam Physical Exam  Constitutional: She is oriented to person, place, and time. She appears well-developed and well-nourished. No distress.  HENT:  Head: Normocephalic and atraumatic.  Nose: Nose normal.  Mouth/Throat: No oropharyngeal exudate.  Eyes: Conjunctivae and EOM are normal. Pupils are equal, round, and reactive to light. Left eye exhibits no discharge. No scleral icterus.  Neck: Neck supple. No JVD present. No tracheal deviation present. No thyromegaly present.  Cardiovascular: Normal rate, regular rhythm, normal heart sounds and intact distal pulses.   No murmur heard.      Appears to be in NSR.  DP pulses palpable.  Pulmonary/Chest: Effort normal and breath sounds normal. No respiratory distress. She has no wheezes. She has no rales. She exhibits no tenderness.  Abdominal: Soft. Bowel sounds are normal. She exhibits no distension and no mass. There is no tenderness. There is no rebound and no guarding.    Musculoskeletal: She exhibits no edema and no tenderness.  Lymphadenopathy:    She has no cervical adenopathy.  Neurological: She is alert and oriented to person, place, and time. She exhibits normal muscle tone. Coordination normal.  Skin: Skin is warm. No rash noted. She is not diaphoretic. No erythema. No pallor.  Psychiatric: She has a normal mood and affect. Her behavior is normal. Judgment and thought content normal.    Data Reviewed I have reviewed her old operative notes, Dr. Cristopher Peru notes from the cancer Center, Dr. Randa Evens recent colonoscopy report, the recent pathology report. Assessment    3 cm villous adenoma of the cecum. There is no  dysplasia but this is a precancerous histology.  History stage II, T3 N0 adenocarcinoma of the sigmoid colon, no known recurrence today.  Status post laparoscopic cholecystectomy and umbilical hernia repair.  Stable benign pulmonary nodules by CT  Stable benign liver nodules by CT  Hemochromatosis  Coumadin therapy for paroxysmal atrial fibrillation. Patient questions whether she needs Coumadin long-term.    Plan    We talked about different therapies including surgery, endoscopic resection, and surveillance. She clearly would like to go ahead with surgery to have this removed.  She will be scheduled for laparoscopic assisted right colectomy. She is at high risk for conversion to an open procedure. This is acceptable to all concerned.  She will undergo a bowel prep preop  We  will ask for cardiac clearance with Dr. Melene Muller and ask to stop the Coumadin 5 days preop.  I discussed the indications and details of surgery with the patient, her husband and her daughters. Risks and complications have been outlined, including but not limited to bleeding, infection, conversion to open laparotomy, injury to adjacent organs requiring repair, wound hernia, cardiac, pulmonary, and thromboembolic problems. She understands easy she is well. Her questions were answered. She agrees with the plan.       Angelia Mould. Derrell Lolling, M.D., [facility] General and Minimally invasive Surgery Breast and Colorectal Surgery Office:   [phone] Pager:   [phone]  02/21/2012, 11:37 AM

## 2012-02-21 NOTE — Telephone Encounter (Signed)
Request for cardiac clearance and need to d/c coumadin faxed to cardiologist. Orders are written and being held awaiting clearance.

## 2012-02-21 NOTE — Patient Instructions (Signed)
He'll have a benign adenomatous polyp of the right colon. He will be scheduled for an operation to remove the right colon.  He will undergo a bowel prep at home the day before surgery.  You will need to stop your Coumadin 5 days prior to the surgery. We will ask Dr. Verne Carrow to approve this.  Open Colon Resection Colon resection is surgery to take out part or all of the large intestine (colon). It is also called a colectomy.  LET YOUR CAREGIVER KNOW ABOUT:  Any allergies.   All medicines you are taking, including:   Herbs, eyedrops, over-the-counter medicines and creams.   Blood thinners (anticoagulants), aspirin, or other drugs that could affect blood clotting.   Use of steroids (by mouth or as creams).   Previous problems with anesthetics, including local anesthetics.   Possibility of pregnancy, if this applies.   Any history of blood clots.   Any history of bleeding or other blood problems.   Previous surgery.   Smoking history.   Any recent symptoms of colds or infections.   Other health problems.  RISKS AND COMPLICATIONS  There are always risks for surgery with medicine that makes you sleep (general anesthetic). They include breathing and heart problems. However, this risk is low for people who have no other health problems. Other complications from colon resection may include:  An infection developing in the area where the surgery was done.   Problems with the incisions including:   Bleeding from an incision.   The wound reopening.   Tissues from inside the abdomen bulging through the incision (hernia).   Bleeding inside the abdomen.   Reopening of the colon where it was stitched or stapled together. This is a serious complication. Another procedure may be needed to fix the problem.   Damage to other organs in the abdomen.   A blood clot forming in a vein and traveling to the lungs.   Future blockage of the colon.  BEFORE THE PROCEDURE  A  medical evaluation will be done. This may include:   A physical exam.   Blood tests.   A test to check the heart's rhythm (electrocardiogram).   X-rays, such as magnetic resonance imaging (MRI). This can take pictures of the colon. An MRI uses a magnet, radio waves, and a computer to create a picture of your colon.   Talking with the person who will be in charge of the medicine during the procedure. An open colon resection requires general anesthesia. Ask what you can expect.   Two weeks before the surgery, stop using aspirin and nonsteroidal anti-inflammatory drugs (NSAIDs) for pain relief. This includes prescription drugs and over-the-counter drugs. Also stop taking vitamin E.   If you take blood thinners, ask your caregiver when you should stop taking them.   Do not eat or drink anything for 8 to 12 hours before the surgery. Ask your caregiver if it is okay to take any needed medicines with a sip of water.   Ask your caregiver if you need to arrive early before the procedure.   On the day of your surgery, your caregiver will need to know the last time you had anything to eat or drink. This includes water, gum, and candy.   Make arrangements in advance for someone to drive you home.  PROCEDURE Colon resection can take 1 to 4 hours.  Small monitors will be put on your body. They are used to check your heart, blood pressure, and oxygen level.  You will be given an intravenous line (IV). A needle will be inserted in your arm. Medicine will be able to flow directly into your body through this needle.   You might be given a medicine to help you relax (sedative).   You will be given a general anesthetic.   A tube may be put in through your nose. It is called a nasogastric tube. It is used to remove stomach juices after surgery until the intestines start working again.   Once you are asleep, the surgeon will make an incision in the abdomen about 6 to 12 inches long.   Clamps are put  on both ends of the diseased part of the colon.   The part of the intestine between the clamps is removed.   If possible, the ends of the healthy colon that remain will be stitched or stapled together.   Sometimes, a colostomy is needed. For a colostomy:   An opening (stoma) to the outside is made through the abdomen.   The end of the colon is brought through the opening. It is stitched to the skin.   A bag is attached to the opening. Waste will drain into this bag. The bag is removable.   The colostomy can be temporary or permanent. Ask your surgeon what to expect.   The incision from the colon resection will be closed with stitches or staples.  AFTER THE PROCEDURE  You will stay in a recovery area until the anesthesia has worn off. Your blood pressure and pulse will be checked every so often. Then you will be taken to a hospital room.   You will continue to get fluids through the IV for awhile. The IV will be taken out when the colon starts working again.   You will gradually go back to a normal diet.   Some pain is normal after a colon resection. Ask for pain medicine if the pain becomes too much.   You will be urged to get up and start walking after 1 or 2 days, at the most.   If you had a colostomy, your caregiver will explain how it works and what you will need to do.   Most people spend 3 to 7 days in the hospital after this surgery. Ask your caregiver what to expect.  Document Released: 10/14/2009 Document Revised: 08/29/2011 Document Reviewed: 05/04/2011 [facility] Patient Information 2012 [place], [place].Laparoscopic Colon Resection Laparoscopic colon resection is a relatively new procedure and is not performed in all centers. It may be done to remove a piece of the colon (large intestine) that may be sore and reddened (inflamed). It may be done to remove a portion of bowel that is blocked. The intestine may be blocked because of colon cancer. It is sometimes used to treat  diseases of the bowel in which there are multiple small outgrowths from the bowel wall (polyps), which may predispose a person to cancer. LET YOUR CAREGIVER KNOW ABOUT:  Allergies.   Medications taken including herbs, eye drops, over the counter medications, and creams.   Use of steroids (by mouth or creams).   Previous problems with anesthetics or novocaine.   Possibility of pregnancy, if this applies.   History of blood clots (thrombophlebitis).   History of bleeding or blood problems.   Previous surgery.   Other health problems.  RISKS AND COMPLICATIONS Some problems, which occur following this procedure, include:  Infection: A germ starts growing in the wound. This can usually be treated with medicine that  kills germs (antibiotics).   Bleeding following surgery may be a complication of almost all surgeries. Your surgeon takes every precaution to keep this from happening.   Damage to other organs may occur. If damage to other organs or excessive bleeding should occur it may be necessary to convert the laparoscopic procedure into an open abdominal (belly) procedure. This means the surgery is performed by opening the abdomen and performing the surgery under direct vision. Scarring from previous surgeries or disease may also be a cause to change this procedure to an open abdominal operation.   Sometimes a leak can occur in the line where the bowel was sewn together after the portion of bowel was removed.   It is possible for the bowel to become obstructed in the area where it was sewn together. When this happens, it is sometimes necessary to operate again to repair this. This may be accomplished using the laparoscope or opening the abdomen and operating in the usual manner without the laparoscope.  BEFORE THE PROCEDURE You should be present 2 hours prior to your procedure or as instructed.  PROCEDURE  Laparoscopic means a laparoscope (a small pencil sized telescope) is used. You  are made to sleep with medicine (anesthetized). Your surgeon inflates your belly (abdomen) with a needle like device (trocar and cannula). The inflation is done with a harmless gas (carbon dioxide). This makes your organs easier to see. The laparoscope is inserted into your abdomen through a small slit (incision) that allows your surgeon to see into the abdomen. Other small instruments, such as probes and operating instruments, are inserted into the abdomen through other small openings (ports). These ports allow the surgeon to perform the operation. Often surgeons attach a video camera to the laparoscope to enlarge the view. During the procedure the portion of bowel to be removed is taken out through one of the ports. A port may have to be enlarged if the bowel is too large to be removed. In this case a small incision will be made and some times the bowel is reconnected (anastamosis) outside the abdomen. After the procedure, the gas is released, and your incisions are closed with stitches (sutures). Because these incisions are small (usually less than one-half inch), there is usually minimal discomfort following the procedure. AFTER THE PROCEDURE The recovery time, if there are no problems, is shortened compared to regular surgery. You will rest in a recovery room until you are stable and doing well. Following this, barring other problems you will be allowed to return to your room. Recovery times vary depending on what is found at surgery, the age of the patient, general health, etc. SEEK IMMEDIATE MEDICAL CARE IF:   There is redness, swelling, or increasing pain in the wound area.   Pus is coming from the wound.   An unexplained oral temperature above 102 F (38.9 C) develops or as directed.   You notice a foul smell coming from the wound or dressing.   There is a breaking open of a wound (edges not staying together) after sutures have been removed.   You develop increasing abdominal pain.    Document Released: 03/09/2003 Document Revised: 08/29/2011 Document Reviewed: 01/16/2008 [facility] Patient Information 2012 [place], [place].

## 2012-02-22 ENCOUNTER — Telehealth: Payer: Self-pay | Admitting: *Deleted

## 2012-02-22 NOTE — Telephone Encounter (Signed)
Request for cardiac clearance received from Dr. Derrell Lolling. Dr. Clifton James would like to see pt before clearing for surgery.  I called and spoke with pt and gave her this information. She will come in to see Dr. Clifton James on March 03, 2012 at 10:00

## 2012-02-25 ENCOUNTER — Other Ambulatory Visit: Payer: Self-pay | Admitting: Cardiovascular Disease

## 2012-02-26 ENCOUNTER — Ambulatory Visit (INDEPENDENT_AMBULATORY_CARE_PROVIDER_SITE_OTHER): Payer: Medicare Other | Admitting: Pharmacist

## 2012-02-26 DIAGNOSIS — I4891 Unspecified atrial fibrillation: Secondary | ICD-10-CM

## 2012-02-26 DIAGNOSIS — Z7901 Long term (current) use of anticoagulants: Secondary | ICD-10-CM

## 2012-02-26 LAB — POCT INR: INR: 2.1

## 2012-02-27 ENCOUNTER — Encounter (INDEPENDENT_AMBULATORY_CARE_PROVIDER_SITE_OTHER): Payer: Self-pay

## 2012-03-03 ENCOUNTER — Encounter: Payer: Self-pay | Admitting: Cardiovascular Disease

## 2012-03-03 ENCOUNTER — Ambulatory Visit (INDEPENDENT_AMBULATORY_CARE_PROVIDER_SITE_OTHER): Payer: Medicare Other | Admitting: Cardiovascular Disease

## 2012-03-03 VITALS — BP 151/77 | HR 67 | Ht 63.0 in | Wt 185.0 lb

## 2012-03-03 DIAGNOSIS — Z01810 Encounter for preprocedural cardiovascular examination: Secondary | ICD-10-CM

## 2012-03-03 DIAGNOSIS — I4891 Unspecified atrial fibrillation: Secondary | ICD-10-CM

## 2012-03-03 NOTE — Assessment & Plan Note (Signed)
She is maintaining NSR. She is on coumadin. Continue coumadin and Cardizem. Coumadin will be held before her surgery for 5-7 days.

## 2012-03-03 NOTE — Patient Instructions (Signed)
Your physician wants you to follow-up in:  6 months. You will receive a reminder letter in the mail two months in advance. If you don't receive a letter, please call our office to schedule the follow-up appointment.   

## 2012-03-03 NOTE — Assessment & Plan Note (Signed)
She is doing well from a cardiac standpoint. She is known to have normal LV function. Maintaining normal sinus rhythm. It is ok to proceed with surgery as planned and hold coumadin for 5-7 days before her procedure.

## 2012-03-03 NOTE — Progress Notes (Signed)
History of Present Illness: 78 yo WF with history of colon cancer, hypothyroidism, HLD, HTN, hemochromatosis, atrial fibrillation here today for cardiac follow up. When I met her in September of 2012, she was in sinus rhythm. She has been continued on coumadin. Her echo showed mild aortic valve insufficiency and mild to moderate mitral regurgitation in August 2012.   She is here today for cardiac follow up and for cardiac risk assessment with upcoming surgery. She is feeling well. No chest pain or SOB. EKG is NSR today. No dizziness, near syncope or syncope. She has recently been seen by Dr. Derrell Lolling from surgery and she needs to have a colon polyp removed. This is planned for   Primary Care Physician: Dr. Burton Apley.   Past Medical History  Diagnosis Date  . HTN (hypertension)   . Environmental allergies   . Arthritis   . Thyroid disease   . History of colon cancer   . Hemochromatosis 1998  . Atrial fibrillation   . Constipation   . Arthritis pain   . Cancer     Past Surgical History  Procedure Date  . Colon surgery 1996    CANCER REMOVAL  . Total hip arthroplasty 2004    RIGHT  . Gallbladder surgery 2005  . Breast surgery 1991    Rt br lump removed-benign    Current Outpatient Prescriptions  Medication Sig Dispense Refill  . Acetaminophen (ACETAMIN PO) Take by mouth as needed.        Marland Kitchen amLODipine (NORVASC) 2.5 MG tablet 1 tab daily      . atorvastatin (LIPITOR) 10 MG tablet Take 1 tablet by mouth Daily.      Marland Kitchen diltiazem (CARDIZEM CD) 120 MG 24 hr capsule Take 1 capsule (120 mg total) by mouth daily.  30 capsule  6  . furosemide (LASIX) 20 MG tablet Take 1 tablet by mouth Daily.      Marland Kitchen levothyroxine (SYNTHROID, LEVOTHROID) 50 MCG tablet Take 1 tablet by mouth Daily.      . Misc Natural Products (OSTEO BI-FLEX JOINT SHIELD PO) Take by mouth 2 (two) times daily.        . Omega-3 Fatty Acids (FISH OIL) 1200 MG CAPS Take by mouth daily.        . polyethylene glycol  (MIRALAX / GLYCOLAX) packet Take 17 g by mouth daily as needed.        . senna (SENOKOT) 8.6 MG TABS Take 1 tablet by mouth daily as needed.        . vitamin B-12 (CYANOCOBALAMIN) 100 MCG tablet Take 50 mcg by mouth daily.        . vitamin E 400 UNIT capsule Take 400 Units by mouth daily.        Marland Kitchen warfarin (COUMADIN) 3 MG tablet USE AS DIRECTED BY THE ANTICOAGULATION CLINIC.  30 tablet  3    Allergies  Allergen Reactions  . Latex     Rash.    History   Social History  . Marital Status: Married    Spouse Name: N/A    Number of Children: N/A  . Years of Education: N/A   Occupational History  . RETIRED [employer]   Social History Main Topics  . Smoking status: Never Smoker   . Smokeless tobacco: Not on file  . Alcohol Use: No  . Drug Use: No  . Sexually Active: Not on file   Other Topics Concern  . Not on file   Social History Narrative  .  No narrative on file    Family History  Problem Relation Age of Onset  . Heart disease Mother   . Stroke Mother   . Heart disease Father   . Pneumonia Brother     Review of Systems:  As stated in the HPI and otherwise negative.   BP 151/77  Pulse 67  Ht 5\' 3"  (1.6 m)  Wt 185 lb (83.915 kg)  BMI 32.77 kg/m2  Physical Examination: General: Well developed, well nourished, NAD HEENT: OP clear, mucus membranes moist SKIN: warm, dry. No rashes. Neuro: No focal deficits Musculoskeletal: Muscle strength 5/5 all ext Psychiatric: Mood and affect normal Neck: No JVD, no carotid bruits, no thyromegaly, no lymphadenopathy. Lungs:Clear bilaterally, no wheezes, rhonci, crackles Cardiovascular: Regular rate and rhythm. No murmurs, gallops or rubs. Abdomen:Soft. Bowel sounds present. Non-tender.  Extremities: No lower extremity edema. Pulses are 2 + in the bilateral DP/PT.  EKG: NSR, rate 67.

## 2012-03-04 ENCOUNTER — Telehealth (INDEPENDENT_AMBULATORY_CARE_PROVIDER_SITE_OTHER): Payer: Self-pay

## 2012-03-04 NOTE — Telephone Encounter (Signed)
Cardiac clearance here and orders to surgery schedulers. 

## 2012-03-05 ENCOUNTER — Encounter (INDEPENDENT_AMBULATORY_CARE_PROVIDER_SITE_OTHER): Payer: Self-pay

## 2012-03-22 IMAGING — CR DG CHEST 1V PORT
1 series · 1 of 1 positions shown · non-contrast
Comparison: 02/03/2007

CLINICAL DATA: Palpitations.

PORTABLE CHEST - 1 VIEW

[view not recorded]
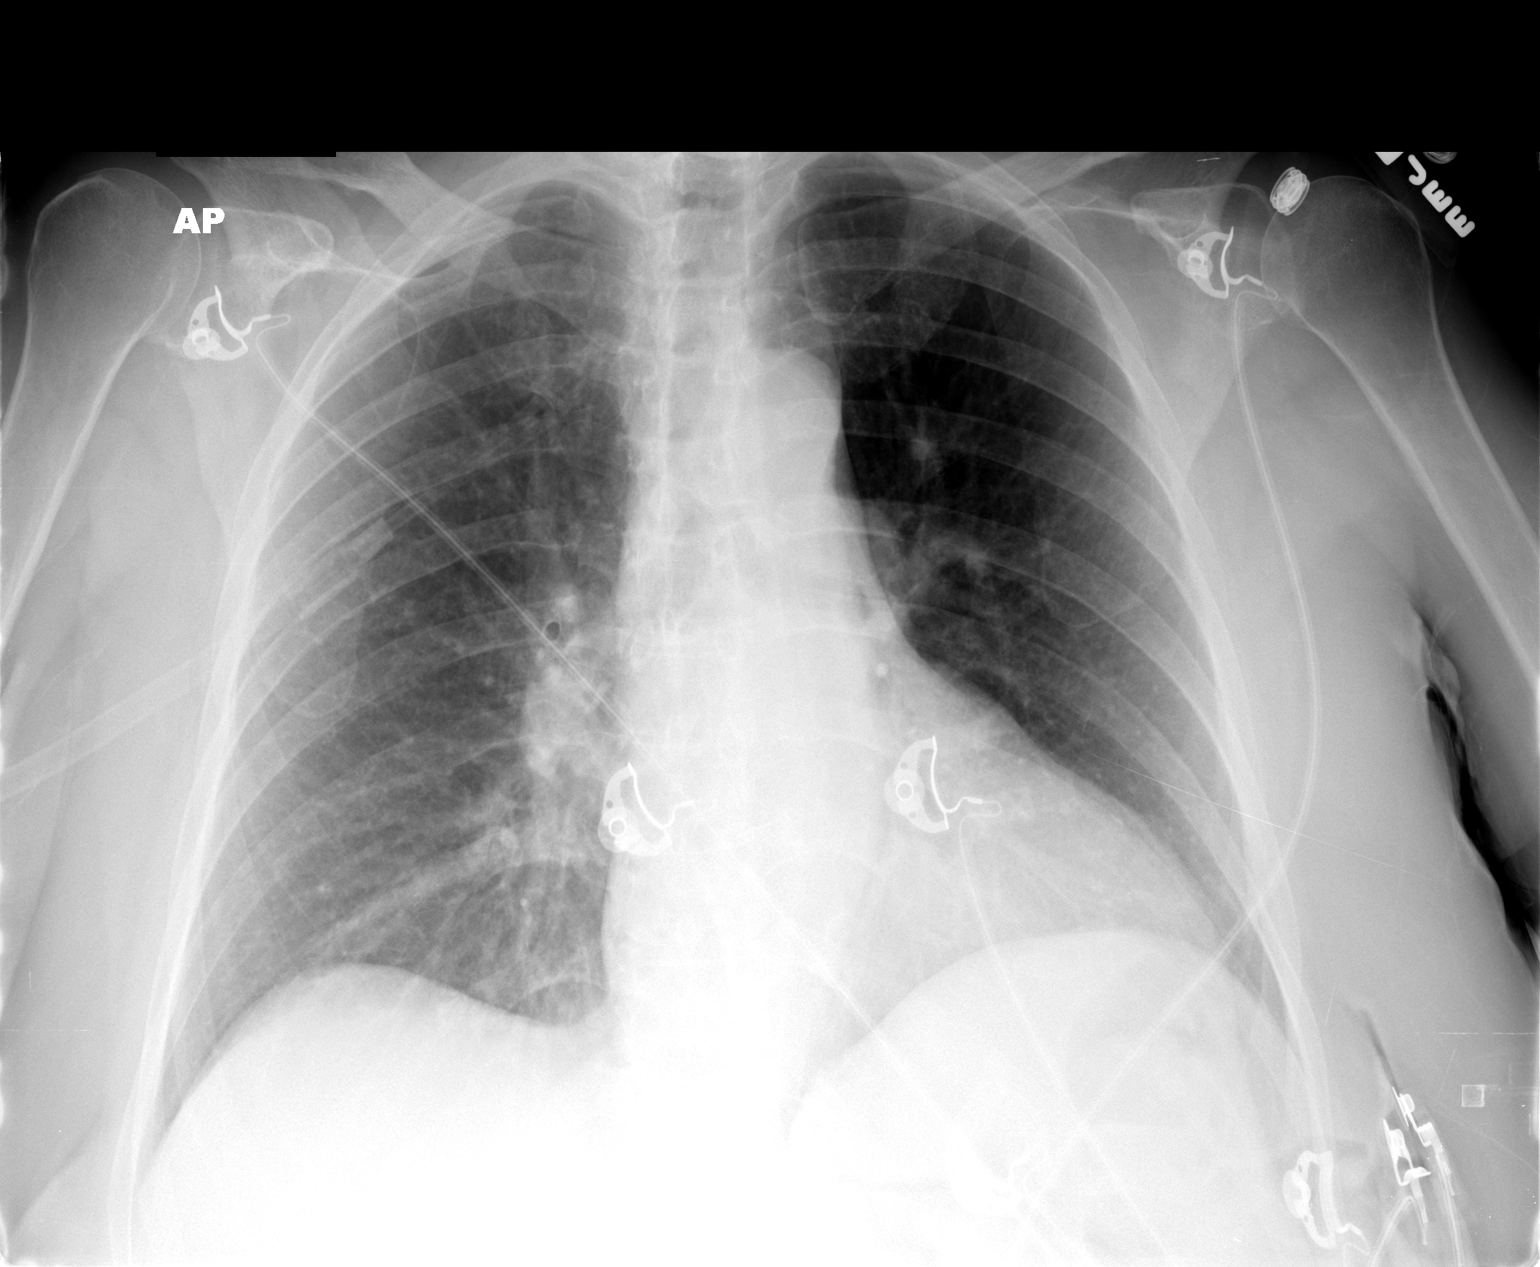

[1 of 1 positions shown; findings below may reference images not displayed]

FINDINGS: Mild medial right lung base opacity.  No focal
consolidation otherwise.  Heart size is upper normal limits to
mildly enlarged.  Mediastinal contours are otherwise within normal
limits.  No acute osseous abnormality.  No pneumothorax.
IMPRESSION: Mild medial right lung base opacity is nonspecific however favored
to represent atelectasis or scarring.

Heart size upper normal limits to mildly enlarged.

## 2012-03-23 IMAGING — CR DG CHEST 1V PORT
1 series · 1 of 1 positions shown · non-contrast
Comparison: 08/28/2011

CLINICAL DATA: Right PICC line

PORTABLE CHEST - 1 VIEW

[AP]
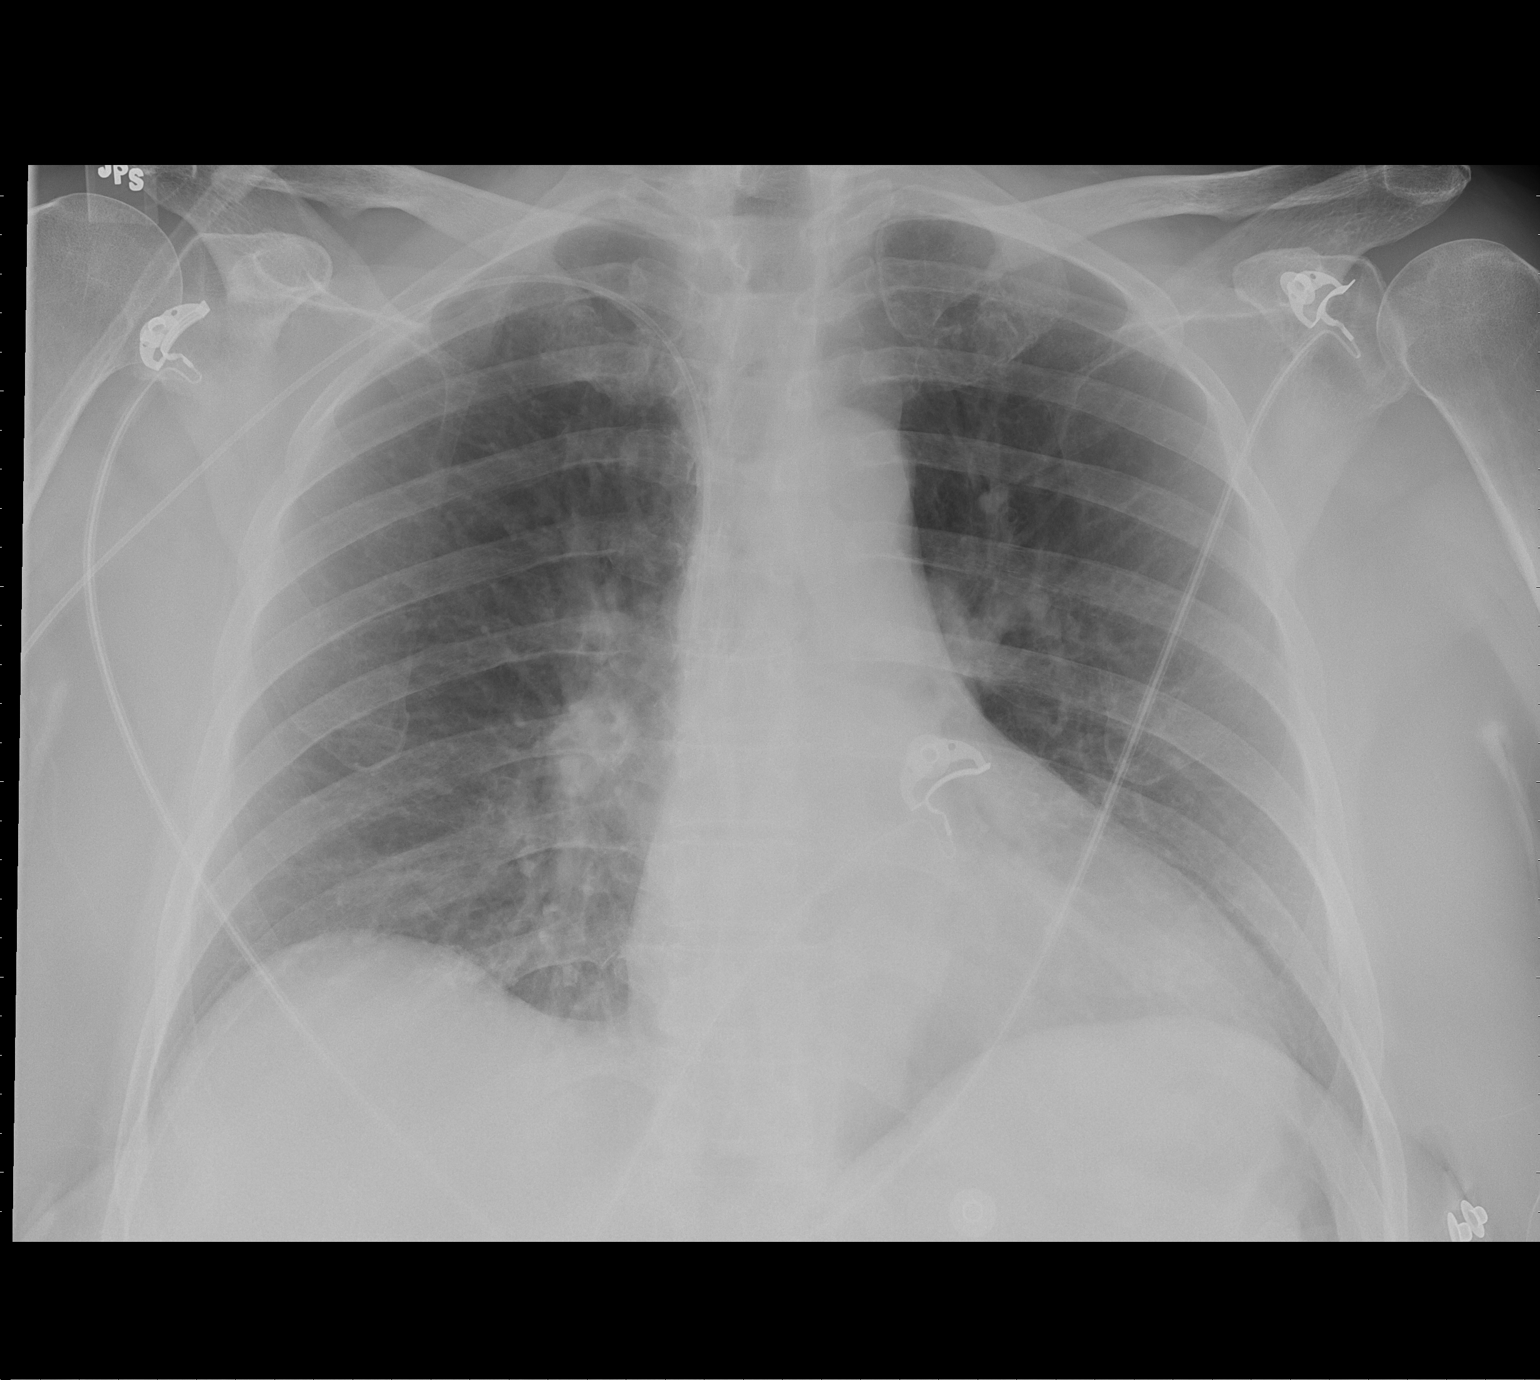

[1 of 1 positions shown; findings below may reference images not displayed]

FINDINGS: Interval placement of a right PICC with its tip in the
lower SVC just above the cavoatrial junction.  No pneumothorax.

Lungs are essentially clear. No pleural effusion or pneumothorax.

Cardiomediastinal silhouette is within normal limits.
IMPRESSION: Interval placement of a right PICC with its tip in the lower SVC.
No pneumothorax.

## 2012-03-24 ENCOUNTER — Encounter (HOSPITAL_COMMUNITY): Payer: Self-pay | Admitting: Pharmacy Technician

## 2012-03-25 ENCOUNTER — Ambulatory Visit (INDEPENDENT_AMBULATORY_CARE_PROVIDER_SITE_OTHER): Payer: Medicare Other | Admitting: *Deleted

## 2012-03-25 DIAGNOSIS — Z7901 Long term (current) use of anticoagulants: Secondary | ICD-10-CM

## 2012-03-25 DIAGNOSIS — I4891 Unspecified atrial fibrillation: Secondary | ICD-10-CM

## 2012-03-25 LAB — POCT INR: INR: 2.1

## 2012-04-01 ENCOUNTER — Encounter (HOSPITAL_COMMUNITY)
Admission: RE | Admit: 2012-04-01 | Discharge: 2012-04-01 | Disposition: A | Discharge status: Home / Self Care | Payer: Medicare Other | Source: Ambulatory Visit | Attending: General Surgery | Admitting: General Surgery

## 2012-04-01 ENCOUNTER — Encounter (HOSPITAL_COMMUNITY): Payer: Self-pay

## 2012-04-01 HISTORY — DX: Nausea with vomiting, unspecified: R11.2

## 2012-04-01 HISTORY — DX: Other specified postprocedural states: Z98.890

## 2012-04-01 HISTORY — DX: Asymptomatic varicose veins of unspecified lower extremity: I83.90

## 2012-04-01 LAB — COMPREHENSIVE METABOLIC PANEL
ALT: 14 U/L (ref 0–35)
AST: 19 U/L (ref 0–37)
Alkaline Phosphatase: 112 U/L (ref 39–117)
CO2: 27 mEq/L (ref 19–32)
Chloride: 104 mEq/L (ref 96–112)
Creatinine, Ser: 0.61 mg/dL (ref 0.50–1.10)
GFR calc non Af Amer: 83 mL/min — ABNORMAL LOW (ref >90)
Potassium: 3.9 mEq/L (ref 3.5–5.1)
Sodium: 141 mEq/L (ref 135–145)
Total Bilirubin: 0.8 mg/dL (ref 0.3–1.2)

## 2012-04-01 LAB — URINALYSIS, ROUTINE W REFLEX MICROSCOPIC
Bilirubin Urine: NEGATIVE
Glucose, UA: NEGATIVE mg/dL
Ketones, ur: NEGATIVE mg/dL
Specific Gravity, Urine: 1.01 (ref 1.005–1.030)
pH: 7 (ref 5.0–8.0)

## 2012-04-01 LAB — CBC
MCV: 95.9 fL (ref 78.0–100.0)
Platelets: 186 10*3/uL (ref 150–400)
RBC: 4.61 MIL/uL (ref 3.87–5.11)
WBC: 6.2 10*3/uL (ref 4.0–10.5)

## 2012-04-01 LAB — SURGICAL PCR SCREEN: Staphylococcus aureus: POSITIVE — AB

## 2012-04-01 LAB — URINE MICROSCOPIC-ADD ON

## 2012-04-01 MED ORDER — CHLORHEXIDINE GLUCONATE 4 % EX LIQD: 1.0000 "application " | Freq: Once | CUTANEOUS | Status: DC

## 2012-04-01 NOTE — Pre-Procedure Instructions (Addendum)
[address] JERMEKA SCHLOTTERBECK  04/01/2012   Your procedure is scheduled on:  April 08, 2012 (Tuesday)  Report to [facility] at 7:30 AM.  Call this number if you have problems the morning of surgery: [phone]   Remember:   Do not eat food:After Midnight.  May have clear liquids: up to 4 Hours before arrival.  Clear liquids include soda, tea, black coffee, apple or grape juice, broth.  Take these medicines the morning of surgery with A SIP OF WATER: norvasc,cardizem,synthroid, STOP COUMADIN,FISH OIL,VIT E AS DIRECTED   Do not wear jewelry, make-up or nail polish.  Do not wear lotions, powders, or perfumes. You may wear deodorant.  Do not shave 48 hours prior to surgery.  Do not bring valuables to the hospital.  Contacts, dentures or bridgework may not be worn into surgery.  Leave suitcase in the car. After surgery it may be brought to your room.  For patients admitted to the hospital, checkout time is 11:00 AM the day of discharge.   Patients discharged the day of surgery will not be allowed to drive home.  Name and phone number of your driver: na  Special Instructions: CHG Shower Use Special Wash: 1/2 bottle night before surgery and 1/2 bottle morning of surgery.   Please read over the following fact sheets that you were given: Pain Booklet, MRSA Information and Surgical Site Infection Prevention

## 2012-04-02 NOTE — Consult Note (Signed)
Anesthesia: Patient is a 78 year old female scheduled for a laparoscopic assisted right colectomy, possible open on 04/08/12 for colon cancer.  History includes post-operative N/V, HTN, arthritis, hemochromatosis, afib, hypothyroidism.  Her Cardiologist is Dr. Clifton James.  She was last seen on 03/03/12 for routine follow-up and preoperative evaluation.  EKG then showed NSR.  He felt she was acceptable risk to proceed.  Echo on 08/30/11 showed: Mild LVH, normal LV systolic function, EF 50-55%, mild AR, mild to moderate MR, LA mildly to moderately dilated, pulmonary arteries systolic pressure was mildly increased.  She had a normal CXR on 04/01/12.   Labs noted.  H/H 14.7/44.2.  She will need a repeat PT/PTT on the day of surgery.    Plan to proceed if no significant change in her status and coags reasonable on the day of surgery.

## 2012-04-07 MED ORDER — ALVIMOPAN 12 MG PO CAPS
12.0000 mg | ORAL_CAPSULE | Freq: Once | ORAL | Status: AC
  Administered 2012-04-08: 12 mg via ORAL
  Filled 2012-04-07: qty 1

## 2012-04-07 MED ORDER — HEPARIN SODIUM (PORCINE) 5000 UNIT/ML IJ SOLN
5000.0000 [IU] | Freq: Once | INTRAMUSCULAR | Status: AC
  Administered 2012-04-08: 5000 [IU] via SUBCUTANEOUS
  Filled 2012-04-07: qty 1

## 2012-04-07 MED ORDER — SODIUM CHLORIDE 0.9 % IV SOLN
1.0000 g | INTRAVENOUS | Status: AC
  Administered 2012-04-08: 1 g via INTRAVENOUS
  Filled 2012-04-07: qty 1

## 2012-04-07 NOTE — H&P (Signed)
[name]    MRN: [id number]   Description: 78 year old female  Provider: Ernestene Mention, MD  Department: [facility]      Diagnoses     Benign neoplasm of ascending colon   - Primary    211.3          Vitals   BP Pulse Temp(Src) Ht Wt BMI    160/90  77  97.4 F (36.3 C) (Temporal)  5\' 3"  (1.6 m)  187 lb 12.8 oz (85.186 kg)  33.27 kg/m2     SpO2 - 94%                History and Physical    Ernestene Mention, MD   Patient ID: [name], female   DOB: Jul 13, 1929, 78 y.o.   MRN: [id number]         HPI [name] is a 78 y.o. female.  This patient is referred by Dr. Carman Ching for surgical management of an unresectable villous adenoma of the cecum. Dr. Burton Apley is her primary care physician, and Dr. Melene Muller and it is her cardiologist who manages her Coumadin.  The patient is basically asymptomatic from a GI standpoint. She has chronic constipation and takes MiraLax almost daily. Her weight has been stable and her appetite is good. She has a significant past history of a sigmoid colectomy on October 29, 1995 for a stage II, T3, N0 colon cancer. She was followed by Dr. Truett Perna for many years, treated with observation only, no known recurrence to date. On January 12, 2004 she underwent laparoscopic cholecystectomy and repair of umbilical hernia by Dr. Kendrick Ranch. He described adhesions under the umbilicus which did take some time to get down but he was able to do this laparoscopically. She has hemochromatosis. She had  An episode of atrial fibrillation but thinks that she's been in sinus rhythm for some time.     Past Medical History   Diagnosis  Date   .  HTN (hypertension)     .  Environmental allergies     .  Arthritis     .  Thyroid disease     .  History of colon cancer     .  Hemochromatosis  1998   .  Atrial fibrillation     .  Constipation     .  Arthritis pain     .  Cancer         Past Surgical History   Procedure  Date     .  Colon surgery  1996       CANCER REMOVAL   .  Total hip arthroplasty  2004       RIGHT   .  Gallbladder surgery  2005   .  Breast surgery  1991       Rt br lump removed-benign       Family History   Problem  Relation  Age of Onset   .  Heart disease  Mother     .  Stroke  Mother     .  Heart disease  Father     .  Pneumonia  Brother        Social History History   Substance Use Topics   .  Smoking status:  Never Smoker    .  Smokeless tobacco:  Not on file   .  Alcohol Use:  No       Allergies  Allergen  Reactions   .  Latex         Rash.       Current Outpatient Prescriptions   Medication  Sig  Dispense  Refill   .  Acetaminophen (ACETAMIN PO)  Take by mouth as needed.           Marland Kitchen  atorvastatin (LIPITOR) 10 MG tablet  Take 1 tablet by mouth Daily.         Marland Kitchen  diltiazem (CARDIZEM CD) 120 MG 24 hr capsule  Take 1 capsule (120 mg total) by mouth daily.   30 capsule   6   .  furosemide (LASIX) 20 MG tablet  Take 1 tablet by mouth Daily.         Marland Kitchen  levothyroxine (SYNTHROID, LEVOTHROID) 50 MCG tablet  Take 1 tablet by mouth Daily.         .  Misc Natural Products (OSTEO BI-FLEX JOINT SHIELD PO)  Take by mouth 2 (two) times daily.           .  Omega-3 Fatty Acids (FISH OIL) 1200 MG CAPS  Take by mouth daily.           .  polyethylene glycol (MIRALAX / GLYCOLAX) packet  Take 17 g by mouth daily as needed.           .  senna (SENOKOT) 8.6 MG TABS  Take 1 tablet by mouth daily as needed.           .  vitamin B-12 (CYANOCOBALAMIN) 100 MCG tablet  Take 50 mcg by mouth daily.           .  vitamin E 400 UNIT capsule  Take 400 Units by mouth daily.           Marland Kitchen  warfarin (COUMADIN) 3 MG tablet  Use as directed by the Anticoagulation clinic.   30 tablet   3   .  atenolol (TENORMIN) 25 MG tablet               Review of Systems  Constitutional: Negative for fever, chills and unexpected weight change.  HENT: Negative for hearing loss, congestion, sore throat, trouble swallowing  and voice change.   Eyes: Negative for visual disturbance.  Respiratory: Negative for cough and wheezing.   Cardiovascular: Negative for chest pain, palpitations and leg swelling.  Gastrointestinal: Negative for nausea, vomiting, abdominal pain, diarrhea, constipation, blood in stool, abdominal distention and anal bleeding.  Genitourinary: Negative for hematuria, vaginal bleeding and difficulty urinating.  Musculoskeletal: Negative for arthralgias.  Skin: Negative for rash and wound.  Neurological: Negative for seizures, syncope and headaches.  Hematological: Negative for adenopathy. Does not bruise/bleed easily.  Psychiatric/Behavioral: Negative for confusion.    Blood pressure 160/90, pulse 77, temperature 97.4 F (36.3 C), temperature source Temporal, height 5\' 3"  (1.6 m), weight 187 lb 12.8 oz (85.186 kg), SpO2 94.00%.   Physical Exam  Constitutional: She is oriented to person, place, and time. She appears well-developed and well-nourished. No distress.  HENT:   Head: Normocephalic and atraumatic.   Nose: Nose normal.   Mouth/Throat: No oropharyngeal exudate.  Eyes: Conjunctivae and EOM are normal. Pupils are equal, round, and reactive to light. Left eye exhibits no discharge. No scleral icterus.  Neck: Neck supple. No JVD present. No tracheal deviation present. No thyromegaly present.  Cardiovascular: Normal rate, regular rhythm, normal heart sounds and intact distal pulses.    No murmur heard.  Appears to be in NSR.  DP pulses palpable.  Pulmonary/Chest: Effort normal and breath sounds normal. No respiratory distress. She has no wheezes. She has no rales. She exhibits no tenderness.  Abdominal: Soft. Bowel sounds are normal. She exhibits no distension and no mass. There is no tenderness. There is no rebound and no guarding. Long midline scar. Multiple laparoscopy trocar sites. No hernias.   Musculoskeletal: She exhibits no edema and no tenderness.  Lymphadenopathy:    She  has no cervical adenopathy.  Neurological: She is alert and oriented to person, place, and time. She exhibits normal muscle tone. Coordination normal.  Skin: Skin is warm. No rash noted. She is not diaphoretic. No erythema. No pallor.  Psychiatric: She has a normal mood and affect. Her behavior is normal. Judgment and thought content normal.    Data Reviewed I have reviewed her old operative notes, Dr. Cristopher Peru notes from the cancer Center, Dr. Randa Evens recent colonoscopy report, the recent pathology report.   Assessment   3 cm villous adenoma of the cecum. There is no dysplasia but this is a precancerous histology.  History stage II, T3 N0 adenocarcinoma of the sigmoid colon, no known recurrence today.  Status post laparoscopic cholecystectomy and umbilical hernia repair.  Stable benign pulmonary nodules by CT  Stable benign liver nodules by CT  Hemochromatosis  Coumadin therapy for paroxysmal atrial fibrillation. Patient questions whether she needs Coumadin long-term.   Plan We talked about different therapies including surgery, endoscopic resection, and surveillance. She clearly would like to go ahead with surgery to have this removed.  She will be scheduled for laparoscopic assisted right colectomy. She is at high risk for conversion to an open procedure. This is acceptable to all concerned.  She will undergo a bowel prep preop  We will ask for cardiac clearance with Dr. Melene Muller and ask to stop the Coumadin 5 days preop.  I discussed the indications and details of surgery with the patient, her husband and her daughters. Risks and complications have been outlined, including but not limited to bleeding, infection, conversion to open laparotomy, injury to adjacent organs requiring repair, wound hernia, cardiac, pulmonary, and thromboembolic problems. She understands easy she is well. Her questions were answered. She agrees with the plan.       Angelia Mould.  Derrell Lolling, M.D., [facility] General and Minimally invasive Surgery Breast and Colorectal Surgery Office:   [phone] Pager:   [phone]

## 2012-04-08 ENCOUNTER — Inpatient Hospital Stay (HOSPITAL_COMMUNITY)
Admission: RE | Admit: 2012-04-08 | Discharge: 2012-04-14 | DRG: 331 | Disposition: A | Discharge status: Home / Self Care | Payer: Medicare Other | Source: Ambulatory Visit | Attending: General Surgery | Admitting: General Surgery

## 2012-04-08 ENCOUNTER — Encounter (HOSPITAL_COMMUNITY)
Admission: RE | Disposition: A | Discharge status: Home / Self Care | Payer: Self-pay | Source: Ambulatory Visit | Attending: General Surgery

## 2012-04-08 ENCOUNTER — Encounter (HOSPITAL_COMMUNITY): Payer: Self-pay | Admitting: *Deleted

## 2012-04-08 ENCOUNTER — Encounter (HOSPITAL_COMMUNITY): Payer: Self-pay | Admitting: Vascular Surgery

## 2012-04-08 ENCOUNTER — Ambulatory Visit (HOSPITAL_COMMUNITY): Payer: Medicare Other | Admitting: Vascular Surgery

## 2012-04-08 DIAGNOSIS — D134 Benign neoplasm of liver: Secondary | ICD-10-CM

## 2012-04-08 DIAGNOSIS — Z79899 Other long term (current) drug therapy: Secondary | ICD-10-CM

## 2012-04-08 DIAGNOSIS — Z96649 Presence of unspecified artificial hip joint: Secondary | ICD-10-CM

## 2012-04-08 DIAGNOSIS — D126 Benign neoplasm of colon, unspecified: Secondary | ICD-10-CM

## 2012-04-08 DIAGNOSIS — M129 Arthropathy, unspecified: Secondary | ICD-10-CM | POA: Diagnosis present

## 2012-04-08 DIAGNOSIS — I10 Essential (primary) hypertension: Secondary | ICD-10-CM | POA: Diagnosis present

## 2012-04-08 DIAGNOSIS — E876 Hypokalemia: Secondary | ICD-10-CM | POA: Diagnosis not present

## 2012-04-08 DIAGNOSIS — K5909 Other constipation: Secondary | ICD-10-CM | POA: Diagnosis present

## 2012-04-08 DIAGNOSIS — Z7901 Long term (current) use of anticoagulants: Secondary | ICD-10-CM

## 2012-04-08 DIAGNOSIS — I4891 Unspecified atrial fibrillation: Secondary | ICD-10-CM | POA: Diagnosis present

## 2012-04-08 DIAGNOSIS — D122 Benign neoplasm of ascending colon: Secondary | ICD-10-CM

## 2012-04-08 DIAGNOSIS — K660 Peritoneal adhesions (postprocedural) (postinfection): Secondary | ICD-10-CM

## 2012-04-08 DIAGNOSIS — R339 Retention of urine, unspecified: Secondary | ICD-10-CM | POA: Diagnosis not present

## 2012-04-08 DIAGNOSIS — Z01812 Encounter for preprocedural laboratory examination: Secondary | ICD-10-CM

## 2012-04-08 DIAGNOSIS — D135 Benign neoplasm of extrahepatic bile ducts: Secondary | ICD-10-CM

## 2012-04-08 DIAGNOSIS — K439 Ventral hernia without obstruction or gangrene: Secondary | ICD-10-CM | POA: Diagnosis present

## 2012-04-08 DIAGNOSIS — Z01818 Encounter for other preprocedural examination: Secondary | ICD-10-CM

## 2012-04-08 DIAGNOSIS — K7689 Other specified diseases of liver: Secondary | ICD-10-CM | POA: Diagnosis present

## 2012-04-08 DIAGNOSIS — I498 Other specified cardiac arrhythmias: Secondary | ICD-10-CM | POA: Diagnosis not present

## 2012-04-08 DIAGNOSIS — Z85038 Personal history of other malignant neoplasm of large intestine: Secondary | ICD-10-CM

## 2012-04-08 HISTORY — PX: LIVER BIOPSY: SHX301

## 2012-04-08 LAB — TYPE AND SCREEN: ABO/RH(D): B POS

## 2012-04-08 SURGERY — COLECTOMY, RIGHT, LAPAROSCOPIC
Anesthesia: General | Site: Abdomen | Laterality: Right | Wound class: Clean Contaminated

## 2012-04-08 MED ORDER — ONDANSETRON HCL 4 MG PO TABS: 4.0000 mg | ORAL_TABLET | Freq: Four times a day (QID) | ORAL | Status: DC | PRN

## 2012-04-08 MED ORDER — SUFENTANIL CITRATE 50 MCG/ML IV SOLN
INTRAVENOUS | Status: DC | PRN
  Administered 2012-04-08: 5 ug via INTRAVENOUS
  Administered 2012-04-08: 15 ug via INTRAVENOUS
  Administered 2012-04-08: 5 ug via INTRAVENOUS

## 2012-04-08 MED ORDER — DILTIAZEM HCL ER COATED BEADS 120 MG PO CP24
120.0000 mg | ORAL_CAPSULE | Freq: Every day | ORAL | Status: DC
  Administered 2012-04-09 – 2012-04-13 (×5): 120 mg via ORAL
  Filled 2012-04-08 (×6): qty 1

## 2012-04-08 MED ORDER — ONDANSETRON HCL 4 MG/2ML IJ SOLN: 4.0000 mg | Freq: Once | INTRAMUSCULAR | Status: DC | PRN

## 2012-04-08 MED ORDER — LEVOTHYROXINE SODIUM 25 MCG PO TABS
25.0000 ug | ORAL_TABLET | Freq: Every day | ORAL | Status: DC
  Administered 2012-04-09 – 2012-04-14 (×6): 25 ug via ORAL
  Filled 2012-04-08 (×7): qty 1

## 2012-04-08 MED ORDER — HYDROMORPHONE HCL PF 1 MG/ML IJ SOLN
0.2500 mg | INTRAMUSCULAR | Status: DC | PRN
  Administered 2012-04-08: 0.5 mg via INTRAVENOUS

## 2012-04-08 MED ORDER — PROPOFOL 10 MG/ML IV EMUL
INTRAVENOUS | Status: DC | PRN
  Administered 2012-04-08: 140 mg via INTRAVENOUS

## 2012-04-08 MED ORDER — ALVIMOPAN 12 MG PO CAPS
12.0000 mg | ORAL_CAPSULE | Freq: Two times a day (BID) | ORAL | Status: DC
  Administered 2012-04-09 – 2012-04-12 (×8): 12 mg via ORAL
  Filled 2012-04-08 (×10): qty 1

## 2012-04-08 MED ORDER — HYDROCODONE-ACETAMINOPHEN 5-325 MG PO TABS
1.0000 | ORAL_TABLET | ORAL | Status: DC | PRN
  Administered 2012-04-09: 2 via ORAL
  Administered 2012-04-10 – 2012-04-14 (×5): 1 via ORAL
  Filled 2012-04-08: qty 1
  Filled 2012-04-08: qty 2
  Filled 2012-04-08 (×2): qty 1
  Filled 2012-04-08: qty 2
  Filled 2012-04-08: qty 1

## 2012-04-08 MED ORDER — LACTATED RINGERS IV SOLN
INTRAVENOUS | Status: DC
  Administered 2012-04-08: 09:00:00 via INTRAVENOUS

## 2012-04-08 MED ORDER — ROCURONIUM BROMIDE 100 MG/10ML IV SOLN
INTRAVENOUS | Status: DC | PRN
  Administered 2012-04-08 (×3): 10 mg via INTRAVENOUS
  Administered 2012-04-08: 40 mg via INTRAVENOUS

## 2012-04-08 MED ORDER — GLYCOPYRROLATE 0.2 MG/ML IJ SOLN
INTRAMUSCULAR | Status: DC | PRN
  Administered 2012-04-08: 0.6 mg via INTRAVENOUS

## 2012-04-08 MED ORDER — SODIUM CHLORIDE 0.9 % IV SOLN
INTRAVENOUS | Status: DC | PRN
  Administered 2012-04-08: 10:00:00 via INTRAVENOUS

## 2012-04-08 MED ORDER — POTASSIUM CHLORIDE IN NACL 20-0.9 MEQ/L-% IV SOLN
INTRAVENOUS | Status: DC
  Administered 2012-04-08 – 2012-04-13 (×10): via INTRAVENOUS
  Filled 2012-04-08 (×18): qty 1000

## 2012-04-08 MED ORDER — LACTATED RINGERS IV SOLN
INTRAVENOUS | Status: DC | PRN
  Administered 2012-04-08 (×2): via INTRAVENOUS

## 2012-04-08 MED ORDER — LIDOCAINE HCL (CARDIAC) 20 MG/ML IV SOLN
INTRAVENOUS | Status: DC | PRN
  Administered 2012-04-08: 40 mg via INTRAVENOUS

## 2012-04-08 MED ORDER — CHLORHEXIDINE GLUCONATE 0.12 % MT SOLN
15.0000 mL | Freq: Two times a day (BID) | OROMUCOSAL | Status: DC
  Administered 2012-04-08 – 2012-04-12 (×8): 15 mL via OROMUCOSAL
  Filled 2012-04-08 (×7): qty 15

## 2012-04-08 MED ORDER — HEPARIN SODIUM (PORCINE) 5000 UNIT/ML IJ SOLN
5000.0000 [IU] | Freq: Three times a day (TID) | INTRAMUSCULAR | Status: DC
  Administered 2012-04-08 – 2012-04-14 (×17): 5000 [IU] via SUBCUTANEOUS
  Filled 2012-04-08 (×20): qty 1

## 2012-04-08 MED ORDER — ONDANSETRON HCL 4 MG/2ML IJ SOLN: INTRAMUSCULAR | Status: DC | PRN

## 2012-04-08 MED ORDER — SODIUM CHLORIDE 0.9 % IR SOLN
Status: DC | PRN
  Administered 2012-04-08: 1000 mL

## 2012-04-08 MED ORDER — 0.9 % SODIUM CHLORIDE (POUR BTL) OPTIME
TOPICAL | Status: DC | PRN
  Administered 2012-04-08: 4000 mL

## 2012-04-08 MED ORDER — NEOSTIGMINE METHYLSULFATE 1 MG/ML IJ SOLN
INTRAMUSCULAR | Status: DC | PRN
  Administered 2012-04-08: 4 mg via INTRAVENOUS

## 2012-04-08 MED ORDER — BUPIVACAINE-EPINEPHRINE PF 0.5-1:200000 % IJ SOLN
INTRAMUSCULAR | Status: DC | PRN
  Administered 2012-04-08: 9 mL

## 2012-04-08 MED ORDER — ONDANSETRON HCL 4 MG/2ML IJ SOLN
4.0000 mg | Freq: Four times a day (QID) | INTRAMUSCULAR | Status: DC | PRN
  Administered 2012-04-10 – 2012-04-12 (×2): 4 mg via INTRAVENOUS
  Filled 2012-04-08: qty 2

## 2012-04-08 MED ORDER — BIOTENE DRY MOUTH MT LIQD
15.0000 mL | Freq: Two times a day (BID) | OROMUCOSAL | Status: DC
  Administered 2012-04-09 – 2012-04-12 (×6): 15 mL via OROMUCOSAL

## 2012-04-08 MED ORDER — AMLODIPINE BESYLATE 2.5 MG PO TABS
2.5000 mg | ORAL_TABLET | Freq: Every day | ORAL | Status: DC
  Administered 2012-04-09 – 2012-04-13 (×5): 2.5 mg via ORAL
  Filled 2012-04-08 (×6): qty 1

## 2012-04-08 MED ORDER — EPHEDRINE SULFATE 50 MG/ML IJ SOLN
INTRAMUSCULAR | Status: DC | PRN
  Administered 2012-04-08 (×3): 5 mg via INTRAVENOUS

## 2012-04-08 MED ORDER — SODIUM CHLORIDE 0.9 % IV SOLN
1.0000 g | INTRAVENOUS | Status: AC
  Filled 2012-04-08: qty 1

## 2012-04-08 MED ORDER — HYDROMORPHONE HCL PF 1 MG/ML IJ SOLN
1.0000 mg | INTRAMUSCULAR | Status: DC | PRN
  Administered 2012-04-08 – 2012-04-11 (×2): 1 mg via INTRAVENOUS
  Filled 2012-04-08 (×2): qty 1

## 2012-04-08 SURGICAL SUPPLY — 69 items
APPLICATOR COTTON TIP 6IN STRL (MISCELLANEOUS) ×12 IMPLANT
BLADE SURG 10 STRL SS (BLADE) ×4 IMPLANT
BLADE SURG ROTATE 9660 (MISCELLANEOUS) IMPLANT
CANISTER SUCTION 2500CC (MISCELLANEOUS) ×4 IMPLANT
CELLS DAT CNTRL 66122 CELL SVR (MISCELLANEOUS) ×3 IMPLANT
CHLORAPREP W/TINT 26ML (MISCELLANEOUS) ×4 IMPLANT
CLOTH BEACON ORANGE TIMEOUT ST (SAFETY) ×4 IMPLANT
COVER SURGICAL LIGHT HANDLE (MISCELLANEOUS) ×4 IMPLANT
DRAPE LAPAROSCOPIC ABDOMINAL (DRAPES) ×4 IMPLANT
DRAPE PROXIMA HALF (DRAPES) IMPLANT
DRAPE WARM FLUID 44X44 (DRAPE) ×4 IMPLANT
ELECT BLADE 6.5 EXT (BLADE) IMPLANT
ELECT CAUTERY BLADE 6.4 (BLADE) ×12 IMPLANT
ELECT REM PT RETURN 9FT ADLT (ELECTROSURGICAL) ×4
ELECTRODE REM PT RTRN 9FT ADLT (ELECTROSURGICAL) ×3 IMPLANT
FILTER SMOKE EVAC LAPAROSHD (FILTER) ×4 IMPLANT
GLOVE BIOGEL PI IND STRL 6.5 (GLOVE) ×3 IMPLANT
GLOVE BIOGEL PI IND STRL 8 (GLOVE) ×3 IMPLANT
GLOVE BIOGEL PI INDICATOR 6.5 (GLOVE) ×1
GLOVE BIOGEL PI INDICATOR 8 (GLOVE) ×1
GLOVE EUDERMIC 7 POWDERFREE (GLOVE) ×8 IMPLANT
GLOVE SURG SS PI 6.5 STRL IVOR (GLOVE) ×4 IMPLANT
GLOVE SURG SS PI 7.0 STRL IVOR (GLOVE) ×8 IMPLANT
GLOVE SURG SS PI 8.0 STRL IVOR (GLOVE) ×8 IMPLANT
GOWN PREVENTION PLUS XLARGE (GOWN DISPOSABLE) ×4 IMPLANT
GOWN STRL NON-REIN LRG LVL3 (GOWN DISPOSABLE) ×8 IMPLANT
KIT ROOM TURNOVER OR (KITS) ×4 IMPLANT
LEGGING LITHOTOMY PAIR STRL (DRAPES) IMPLANT
LIGASURE IMPACT 36 18CM CVD LR (INSTRUMENTS) ×8 IMPLANT
NS IRRIG 1000ML POUR BTL (IV SOLUTION) ×8 IMPLANT
PACK GENERAL/GYN (CUSTOM PROCEDURE TRAY) ×4 IMPLANT
PAD ARMBOARD 7.5X6 YLW CONV (MISCELLANEOUS) ×8 IMPLANT
PENCIL BUTTON HOLSTER BLD 10FT (ELECTRODE) ×4 IMPLANT
RELOAD PROXIMATE 75MM BLUE (ENDOMECHANICALS) ×8 IMPLANT
RTRCTR WOUND ALEXIS 18CM MED (MISCELLANEOUS) ×4
SCALPEL HARMONIC ACE (MISCELLANEOUS) ×4 IMPLANT
SLEEVE ENDOPATH XCEL 5M (ENDOMECHANICALS) ×12 IMPLANT
SLEEVE SURGEON STRL (DRAPES) ×4 IMPLANT
SPECIMEN JAR X LARGE (MISCELLANEOUS) ×4 IMPLANT
SPONGE GAUZE 4X4 12PLY (GAUZE/BANDAGES/DRESSINGS) ×4 IMPLANT
SPONGE LAP 18X18 X RAY DECT (DISPOSABLE) IMPLANT
STAPLER GUN LINEAR PROX 60 (STAPLE) ×4 IMPLANT
STAPLER PROXIMATE 75MM BLUE (STAPLE) ×4 IMPLANT
STAPLER VISISTAT 35W (STAPLE) ×4 IMPLANT
SUCTION POOLE TIP (SUCTIONS) ×4 IMPLANT
SURGILUBE 2OZ TUBE FLIPTOP (MISCELLANEOUS) IMPLANT
SUT NOV 1 T60/GS (SUTURE) IMPLANT
SUT NOVA 1 T20/GS 25DT (SUTURE) ×12 IMPLANT
SUT NOVA NAB GS-21 0 18 T12 DT (SUTURE) ×4 IMPLANT
SUT PDS AB 1 CT  36 (SUTURE)
SUT PDS AB 1 CT 36 (SUTURE) IMPLANT
SUT PROLENE 0 CT 1 CR/8 (SUTURE) IMPLANT
SUT PROLENE 2 0 CT2 30 (SUTURE) IMPLANT
SUT PROLENE 2 0 KS (SUTURE) IMPLANT
SUT SILK 2 0 SH CR/8 (SUTURE) ×4 IMPLANT
SUT SILK 2 0 TIES 10X30 (SUTURE) ×4 IMPLANT
SUT SILK 3 0 SH CR/8 (SUTURE) ×12 IMPLANT
SUT SILK 3 0 TIES 10X30 (SUTURE) ×4 IMPLANT
TAPE CLOTH SURG 4X10 WHT LF (GAUZE/BANDAGES/DRESSINGS) ×4 IMPLANT
TOWEL OR 17X24 6PK STRL BLUE (TOWEL DISPOSABLE) ×4 IMPLANT
TOWEL OR 17X26 10 PK STRL BLUE (TOWEL DISPOSABLE) ×4 IMPLANT
TRAY FOLEY CATH 14FRSI W/METER (CATHETERS) ×4 IMPLANT
TROCAR XCEL NON-BLD 11X100MML (ENDOMECHANICALS) ×4 IMPLANT
TROCAR XCEL NON-BLD 5MMX100MML (ENDOMECHANICALS) ×4 IMPLANT
TUBE CONNECTING 12X1/4 (SUCTIONS) ×4 IMPLANT
TUBING INSUFFLATOR W/FILTER (MISCELLANEOUS) ×4 IMPLANT
UNDERPAD 30X30 INCONTINENT (UNDERPADS AND DIAPERS) IMPLANT
WATER STERILE IRR 1000ML POUR (IV SOLUTION) IMPLANT
YANKAUER SUCT BULB TIP NO VENT (SUCTIONS) ×8 IMPLANT

## 2012-04-08 NOTE — OR Nursing (Signed)
Histology called at 1222. Informed specimen being walked down, to be examined for gross only and adenoma identified and MD called. Report called at 1229.

## 2012-04-08 NOTE — Op Note (Signed)
Patient Name:           [name]   Date of Surgery:        04/08/2012  Pre op Diagnosis:      Villous adenoma of the cecum  Post op Diagnosis:    Villous adenoma of the cecum, left liver lobe mass, ventral hernia, extensive intra-abdominal adhesions  Procedure:                 Laparoscopic lysis of adhesions requiring 60 minutes, laparoscopic right colectomy, laparoscopic biopsy mass left lobe of liver, repair of ventral hernia  Surgeon:                     Angelia Mould. Derrell Lolling, M.D., FACS  Assistant:                      Angela Adam, M.D., [facility]  Operative Indications:   This 78 y.o. patient is referred by Dr. Carman Ching for surgical management of an unresectable villous adenoma of the cecum. Dr. Burton Apley is her primary care physician, and Dr. Melene Muller and it is her cardiologist who manages her Coumadin.  The patient is basically asymptomatic from a GI standpoint. She has chronic constipation and takes MiraLax almost daily. Her weight has been stable and her appetite is good. She has a significant past history of a sigmoid colectomy on October 29, 1995 for a stage II, T3, N0 colon cancer. She was followed by Dr. Truett Perna for many years, treated with observation only, no known recurrence to date. On January 12, 2004 she underwent laparoscopic cholecystectomy and repair of umbilical hernia by Dr. Kendrick Ranch. He described adhesions under the umbilicus which did take some time to get down but he was able to do this laparoscopically. She has hemochromatosis. She had An episode of atrial fibrillation but thinks that she's been in sinus rhythm for some time. Recent colonoscopy showed a flat, unresectable 3 cm mass in the cecum opposite the ileocecal valve. Biopsy showed villous adenoma, but there was no high-grade dysplasia. She was counseled as an outpatient. She has undergone a bowel prep at home. She is operated on electively.   Operative Findings:       The patient had a 2 x 4  cm adenomatous mass in the cecum which grossly looked benign. She had a wrinkled benign-appearing mass on the anterior edge of the left lobe of the liver which suggested a collapsed thick wall cyst. This was biopsied. She had extensive intra-abdominal adhesions which required 60 minutes to take down. The gallbladder was surgically absent. She had a 3-4 cm ventral hernia in the midline incision around the umbilicus which was closed as part of the procedure  Procedure in Detail:          Following the induction of general endotracheal anesthesia a Foley catheter was placed. Intravenous antibiotics were given. The abdomen was prepped and draped in a sterile fashion. Surgical time out was performed.  I placed a 5 mm trocar in the left subcostal region. Trocar entry was atraumatic. Pneumoperitoneum was created. There were lots of adhesions. I could see to put 3 more 5 mm trochars in the left abdomen and suprapubic area. We spent 60 minutes taking down all the adhesions. We put an 11 mm trocar to the left of the umbilicus as well.  The mass in the left lobe of the liver was photographed and biopsied with a cup forceps and sent for  routine histology. The biopsy area was cauterized and there was no bleeding at the completion of the case.  Using sharp dissection, blunt dissection and harmonic scalpel I mobilized the terminal ileum, right colon, hepatic flexure and right transverse colon. I carefully took the dissection from lateral to medial. Identified and preserved the duodenum. I dissected the fallopian tube and ovary off of the terminal ileum. I eventually had mobilized everything past the midline and had good mobilization. At this point in time I converted to an open procedure. I made approximately 8 cm incision through the old scar a little bit above and a little bit below the umbilicus. I incorporated the ventral hernia into this incision. A wound protector was placed. The terminal ileum right colon and  transverse colon delivered through the wound. The terminal ileum was transected with a GIA stapling device. The transverse colon was transected with a GIA stapling device, transecting just to the right of the middle colic vessels. Mesenteric vessels were skeletonized, isolated and small vessels were divided with the LigaSure device and 2 of the larger vessels were clamped divided and ligated with 2-0 silk suture ligatures. The specimen was sent to the lab and Dr. Luisa Hart examined it and identified the villous adenoma and  widely negative margins. An anastomosis was created between the terminal ileum and mid transverse colon with a GIA stapling device. The common defect was closed with a TA 60 stapling device. We changed our instruments and gloves at this point. Placed further silk sutures to reinforce the staple line a critical points. We closed the mesentery with figure-of-eight sutures of 2-0 silk. We tested the anastomosis. There was no leakage. I can palpate no more than 2 fingerbreadths diameter of the anastomosis. The color was  pink and viable on both sides. We copiously irrigated the abdomen sucking the fluid out with a pool sucker. We returned the intestine to its anatomic position. We closed the midline fascia and repaired the hernia with numerous interrupted sutures of #1 Novofil.  Pneumoperitoneum was created again. We looked around and there was no bleeding. We aspirated some of the irrigation fluid from the pelvis and from the subphrenic space. Everything looked fine. The liver looked clean. The trocars were removed. The pneumoperitoneum was released. The skin incisions were irrigated with saline and the skin incisions closed with skin staples. Kling bandages were placed. EBL 100 cc complications none. Counts correct.    Angelia Mould. Derrell Lolling, M.D., FACS General and Minimally Invasive Surgery Breast and Colorectal Surgery  04/08/2012 1:07 PM

## 2012-04-08 NOTE — Anesthesia Preprocedure Evaluation (Signed)
Anesthesia Evaluation  Patient identified by MRN, date of birth, ID band Patient awake    Reviewed: Allergy & Precautions, H&P , Patient's Chart, lab work & pertinent test results  Airway       Dental  (+) Teeth Intact   Pulmonary  breath sounds clear to auscultation        Cardiovascular Rhythm:Irregular Rate:Normal     Neuro/Psych    GI/Hepatic   Endo/Other    Renal/GU      Musculoskeletal   Abdominal   Peds  Hematology   Anesthesia Other Findings   Reproductive/Obstetrics                           Anesthesia Physical Anesthesia Plan  ASA: III  Anesthesia Plan: General   Post-op Pain Management:    Induction: Intravenous  Airway Management Planned: Oral ETT  Additional Equipment:   Intra-op Plan:   Post-operative Plan: Extubation in OR  Informed Consent: I have reviewed the patients History and Physical, chart, labs and discussed the procedure including the risks, benefits and alternatives for the proposed anesthesia with the patient or authorized representative who has indicated his/her understanding and acceptance.   Dental advisory given  Plan Discussed with:   Anesthesia Plan Comments: (Colon Ca Htn Chronic Afib,  INR now 1.2  Plan GA  Kipp Brood, MD)        Anesthesia Quick Evaluation

## 2012-04-08 NOTE — Anesthesia Procedure Notes (Signed)
Procedure Name: Intubation Date/Time: 04/08/2012 10:14 AM Performed by: Darcey Nora B Pre-anesthesia Checklist: Patient identified, Emergency Drugs available, Suction available and Patient being monitored Patient Re-evaluated:Patient Re-evaluated prior to inductionOxygen Delivery Method: Circle system utilized Preoxygenation: Pre-oxygenation with 100% oxygen Intubation Type: IV induction Ventilation: Mask ventilation without difficulty Grade View: Grade II Tube type: Oral Tube size: 7.5 mm Number of attempts: 1 Airway Equipment and Method: Stylet Dental Injury: Teeth and Oropharynx as per pre-operative assessment

## 2012-04-08 NOTE — Preoperative (Signed)
Beta Blockers   Reason not to administer Beta Blockers:Not Applicable 

## 2012-04-08 NOTE — Anesthesia Postprocedure Evaluation (Signed)
  Anesthesia Post-op Note  Patient: [name]  Procedure(s) Performed: Procedure(s) (LRB): LAPAROSCOPIC RIGHT COLECTOMY (Right) LAPAROSCOPIC LYSIS OF ADHESIONS (N/A) LIVER BIOPSY ()  Patient Location: PACU  Anesthesia Type: General  Level of Consciousness: awake, alert  and oriented  Airway and Oxygen Therapy: Patient Spontanous Breathing and Patient connected to nasal cannula oxygen  Post-op Pain: mild  Post-op Assessment: Post-op Vital signs reviewed and Patient's Cardiovascular Status Stable  Post-op Vital Signs: stable  Complications: No apparent anesthesia complications

## 2012-04-08 NOTE — Transfer of Care (Signed)
Immediate Anesthesia Transfer of Care Note  Patient: [name]  Procedure(s) Performed: Procedure(s) (LRB): LAPAROSCOPIC RIGHT COLECTOMY (Right) LAPAROSCOPIC LYSIS OF ADHESIONS (N/A) LIVER BIOPSY ()  Patient Location: PACU  Anesthesia Type: General  Level of Consciousness: awake, alert , oriented and patient cooperative  Airway & Oxygen Therapy: Patient Spontanous Breathing and Patient connected to nasal cannula oxygen  Post-op Assessment: Report given to PACU RN, Post -op Vital signs reviewed and stable and Patient moving all extremities  Post vital signs: Reviewed and stable  Complications: No apparent anesthesia complications

## 2012-04-08 NOTE — Interval H&P Note (Signed)
History and Physical Interval Note:  04/08/2012 9:41 AM  [name]  has presented today for surgery, with the diagnosis of colon mass  The goals of treatment and the  various methods of treatment have been discussed with the patient and family. After consideration of risks, benefits and other options for treatment, the patient has consented to  Procedure(s) (LRB): LAPAROSCOPIC RIGHT COLECTOMY (Right), POSSIBLE OPEN, PARTIAL COLECTOMY (Right) as a surgical intervention .  The patients' history has been reviewed today , patient examined today , no change in status, stable for surgery.  I have reviewed the patients' chart and labs.  Questions were answered to the patient's satisfaction.     Ernestene Mention

## 2012-04-08 NOTE — Interval H&P Note (Signed)
History and Physical Interval Note:  04/08/2012 9:40 AM  [name]  has presented today for surgery, with the diagnosis of colon mass  The various methods of treatment have been discussed with the patient and family. After consideration of risks, benefits and other options for treatment, the patient has consented to  Procedure(s) (LRB): LAPAROSCOPIC RIGHT COLECTOMY (Right) PARTIAL COLECTOMY (Right), as a surgical intervention .  The patients' history has been reviewed, patient examined, no change in status, stable for surgery.  I have reviewed the patients' chart and labs.  Questions were answered to the patient's satisfaction.     Ernestene Mention

## 2012-04-08 NOTE — Progress Notes (Signed)
UR of chart complete.  

## 2012-04-09 ENCOUNTER — Encounter (HOSPITAL_COMMUNITY): Payer: Self-pay | Admitting: General Surgery

## 2012-04-09 LAB — BASIC METABOLIC PANEL
Calcium: 9.3 mg/dL (ref 8.4–10.5)
Creatinine, Ser: 0.59 mg/dL (ref 0.50–1.10)
GFR calc non Af Amer: 84 mL/min — ABNORMAL LOW (ref >90)
Glucose, Bld: 112 mg/dL — ABNORMAL HIGH (ref 70–99)
Sodium: 141 mEq/L (ref 135–145)

## 2012-04-09 LAB — CBC
MCH: 32 pg (ref 26.0–34.0)
Platelets: 165 10*3/uL (ref 150–400)
RBC: 3.94 MIL/uL (ref 3.87–5.11)
RDW: 12.4 % (ref 11.5–15.5)

## 2012-04-09 MED ORDER — POTASSIUM CHLORIDE 10 MEQ/100ML IV SOLN
10.0000 meq | INTRAVENOUS | Status: AC
  Administered 2012-04-09 (×4): 10 meq via INTRAVENOUS
  Filled 2012-04-09 (×4): qty 100

## 2012-04-09 NOTE — Progress Notes (Addendum)
1 Day Post-Op  Subjective: Alert. Stable. Urine output good. Denies chest pain or shortness of breath. Denies nausea or vomiting. Was able to sleep.  Objective: Vital signs in last 24 hours: Temp:  [97.7 F (36.5 C)-99.7 F (37.6 C)] 98.6 F (37 C) (04/10 0503) Pulse Rate:  [75-91] 88  (04/10 0503) Resp:  [16-19] 16  (04/10 0503) BP: (111-155)/(52-75) 122/53 mmHg (04/10 0503) SpO2:  [93 %-100 %] 96 % (04/10 0503) Weight:  [187 lb 9.8 oz (85.1 kg)] 187 lb 9.8 oz (85.1 kg) (04/09 1615) Last BM Date: 04/07/12  Intake/Output from previous day: 04/09 0701 - 04/10 0700 In: 3571 [I.V.:3571] Out: 1390 [Urine:1365; Blood:25] Intake/Output this shift: Total I/O In: 1471 [I.V.:1471] Out: 350 [Urine:350]  General appearance: in no distress. Mental status good. Pulse slightly irregular, rate 95. Resp: clear to auscultation bilaterally GI: abdomen soft. Rare bowel sounds. Appropriate tenderness. Wounds and incisions clean and dry.  Lab Results:  Results for orders placed during the hospital encounter of 04/08/12 (from the past 24 hour(s))  APTT     Status: Normal   Collection Time   04/08/12  7:40 AM      Component Value Range   aPTT 34  24 - 37 (seconds)  PROTIME-INR     Status: Abnormal   Collection Time   04/08/12  7:40 AM      Component Value Range   Prothrombin Time 15.5 (*) 11.6 - 15.2 (seconds)   INR 1.20  0.00 - 1.49   TYPE AND SCREEN     Status: Normal   Collection Time   04/08/12  7:45 AM      Component Value Range   ABO/RH(D) B POS     Antibody Screen NEG     Sample Expiration 04/11/2012    ABO/RH     Status: Normal   Collection Time   04/08/12  7:45 AM      Component Value Range   ABO/RH(D) B POS       Studies/Results: @RISRSLT24 @     . alvimopan  12 mg Oral Once  . alvimopan  12 mg Oral BID  . amLODipine  2.5 mg Oral Daily  . antiseptic oral rinse  15 mL Mouth Rinse q12n4p  . chlorhexidine  15 mL Mouth Rinse BID  . diltiazem  120 mg Oral Daily  . ertapenem   1 g Intravenous 60 min Pre-Op  . ertapenem (INVANZ) IV  1 g Intravenous Q24H  . heparin  5,000 Units Subcutaneous Once  . heparin  5,000 Units Subcutaneous Q8H  . levothyroxine  25 mcg Oral Q breakfast     Assessment/Plan: s/p Procedure(s): LAPAROSCOPIC RIGHT COLECTOMY LAPAROSCOPIC LYSIS OF ADHESIONS LIVER BIOPSY  POD #1. Stable. Remove Foley today. Entereg protocol. Water and ice chips only due to the extent of lysis of adhesions, anticipate ileus. Mobilize out of bed. Check am labs.  Atrial fibrillation. Coumadin currently held, will resume prior to discharge.continue Cardizem. Check EKG this am.  Hypertension. Continue Norvasc. Hold Lasix.   Patient Active Hospital Problem List: No active hospital problems.   LOS: 1 day    Libi Corso M. Derrell Lolling, M.D., [facility] General and Minimally invasive Surgery Breast and Colorectal Surgery Office:   [phone] Pager:   [phone]  04/09/2012  . .prob

## 2012-04-10 LAB — CBC
HCT: 37.7 % (ref 36.0–46.0)
Hemoglobin: 12.4 g/dL (ref 12.0–15.0)
MCHC: 32.6 g/dL (ref 30.0–36.0)
MCHC: 33 g/dL (ref 30.0–36.0)
Platelets: 152 10*3/uL (ref 150–400)
RDW: 12.5 % (ref 11.5–15.5)
RDW: 12.5 % (ref 11.5–15.5)
WBC: 8.3 10*3/uL (ref 4.0–10.5)
WBC: 9.9 10*3/uL (ref 4.0–10.5)

## 2012-04-10 LAB — BASIC METABOLIC PANEL
BUN: 13 mg/dL (ref 6–23)
Chloride: 107 mEq/L (ref 96–112)
Chloride: 107 mEq/L (ref 96–112)
GFR calc Af Amer: >90 mL/min (ref >90)
GFR calc Af Amer: >90 mL/min (ref >90)
GFR calc non Af Amer: 87 mL/min — ABNORMAL LOW (ref >90)
GFR calc non Af Amer: >90 mL/min (ref >90)
Glucose, Bld: 77 mg/dL (ref 70–99)
Potassium: 3.8 mEq/L (ref 3.5–5.1)
Potassium: 3.7 mEq/L (ref 3.5–5.1)
Sodium: 139 mEq/L (ref 135–145)

## 2012-04-10 MED ORDER — FUROSEMIDE 20 MG PO TABS
20.0000 mg | ORAL_TABLET | Freq: Every day | ORAL | Status: DC
  Administered 2012-04-10 – 2012-04-13 (×4): 20 mg via ORAL
  Filled 2012-04-10 (×6): qty 1

## 2012-04-10 MED ORDER — POTASSIUM CHLORIDE CRYS ER 20 MEQ PO TBCR
20.0000 meq | EXTENDED_RELEASE_TABLET | Freq: Every day | ORAL | Status: DC
  Administered 2012-04-10 – 2012-04-13 (×4): 20 meq via ORAL
  Filled 2012-04-10 (×6): qty 1

## 2012-04-10 NOTE — Progress Notes (Addendum)
2 Days Post-Op  Subjective: Stable and alert. Mental status normal. Says she feels nauseated but has not vomited. No flatus or stool.Says she feels more fatigued today than yesterday. She has been able to ambulate in hall.  He has been unable to void and has required in and out catheterization at least twice. Urine output 800 cc per 24-hour period  Morning labs pending. Pathology pending.  EKG looks fine with normal sinus rhythm, occasional sinus arrhythmia.  Objective: Vital signs in last 24 hours: Temp:  [97.7 F (36.5 C)-99 F (37.2 C)] 97.7 F (36.5 C) (04/11 0539) Pulse Rate:  [78-94] 92  (04/11 0539) Resp:  [16-19] 18  (04/11 0539) BP: (115-137)/(54-62) 131/61 mmHg (04/11 0539) SpO2:  [87 %-98 %] 91 % (04/11 0539) Last BM Date: 04/07/12  Intake/Output from previous day: 04/10 0701 - 04/11 0700 In: 3445 [I.V.:3045; IV Piggyback:400] Out: 800 [Urine:800] Intake/Output this shift:    General appearance: alert. Mental status normal. In no distress. Resp: clear to auscultation bilaterally GI: soft. Hypoactive bowel sounds. Incisions clean and dry. Borderline distended.  Lab Results:  No results found for this or any previous visit (from the past 24 hour(s)).   Studies/Results: @RISRSLT24 @     . alvimopan  12 mg Oral BID  . amLODipine  2.5 mg Oral Daily  . antiseptic oral rinse  15 mL Mouth Rinse q12n4p  . chlorhexidine  15 mL Mouth Rinse BID  . diltiazem  120 mg Oral Daily  . ertapenem (INVANZ) IV  1 g Intravenous Q24H  . furosemide  20 mg Oral Daily  . heparin  5,000 Units Subcutaneous Q8H  . levothyroxine  25 mcg Oral Q breakfast  . potassium chloride  10 mEq Intravenous Q1 Hr x 4  . potassium chloride  20 mEq Oral Daily     Assessment/Plan: s/p Procedure(s): LAPAROSCOPIC RIGHT COLECTOMY LAPAROSCOPIC LYSIS OF ADHESIONS LIVER BIOPSY  POD #2. Stable. Continue ambulation all. N.p.o. Except sips and meds. Anticipate ileus due to the extent of lysis of  adhesions. Incentive spirometry Continue Entereg. Check pathology.  Urinary retention, may require Foley catheter. Restart Lasix 20 mg daily  Hypokalemia. Check labs now. K-Dur 20 milliequivalents daily to start today.  Atrial fibrillation. Coumadin currently held. Remains on Cardizem.  Hypertension. Continue Norvasc. Start Lasix.  LOS: 2 days    Hendry Speas M. Derrell Lolling, M.D., [facility] General and Minimally invasive Surgery Breast and Colorectal Surgery Office:   [phone] Pager:   [phone]  04/10/2012  . .prob

## 2012-04-11 MED ORDER — BISACODYL 10 MG RE SUPP
10.0000 mg | Freq: Two times a day (BID) | RECTAL | Status: DC
  Administered 2012-04-11 (×2): 10 mg via RECTAL
  Filled 2012-04-11 (×2): qty 1

## 2012-04-11 NOTE — Progress Notes (Addendum)
3 Days Post-Op  Subjective: She feels better. Nauseated  yesterday. Denies emesis. Not nauseated this morning. No flatus or stool yet. Feels a little distended. Was unable to void and Foley reinserted. Has been up to chair but not able tabulated hall yet.  Objective: Vital signs in last 24 hours: Temp:  [98.1 F (36.7 C)-98.5 F (36.9 C)] 98.5 F (36.9 C) (04/12 0616) Pulse Rate:  [80-91] 91  (04/12 0616) Resp:  [17-19] 17  (04/12 0616) BP: (121-137)/(61-68) 137/64 mmHg (04/12 0616) SpO2:  [92 %-94 %] 93 % (04/12 0616) Last BM Date: 04/07/12  Intake/Output from previous day: 04/11 0701 - 04/12 0700 In: 2865 [I.V.:2865] Out: 2075 [Urine:2075] Intake/Output this shift: Total I/O In: 1954 [I.V.:1954] Out: 1500 [Urine:1500]  General appearance: moderate distress and alert. Pleasant. Oriented. Mental status normal. In no apparent distress. Resp: clear to auscultation bilaterally GI:  abdomen is somewhat distended and tympanitic. Silent. Very soft. Nontender. Wounds clean.  Lab Results:  Results for orders placed during the hospital encounter of 04/08/12 (from the past 24 hour(s))  CBC     Status: Abnormal   Collection Time   04/10/12  9:08 AM      Component Value Range   WBC 9.9  4.0 - 10.5 (K/uL)   RBC 3.88  3.87 - 5.11 (MIL/uL)   Hemoglobin 12.4  12.0 - 15.0 (g/dL)   HCT 40.9  81.1 - 91.4 (%)   MCV 96.9  78.0 - 100.0 (fL)   MCH 32.0  26.0 - 34.0 (pg)   MCHC 33.0  30.0 - 36.0 (g/dL)   RDW 78.2  95.6 - 21.3 (%)   Platelets 137 (*) 150 - 400 (K/uL)  BASIC METABOLIC PANEL     Status: Abnormal   Collection Time   04/10/12  9:08 AM      Component Value Range   Sodium 139  135 - 145 (mEq/L)   Potassium 3.7  3.5 - 5.1 (mEq/L)   Chloride 107  96 - 112 (mEq/L)   CO2 20  19 - 32 (mEq/L)   Glucose, Bld 77  70 - 99 (mg/dL)   BUN 13  6 - 23 (mg/dL)   Creatinine, Ser 0.86 (*) 0.50 - 1.10 (mg/dL)   Calcium 9.1  8.4 - 57.8 (mg/dL)   GFR calc non Af Amer >90  >90 (mL/min)   GFR calc  Af Amer >90  >90 (mL/min)     Studies/Results: @RISRSLT24 @     . alvimopan  12 mg Oral BID  . amLODipine  2.5 mg Oral Daily  . antiseptic oral rinse  15 mL Mouth Rinse q12n4p  . chlorhexidine  15 mL Mouth Rinse BID  . diltiazem  120 mg Oral Daily  . furosemide  20 mg Oral Daily  . heparin  5,000 Units Subcutaneous Q8H  . levothyroxine  25 mcg Oral Q breakfast  . potassium chloride  20 mEq Oral Daily     Assessment/Plan: s/p Procedure(s): LAPAROSCOPIC RIGHT COLECTOMY LAPAROSCOPIC LYSIS OF ADHESIONS LIVER BIOPSY  POD #3. Stable. Ileus persists. Not surprising considering extent of lysis of adhesions. Continue n.p.o. Except meds and sips. Continue entereg ambulate Check pathology, not available yet.  Urinary retention. Foley catheter reinserted. Discontinue Foley once more ambullatory.  Hypokalemia. Better yesterday. Check labs Saturday. On K- Dur.  Atrial fibrillation. On Cardizem. Restart Coumadin once able to tolerate diet.  Hypertension. Continue Norvasc and Lasix.  LOS: 3 days    Indi Willhite M. Derrell Lolling, M.D., [facility] General  and Minimally invasive Surgery Breast and Colorectal Surgery Office:   [phone] Pager:   [phone]  04/11/2012  . .prob

## 2012-04-12 LAB — BASIC METABOLIC PANEL
CO2: 22 mEq/L (ref 19–32)
Glucose, Bld: 76 mg/dL (ref 70–99)
Potassium: 3.9 mEq/L (ref 3.5–5.1)
Sodium: 142 mEq/L (ref 135–145)

## 2012-04-12 LAB — CBC
HCT: 36 % (ref 36.0–46.0)
Hemoglobin: 11.8 g/dL — ABNORMAL LOW (ref 12.0–15.0)
RBC: 3.77 MIL/uL — ABNORMAL LOW (ref 3.87–5.11)

## 2012-04-12 NOTE — Progress Notes (Signed)
Orthopedic Tech Progress Note Patient Details:  Trishelle Devora [facility] [birth year] [id number]  Patient ID: Nydia Bouton, female   DOB: 1929/05/14, 78 y.o.   MRN: [id number] Delivered abdominal binder.   Jayvan Mcshan T 04/12/2012, 10:21 AM

## 2012-04-12 NOTE — Progress Notes (Signed)
4 Days Post-Op  Subjective: Patient reports two bowel movements last night; no nausea  Objective: Vital signs in last 24 hours: Temp:  [97.6 F (36.4 C)-98.5 F (36.9 C)] 97.6 F (36.4 C) (04/13 0600) Pulse Rate:  [81-94] 81  (04/13 0600) Resp:  [16-19] 19  (04/13 0600) BP: (128-135)/(55-67) 134/55 mmHg (04/13 0600) SpO2:  [93 %-96 %] 93 % (04/13 0600) Last BM Date: 04/07/12  Intake/Output from previous day: 04/12 0701 - 04/13 0700 In: 2624 [I.V.:2624] Out: 3350 [Urine:3350] Intake/Output this shift:    General appearance: alert, cooperative and no distress GI: soft; incisional tenderness; active bowel sounds Midline incision with some bruising, but no sign of infection; port sites c/d/i  Lab Results:   Basename 04/12/12 0810 04/10/12 0908  WBC 6.7 9.9  HGB 11.8* 12.4  HCT 36.0 37.6  PLT 174 137*   BMET  Basename 04/12/12 0810 04/10/12 0908  NA 142 139  K 3.9 3.7  CL 108 107  CO2 22 20  GLUCOSE 76 77  BUN 11 13  CREATININE 0.45* 0.47*  CALCIUM 9.3 9.1   PT/INR No results found for this basename: LABPROT:2,INR:2 in the last 72 hours ABG No results found for this basename: PHART:2,PCO2:2,PO2:2,HCO3:2 in the last 72 hours  Studies/Results: No results found.  Anti-infectives: Anti-infectives     Start     Dose/Rate Route Frequency Ordered Stop   04/08/12 1730   ertapenem (INVANZ) 1 g in sodium chloride 0.9 % 50 mL IVPB        1 g 100 mL/hr over 30 Minutes Intravenous Every 24 hours 04/08/12 1532 04/09/12 1729   04/07/12 1410   ertapenem (INVANZ) 1 g in sodium chloride 0.9 % 50 mL IVPB        1 g 100 mL/hr over 30 Minutes Intravenous 60 min pre-op 04/07/12 1410 04/08/12 1020          Assessment/Plan: s/p Procedure(s) (LRB): LAPAROSCOPIC RIGHT COLECTOMY (Right) LAPAROSCOPIC LYSIS OF ADHESIONS (N/A) LIVER BIOPSY () Clear liquids, decrease IVF, abdominal binder  LOS: 4 days    Darnisha Vernet K. 04/12/2012

## 2012-04-12 NOTE — Progress Notes (Signed)
Orthopedic Tech Progress Note Patient Details:  Sheriece Jefcoat [facility] 13-Feb-1929 [id number]  Other Ortho Devices Type of Ortho Device: Other (comment)   Shanieka Blea T 04/12/2012, 10:20 AM

## 2012-04-13 MED ORDER — WARFARIN - PHARMACIST DOSING INPATIENT: Freq: Every day | Status: DC

## 2012-04-13 MED ORDER — WARFARIN SODIUM 5 MG PO TABS
5.0000 mg | ORAL_TABLET | ORAL | Status: AC
  Administered 2012-04-13: 5 mg via ORAL
  Filled 2012-04-13: qty 1

## 2012-04-13 NOTE — Progress Notes (Signed)
C/o palpitations and feeling weak, VS 120/60, PR 105-116, RR18 , O2 sat 94 on RA.  12 EKG done, no new order at this time

## 2012-04-13 NOTE — Progress Notes (Signed)
5 Days Post-Op  Subjective: Patient reports a few more bowel movements yesterday.  Mild nausea.  Tolerating clear liquids - states that "taste buds are off"  Minimal pain medication use   Objective: Vital signs in last 24 hours: Temp:  [98.2 F (36.8 C)-98.4 F (36.9 C)] 98.2 F (36.8 C) (04/14 0533) Pulse Rate:  [80-96] 80  (04/14 0533) Resp:  [18] 18  (04/14 0533) BP: (126-139)/(63-70) 126/68 mmHg (04/14 0533) SpO2:  [90 %-95 %] 92 % (04/14 0533) Last BM Date: 04/12/12  Intake/Output from previous day: 04/13 0701 - 04/14 0700 In: 1728.3 [P.O.:240; I.V.:1488.3] Out: 2550 [Urine:2550] Intake/Output this shift:    General appearance: alert, cooperative and no distress GI: soft, minimally distended; midline incisional tenderness only Incision - with some bruising; port sites OK  Lab Results:   Basename 04/12/12 0810  WBC 6.7  HGB 11.8*  HCT 36.0  PLT 174   BMET  Basename 04/12/12 0810  NA 142  K 3.9  CL 108  CO2 22  GLUCOSE 76  BUN 11  CREATININE 0.45*  CALCIUM 9.3   PT/INR No results found for this basename: LABPROT:2,INR:2 in the last 72 hours ABG No results found for this basename: PHART:2,PCO2:2,PO2:2,HCO3:2 in the last 72 hours  Studies/Results: No results found.  Anti-infectives: Anti-infectives     Start     Dose/Rate Route Frequency Ordered Stop   04/08/12 1730   ertapenem (INVANZ) 1 g in sodium chloride 0.9 % 50 mL IVPB        1 g 100 mL/hr over 30 Minutes Intravenous Every 24 hours 04/08/12 1532 04/09/12 1729   04/07/12 1410   ertapenem (INVANZ) 1 g in sodium chloride 0.9 % 50 mL IVPB        1 g 100 mL/hr over 30 Minutes Intravenous 60 min pre-op 04/07/12 1410 04/08/12 1020          Assessment/Plan: s/p Procedure(s) (LRB): LAPAROSCOPIC RIGHT COLECTOMY (Right) LAPAROSCOPIC LYSIS OF ADHESIONS (N/A) LIVER BIOPSY () Advance diet to regular diet; saline lock Possible discharge tomorrow.   LOS: 5 days    Keron Neenan  K. 04/13/2012

## 2012-04-13 NOTE — Progress Notes (Signed)
ANTICOAGULATION CONSULT NOTE - Initial Consult  Pharmacy Consult for Coumadin Indication: History of atrial fibrillation  Allergies  Allergen Reactions  . Adhesive (Tape) Rash  . Latex Rash    Per Patient Latex=lip swelling    Patient Measurements: Height: 5\' 3"  (160 cm) Weight: 187 lb 9.8 oz (85.1 kg) IBW/kg (Calculated) : 52.4    Vital Signs: Temp: 98.2 F (36.8 C) (04/14 0533) Temp src: Oral (04/14 0533) BP: 126/68 mmHg (04/14 0533) Pulse Rate: 80  (04/14 0533)  Labs:  [facility] 04/12/12 0810  HGB 11.8*  HCT 36.0  PLT 174  APTT --  LABPROT --  INR --  HEPARINUNFRC --  CREATININE 0.45*  CKTOTAL --  CKMB --  TROPONINI --   Estimated Creatinine Clearance: 58 ml/min (by C-G formula based on Cr of 0.45).  Medical History: Past Medical History  Diagnosis Date  . HTN (hypertension)   . Environmental allergies   . Arthritis   . Thyroid disease   . History of colon cancer   . Hemochromatosis 1998  . Atrial fibrillation   . Constipation   . Arthritis pain   . Cancer   . PONV (postoperative nausea and vomiting)   . Varicose vein of leg     Medications:  Prescriptions prior to admission  Medication Sig Dispense Refill  . acetaminophen (TYLENOL) 500 MG tablet Take 500 mg by mouth every 6 (six) hours as needed. For pain      . amLODipine (NORVASC) 2.5 MG tablet Take 2.5 mg by mouth daily.       Marland Kitchen atorvastatin (LIPITOR) 10 MG tablet Take 10 mg by mouth Daily.       Marland Kitchen diltiazem (CARDIZEM CD) 120 MG 24 hr capsule Take 120 mg by mouth daily.      . furosemide (LASIX) 20 MG tablet Take 20 mg by mouth Daily.       Marland Kitchen levothyroxine (SYNTHROID, LEVOTHROID) 50 MCG tablet Take 25 mcg by mouth Daily.       . meclizine (ANTIVERT) 25 MG tablet Take 25 mg by mouth 3 (three) times daily as needed.      . Misc Natural Products (OSTEO BI-FLEX JOINT SHIELD PO) Take 1 tablet by mouth 2 (two) times daily.       . Omega-3 Fatty Acids (FISH OIL) 1200 MG CAPS Take 1 capsule by mouth  daily.       . polyethylene glycol (MIRALAX / GLYCOLAX) packet Take 17 g by mouth daily as needed. For constipation      . senna (SENOKOT) 8.6 MG TABS Take 1 tablet by mouth daily as needed. For constipation      . vitamin B-12 (CYANOCOBALAMIN) 100 MCG tablet Take 50 mcg by mouth daily.        . vitamin E 400 UNIT capsule Take 400 Units by mouth daily.        Marland Kitchen warfarin (COUMADIN) 3 MG tablet Take 1.5-3 mg by mouth See admin instructions. 3 mg on all days except Fridays and Takes 1.5 mg on fridays only.       Scheduled:    . amLODipine  2.5 mg Oral Daily  . antiseptic oral rinse  15 mL Mouth Rinse q12n4p  . chlorhexidine  15 mL Mouth Rinse BID  . diltiazem  120 mg Oral Daily  . furosemide  20 mg Oral Daily  . heparin  5,000 Units Subcutaneous Q8H  . levothyroxine  25 mcg Oral Q breakfast  . potassium chloride  20 mEq Oral  Daily  . DISCONTD: alvimopan  12 mg Oral BID  . DISCONTD: bisacodyl  10 mg Rectal BID    Assessment: 78 y.o. Female on coumadin PTA for h/o Afib. Home dose was 3mg  daily except 1.5mg  every Friday. Last dose taken on 04/01/12 as coumadin held for surgery.  Coumadin to be resumed today. POD# 5 s/p colectomy 2/2 villous adenoma of the cecum. INR = 1.2 on 04/08/12.  H/H 11.8/36, pltc 174K. Currently receiving SQ heparin 5000 units q8h for VTE prophylaxis.  Outpatient coumadin is managed by her cardiologist, Dr. Sanjuana Kava. MD notes possible discharge tomorrow  Goal of Therapy:  INR 2-3   Plan:  Give Coumadin 5mg  po today x1. Will give dose this AM, thus no dose at 6pm tonight. Monitor INR Wilfred Curtis, Kristine Royal, RPh 04/13/2012,10:40 AM

## 2012-04-14 ENCOUNTER — Telehealth (INDEPENDENT_AMBULATORY_CARE_PROVIDER_SITE_OTHER): Payer: Self-pay

## 2012-04-14 MED ORDER — HYDROCODONE-ACETAMINOPHEN 5-325 MG PO TABS: 1.0000 | ORAL_TABLET | ORAL | Status: AC | PRN

## 2012-04-14 NOTE — Discharge Summary (Signed)
Patient ID: [name] [id number] 78 y.o. December 17, 1929  04/08/2012  Discharge date and time: April 14, 2012  Admitting Physician: Ernestene Mention  Discharge Physician: Ernestene Mention  Admission Diagnoses: colon mass  Discharge Diagnoses: 4 cm tubular adenoma of cecum Benign cyst, left lobe of liver Extensive intra-abdominal adhesions Intermittent atrial fibrillation Hemochromatosis Remote history sigmoid colectomy for stage II, T3 N0 cancer sigmoid colon, no known recurrence Remote history laparoscopic cholecystectomy and umbilical hernia repair  Operations: Procedure(s): LAPAROSCOPIC RIGHT COLECTOMY LAPAROSCOPIC LYSIS OF ADHESIONS LIVER BIOPSY  Admission Condition: good  Discharged Condition: good  Indication for Admission:  [name] is a 78 y.o. female. This patient was referred by Dr. Carman Ching for surgical management of an unresectable villous adenoma of the cecum. Dr. Burton Apley is her primary care physician, and Dr. Melene Muller and it is her cardiologist who manages her Coumadin.  The patient is basically asymptomatic from a GI standpoint. She has chronic constipation and takes MiraLax almost daily. Her weight has been stable and her appetite is good. She has a significant past history of a sigmoid colectomy on October 29, 1995 for a stage II, T3, N0 colon cancer. She was followed by Dr. Truett Perna for many years, treated with observation only, no known recurrence to date. On January 12, 2004 she underwent laparoscopic cholecystectomy and repair of umbilical hernia by Dr. Kendrick Ranch. He described adhesions under the umbilicus which did take some time to get down but he was able to do this laparoscopically. She has hemochromatosis. She had An episode of atrial fibrillation but thinks that she's been in sinus rhythm for some time.   I evaluated her as an outpatient. She was counseled regarding resection of what is thought to be a premalignant adenoma of  her cecum. She underwent bowel prep at home. She was brought to the hospital electively.  Hospital Course: on the day of admission the patient was taken to the operating room. Laparoscopic lysis of adhesions required one hour as adhesions were very significant. We were able to perform a laparoscopic right colectomy. We saw a whitish wrinkled lesion in the left lobe of the liver which was thought to be a benign cyst. Because of her history of colon cancer we biopsied this as well. All the surgery was accomplished laparoscopically.  Final pathology report showed a 4 cm tubular adenoma of the cecum. This was benign. 19 lymph nodes were evaluated. They were all benign. The biopsy level of the liver so the hilum I cyst which was benign. The pathology report was discussed with the patient.  Postoperatively the patient did reasonably well. Genitalia is for a few days. She was maintained on entereg. She had a couple episodes of tachycardia but EKG simply showed sinus tachycardia with occasional ectopy. She never went into atrial fibrillation.  As her ileus resolved she began passing flatus and having stools. Her diet was advanced to a regular diet.  On April 15 she felt well and was ready to go home. She tolerated a regular diet. Pharmacy had restarted her Coumadin. Her heart rate was regular and no more than 80. She had no shortness of breath or chest pain. She was asking to go home. Her wound looked good and her abdomen was soft.  She was advised to go to the [facility] this coming Friday, 4 days from now to check her Coumadin level. She will continue all of her usual medications. She was given a prescription for Vicodin for pain.  She was advised to return to my office in 4 days, this coming Friday for staple removal.  She was given advice and diet and activities.  Consults: None  Significant Diagnostic Studies: pathology  Treatments: surgery: laparoscopic lysis of adhesions,  laparoscopic right colectomy, laparoscopic liver biopsy.  Disposition: Home  Patient Instructions:   [name]  Home Medication Instructions [id number]   Printed on:04/14/12 0707  Medication Information                    furosemide (LASIX) 20 MG tablet Take 20 mg by mouth Daily.            levothyroxine (SYNTHROID, LEVOTHROID) 50 MCG tablet Take 25 mcg by mouth Daily.            atorvastatin (LIPITOR) 10 MG tablet Take 10 mg by mouth Daily.            vitamin B-12 (CYANOCOBALAMIN) 100 MCG tablet Take 50 mcg by mouth daily.             Misc Natural Products (OSTEO BI-FLEX JOINT SHIELD PO) Take 1 tablet by mouth 2 (two) times daily.            Omega-3 Fatty Acids (FISH OIL) 1200 MG CAPS Take 1 capsule by mouth daily.            vitamin E 400 UNIT capsule Take 400 Units by mouth daily.             senna (SENOKOT) 8.6 MG TABS Take 1 tablet by mouth daily as needed. For constipation           polyethylene glycol (MIRALAX / GLYCOLAX) packet Take 17 g by mouth daily as needed. For constipation           amLODipine (NORVASC) 2.5 MG tablet Take 2.5 mg by mouth daily.            acetaminophen (TYLENOL) 500 MG tablet Take 500 mg by mouth every 6 (six) hours as needed. For pain           warfarin (COUMADIN) 3 MG tablet Take 1.5-3 mg by mouth See admin instructions. 3 mg on all days except Fridays and Takes 1.5 mg on fridays only.           diltiazem (CARDIZEM CD) 120 MG 24 hr capsule Take 120 mg by mouth daily.           meclizine (ANTIVERT) 25 MG tablet Take 25 mg by mouth 3 (three) times daily as needed.             Activity: no sports or heavy lifting for 4-5 weeks. No driving for 2 weeks. Okay to shower. Diet: cardiac diet Wound Care: none needed  Follow-up:  With Dr. Derrell Lolling in 4 days.  Signed: Angelia Mould. Derrell Lolling, M.D., FACS General and minimally invasive surgery Breast and Colorectal Surgery  04/14/2012, 7:07 AM

## 2012-04-14 NOTE — Discharge Planning (Signed)
Patient discharged home in stable condition. Verbalizes understanding of all discharge instructions, including home medications and follow up appointments. 

## 2012-04-14 NOTE — Telephone Encounter (Signed)
Pt home doing well. Po appt, coumadin clinic appt and staple rem appt given to pt's daughter.

## 2012-04-14 NOTE — Progress Notes (Signed)
6 Days Post-Op  Subjective: Feels fine. Ambulatory. Tolerating regular diet. Several bowel movements. Comfortable. Asking to go home.  Had a very brief episode of palpitations yesterday. EKG showed sinus tachycardia at 110. This resolved quickly. There was no chest pain and no shortness of breath.  She is on her Norvasc and Cardizem and Lasix and Synthroid. Coumadin has been restarted.  Pathology report shows a 4 cm tubular adenoma, 16 lymph nodes negative, liver biopsy shows benign hyalinized cyst. No evidence of malignancy. Pathology report was discussed with the patient.  Objective: Vital signs in last 24 hours: Temp:  [97.5 F (36.4 C)-98.1 F (36.7 C)] 98.1 F (36.7 C) (04/15 0543) Pulse Rate:  [75-106] 75  (04/15 0543) Resp:  [18] 18  (04/15 0543) BP: (117-128)/(51-63) 128/63 mmHg (04/15 0543) SpO2:  [92 %-95 %] 92 % (04/15 0543) Last BM Date: 04/12/12  Intake/Output from previous day: 04/14 0701 - 04/15 0700 In: 240 [P.O.:240] Out: 300 [Urine:300] Intake/Output this shift:    General appearance: patient looks good. Good spirits. No  distress. Heart rate 80, regular, no ectopy. Resp: clear to auscultation bilaterally GI: abdomen soft. Incision looks good. Nondistended. Active bowel sounds. Minimal tenderness.  Lab Results:  No results found for this or any previous visit (from the past 24 hour(s)).   Studies/Results: @RISRSLT24 @     . amLODipine  2.5 mg Oral Daily  . diltiazem  120 mg Oral Daily  . furosemide  20 mg Oral Daily  . heparin  5,000 Units Subcutaneous Q8H  . levothyroxine  25 mcg Oral Q breakfast  . potassium chloride  20 mEq Oral Daily  . warfarin  5 mg Oral NOW  . Warfarin - Pharmacist Dosing Inpatient   Does not apply q1800  . DISCONTD: alvimopan  12 mg Oral BID  . DISCONTD: antiseptic oral rinse  15 mL Mouth Rinse q12n4p  . DISCONTD: bisacodyl  10 mg Rectal BID  . DISCONTD: chlorhexidine  15 mL Mouth Rinse BID     Assessment/Plan: s/p  Procedure(s): LAPAROSCOPIC RIGHT COLECTOMY LAPAROSCOPIC LYSIS OF ADHESIONS LIVER BIOPSY  4 cm tubular adenoma of cecum and benign cyst left lobe of liver. Recovering uneventfully following laparoscopic lysis of adhesions, laparoscopic right colectomy and laparoscopic liver biopsy.  She is ready for discharge home today, and she would like to go home.  Past history stage II cancer sigmoid colon, no known recurrence.  Chronic Coumadin therapy for intermittent atrial fibrillation. Currently in sinus rhythm.  Hemochromatosis.  Will discharge home today. Return to my office in 4 days, this coming Friday for staple removal. She is advised to go to the left lower Coumadin clinic this Friday for a Coumadin check.  She will stay on her regular Coumadin dose which is 3 mg every day except for Friday, when she takes 1.5 mg. She is well aware of  all her medications.  Prescription for Vicodin given.       LOS: 6 days    Jonanthony Nahar M. Derrell Lolling, M.D., [facility] General and Minimally invasive Surgery Breast and Colorectal Surgery Office:   [phone] Pager:   [phone]  04/14/2012  . .prob

## 2012-04-18 ENCOUNTER — Ambulatory Visit (INDEPENDENT_AMBULATORY_CARE_PROVIDER_SITE_OTHER): Payer: Medicare Other

## 2012-04-18 VITALS — BP 140/82 | HR 68 | Temp 97.1°F | Resp 18 | Wt 181.2 lb

## 2012-04-18 DIAGNOSIS — Z4802 Encounter for removal of sutures: Secondary | ICD-10-CM

## 2012-04-18 NOTE — Progress Notes (Signed)
Pt in for staple removal. Wound clean,dry, healing well. Staples removed. Steri  Strips applied. Pt to keep po appt with MD and call with any concerns.

## 2012-04-22 ENCOUNTER — Ambulatory Visit (INDEPENDENT_AMBULATORY_CARE_PROVIDER_SITE_OTHER): Payer: Medicare Other | Admitting: *Deleted

## 2012-04-22 DIAGNOSIS — Z7901 Long term (current) use of anticoagulants: Secondary | ICD-10-CM

## 2012-04-22 DIAGNOSIS — I4891 Unspecified atrial fibrillation: Secondary | ICD-10-CM

## 2012-04-24 ENCOUNTER — Encounter (INDEPENDENT_AMBULATORY_CARE_PROVIDER_SITE_OTHER): Payer: Self-pay | Admitting: General Surgery

## 2012-04-24 ENCOUNTER — Ambulatory Visit (INDEPENDENT_AMBULATORY_CARE_PROVIDER_SITE_OTHER): Payer: Medicare Other | Admitting: General Surgery

## 2012-04-24 VITALS — BP 142/80 | HR 84 | Temp 97.2°F | Resp 18 | Ht 63.0 in | Wt 181.6 lb

## 2012-04-24 DIAGNOSIS — D122 Benign neoplasm of ascending colon: Secondary | ICD-10-CM

## 2012-04-24 DIAGNOSIS — D126 Benign neoplasm of colon, unspecified: Secondary | ICD-10-CM

## 2012-04-24 NOTE — Patient Instructions (Signed)
You are recovering without any obvious complication following your colon surgery.  Forconstipation, I recommend that you drink 5-6 glasses of water a day, and take 2 doses of MiraLax twice a day.  You may walk around the block. You may start driving a car. No sports or heavy lifting for 5 weeks.  Return to see Dr. Derrell Lolling in 5 weeks.

## 2012-04-24 NOTE — Progress Notes (Signed)
Subjective:     Patient ID: [name], female   DOB: 04-10-1929, 78 y.o.   MRN: [id number]  HPI This patient underwent laparoscopic lysis of adhesions lasting 60 minutes, laparoscopic right colectomy, and laparoscopic biopsy of the liver on April 01, 2011. She has a past history of sigmoid colectomy for colon cancer.  Final pathology report shows a 4 cm tubular adenoma, negative nodes, and a biopsy of the left lobe of liver shows a degenerating hyalinized benign cyst.  She has done fairly well. She feels good. No pain cramps or nausea. Tolerating diet. She has begun to get constipated and hasn't had a bowel movement for 2 days.  This has been a chronic problem.   Review of Systems     Objective:   Physical Exam Patient looks well. In good spirits.  Abdomen soft. Flat. Nontender. Nondistended. No wound problems.    Assessment:     Tubular adenoma of ascending colon.  Benign degenerating hyalinized cyst of left lobe of liver.  Recovering uneventfully in the early postoperative following laparoscopic lysis of adhesions, laparoscopic right colectomy, and laparoscopic biopsy left lobe of liver.  Remote history of sigmoid colectomy for colon cancer, no known recurrence to date  Atrial fibrillation, chronic Coumadin therapy.  Hypertension.     Plan:     MiraLax, 2 doses b.i.d.  Emphasized hydration  Diet and activities discussed  Return to see me in 5 weeks.  Consider followup colonoscopy in one year.   Angelia Mould. Derrell Lolling, M.D., [facility] General and Minimally invasive Surgery Breast and Colorectal Surgery Office:   [phone] Pager:   [phone]

## 2012-04-28 ENCOUNTER — Telehealth (INDEPENDENT_AMBULATORY_CARE_PROVIDER_SITE_OTHER): Payer: Self-pay | Admitting: General Surgery

## 2012-04-28 ENCOUNTER — Telehealth (INDEPENDENT_AMBULATORY_CARE_PROVIDER_SITE_OTHER): Payer: Self-pay

## 2012-04-28 NOTE — Telephone Encounter (Signed)
Patient status post right colectomy on 04/08/12 calling today unable to move bowels. Patient states her last BM was around 04/15/12. She has been taking a double dose of miralax everyday and drinking a lot of water with no relief. She is passing gas occasionally. She tried a suppository last night but just got "liquid color." Patient is asking for advise because she states she is starting to get weak. She has been eating soups and moving around as much as she can. Please advise.

## 2012-04-28 NOTE — Telephone Encounter (Signed)
Per Dr Jacinto Halim order one gallon golytley called to [facility] [place]. [phone]. Pt notified of recommendation to begin golytely when she picks it up today and to follow instructions as written. Pt to call if this does not resolve constipation or if she has other concerns.

## 2012-04-29 ENCOUNTER — Telehealth (INDEPENDENT_AMBULATORY_CARE_PROVIDER_SITE_OTHER): Payer: Self-pay | Admitting: General Surgery

## 2012-04-29 ENCOUNTER — Emergency Department (HOSPITAL_COMMUNITY)
Admission: EM | Admit: 2012-04-29 | Discharge: 2012-04-29 | Disposition: A | Discharge status: Home / Self Care | Payer: Medicare Other | Attending: Emergency Medicine | Admitting: Emergency Medicine

## 2012-04-29 ENCOUNTER — Emergency Department (HOSPITAL_COMMUNITY): Payer: Medicare Other

## 2012-04-29 DIAGNOSIS — Z85038 Personal history of other malignant neoplasm of large intestine: Secondary | ICD-10-CM | POA: Insufficient documentation

## 2012-04-29 DIAGNOSIS — E079 Disorder of thyroid, unspecified: Secondary | ICD-10-CM | POA: Insufficient documentation

## 2012-04-29 DIAGNOSIS — Z7901 Long term (current) use of anticoagulants: Secondary | ICD-10-CM | POA: Insufficient documentation

## 2012-04-29 DIAGNOSIS — I4891 Unspecified atrial fibrillation: Secondary | ICD-10-CM | POA: Insufficient documentation

## 2012-04-29 DIAGNOSIS — I10 Essential (primary) hypertension: Secondary | ICD-10-CM | POA: Insufficient documentation

## 2012-04-29 DIAGNOSIS — K5641 Fecal impaction: Secondary | ICD-10-CM

## 2012-04-29 DIAGNOSIS — Z79899 Other long term (current) drug therapy: Secondary | ICD-10-CM | POA: Insufficient documentation

## 2012-04-29 DIAGNOSIS — M129 Arthropathy, unspecified: Secondary | ICD-10-CM | POA: Insufficient documentation

## 2012-04-29 LAB — DIFFERENTIAL
Eosinophils Relative: 1 % (ref 0–5)
Lymphocytes Relative: 29 % (ref 12–46)
Lymphs Abs: 1.5 10*3/uL (ref 0.7–4.0)
Neutro Abs: 3.2 10*3/uL (ref 1.7–7.7)
Neutrophils Relative %: 61 % (ref 43–77)

## 2012-04-29 LAB — COMPREHENSIVE METABOLIC PANEL
ALT: 16 U/L (ref 0–35)
Alkaline Phosphatase: 97 U/L (ref 39–117)
CO2: 26 mEq/L (ref 19–32)
Chloride: 99 mEq/L (ref 96–112)
GFR calc Af Amer: >90 mL/min (ref >90)
GFR calc non Af Amer: 85 mL/min — ABNORMAL LOW (ref >90)
Glucose, Bld: 101 mg/dL — ABNORMAL HIGH (ref 70–99)
Potassium: 3.8 mEq/L (ref 3.5–5.1)
Sodium: 139 mEq/L (ref 135–145)
Total Bilirubin: 1.3 mg/dL — ABNORMAL HIGH (ref 0.3–1.2)

## 2012-04-29 LAB — CBC
MCV: 96.2 fL (ref 78.0–100.0)
Platelets: 256 10*3/uL (ref 150–400)
RBC: 4.47 MIL/uL (ref 3.87–5.11)
WBC: 5.2 10*3/uL (ref 4.0–10.5)

## 2012-04-29 MED ORDER — LORAZEPAM 2 MG/ML IJ SOLN
1.0000 mg | Freq: Once | INTRAMUSCULAR | Status: AC
  Administered 2012-04-29: 1 mg via INTRAVENOUS

## 2012-04-29 MED ORDER — SODIUM CHLORIDE 0.9 % IV SOLN
Freq: Once | INTRAVENOUS | Status: AC
  Administered 2012-04-29: 18:00:00 via INTRAVENOUS

## 2012-04-29 MED ORDER — LORAZEPAM 2 MG/ML IJ SOLN
1.0000 mg | Freq: Once | INTRAMUSCULAR | Status: AC
  Administered 2012-04-29: 1 mg via INTRAVENOUS
  Filled 2012-04-29: qty 1

## 2012-04-29 MED ORDER — DIPHENHYDRAMINE HCL 50 MG/ML IJ SOLN
INTRAMUSCULAR | Status: AC
  Filled 2012-04-29: qty 1

## 2012-04-29 MED ORDER — SODIUM CHLORIDE 0.9 % IV BOLUS (SEPSIS)
1000.0000 mL | Freq: Once | INTRAVENOUS | Status: AC
  Administered 2012-04-29: 1000 mL via INTRAVENOUS

## 2012-04-29 MED ORDER — DIPHENHYDRAMINE HCL 50 MG/ML IJ SOLN
25.0000 mg | Freq: Once | INTRAMUSCULAR | Status: AC
  Administered 2012-04-29: 25 mg via INTRAVENOUS

## 2012-04-29 NOTE — ED Notes (Signed)
Pt. Has a portable commode at the bedside.

## 2012-04-29 NOTE — ED Provider Notes (Signed)
History     CSN: [id number]  Arrival date & time 04/29/12  1611   First MD Initiated Contact with Patient 04/29/12 1725      Chief Complaint  Patient presents with  . Fecal Impaction    sent by [facility]    (Consider location/radiation/quality/duration/timing/severity/associated sxs/prior treatment) The history is provided by the patient and a relative.   patient here with constipation x10 days. She saw her surgeon has been prescribed multiple medications without relief. No nausea vomiting. No fever. Recent history of abdominal surgery. When she did have a stool 10 days ago, it was hard. She feels weak and dehydrated. No bright red blood per rectum.  Past Medical History  Diagnosis Date  . HTN (hypertension)   . Environmental allergies   . Arthritis   . Thyroid disease   . History of colon cancer   . Hemochromatosis 1998  . Atrial fibrillation   . Constipation   . Arthritis pain   . Cancer   . PONV (postoperative nausea and vomiting)   . Varicose vein of leg     Past Surgical History  Procedure Date  . Colon surgery 1996    CANCER REMOVAL  . Total hip arthroplasty 2004    RIGHT  . Gallbladder surgery 2005  . Breast surgery 1991    Rt br lump removed-benign  . Liver biopsy 04/08/2012    Procedure: LIVER BIOPSY;  Surgeon: Ernestene Mention, MD;  Location: [facility] OR;  Service: General;;  laparoscopic    Family History  Problem Relation Age of Onset  . Heart disease Mother   . Stroke Mother   . Heart disease Father   . Pneumonia Brother     History  Substance Use Topics  . Smoking status: Never Smoker   . Smokeless tobacco: Not on file  . Alcohol Use: No    OB History    Grav Para Term Preterm Abortions TAB SAB Ect Mult Living                  Review of Systems  All other systems reviewed and are negative.    Allergies  Adhesive and Latex  Home Medications   Current Outpatient Rx  Name Route Sig Dispense Refill  . ACETAMINOPHEN 500 MG PO TABS Oral Take  500 mg by mouth every 6 (six) hours as needed. For pain    . AMLODIPINE BESYLATE 2.5 MG PO TABS Oral Take 2.5 mg by mouth daily.     . ATORVASTATIN CALCIUM 10 MG PO TABS Oral Take 10 mg by mouth Daily.     Marland Kitchen DILTIAZEM HCL ER COATED BEADS 120 MG PO CP24 Oral Take 120 mg by mouth daily.    . FUROSEMIDE 20 MG PO TABS Oral Take 20 mg by mouth Daily.     Marland Kitchen LEVOTHYROXINE SODIUM 50 MCG PO TABS Oral Take 25 mcg by mouth Daily.     Lanetta Inch BI-FLEX JOINT SHIELD PO Oral Take 1 tablet by mouth 2 (two) times daily.     Marland Kitchen FISH OIL 1200 MG PO CAPS Oral Take 1 capsule by mouth daily.     Marland Kitchen POLYETHYLENE GLYCOL 3350 PO PACK Oral Take 17 g by mouth daily as needed. For constipation    . SENNA 8.6 MG PO TABS Oral Take 1 tablet by mouth daily as needed. For constipation    . VITAMIN B-12 100 MCG PO TABS Oral Take 50 mcg by mouth daily.      Marland Kitchen VITAMIN  E 400 UNITS PO CAPS Oral Take 400 Units by mouth daily.      . WARFARIN SODIUM 3 MG PO TABS Oral Take 1.5-3 mg by mouth See admin instructions. 3 mg on all days except Fridays and Takes 1.5 mg on fridays only.      BP 143/65  Pulse 81  Temp(Src) 97.8 F (36.6 C) (Oral)  Resp 18  SpO2 94%  Physical Exam  Nursing note and vitals reviewed. Constitutional: She is oriented to person, place, and time. She appears well-developed and well-nourished.  Non-toxic appearance. No distress.  HENT:  Head: Normocephalic and atraumatic.  Eyes: Conjunctivae, EOM and lids are normal. Pupils are equal, round, and reactive to light.  Neck: Normal range of motion. Neck supple. No tracheal deviation present. No mass present.  Cardiovascular: Normal rate, regular rhythm and normal heart sounds.  Exam reveals no gallop.   No murmur heard. Pulmonary/Chest: Effort normal and breath sounds normal. No stridor. No respiratory distress. She has no decreased breath sounds. She has no wheezes. She has no rhonchi. She has no rales.  Abdominal: Soft. Normal appearance and bowel sounds are  normal. She exhibits no distension. There is no tenderness. There is no rebound and no CVA tenderness.  Genitourinary:       Hard stool in rectal vault  Musculoskeletal: Normal range of motion. She exhibits no edema and no tenderness.  Neurological: She is alert and oriented to person, place, and time. She has normal strength. No cranial nerve deficit or sensory deficit. GCS eye subscore is 4. GCS verbal subscore is 5. GCS motor subscore is 6.  Skin: Skin is warm and dry. No abrasion and no rash noted.  Psychiatric: She has a normal mood and affect. Her speech is normal and behavior is normal.    ED Course  Procedures (including critical care time)   Labs Reviewed  CBC  DIFFERENTIAL  COMPREHENSIVE METABOLIC PANEL   Dg Abd 2 Views  04/29/2012  *RADIOLOGY REPORT*  Clinical Data: 78 year old female with abdominal pain nausea constipation.  Recent abdominal surgery.  ABDOMEN - 2 VIEW  Comparison: Chest CT 08/29/2011.  Abdomen and pelvis CT 01/10/2004.  Findings: No pneumoperitoneum on the left side down lateral decubitus views.  Stable right upper quadrant surgical clips. Vascular calcifications most conspicuous at the splenic hilum. Chronic postoperative changes to the right hip. Nonobstructed bowel gas pattern.  Grossly negative lung bases.  Chronic degenerative changes in the lumbar spine.  Advanced left hip osteoarthritis. No acute osseous abnormality identified.  IMPRESSION: Nonobstructed bowel gas pattern, no free air.  Original Report Authenticated By: Harley Hallmark, M.D.     No diagnosis found.    MDM  Pt disimpacted and then given a fleets enema--good response with tx        Toy Baker, MD 04/29/12 2153

## 2012-04-29 NOTE — ED Notes (Signed)
Orders entered per Derrell Lolling, MD request when pt referred to ED.

## 2012-04-29 NOTE — Telephone Encounter (Signed)
Pt calling in to report no response to GoLytely.  Last BM 04/16/12.  She is not passing gas and is only dribbling from her rectum.  Paged Dr. Derrell Lolling and updated him; received orders to send to [facility] ER now.  Called orders to Triage RN for CBC, BMET and 2-way abdomen series: call report.  Pt understands to go to [facility] ER now and will comply.

## 2012-04-29 NOTE — ED Notes (Addendum)
Pt states she had surgery on 4/9 she had some of her colon taken out, a hernia remove, her appendix removed and a tumor off of her liver removed. She states she has been trying to use thebathroom since 4/17 and has not been able to. States she feels like she has a mass and has not been able to pass it. Took colon cleanse today and had liquid BM but nothing significant. CBG 111

## 2012-04-29 NOTE — ED Notes (Signed)
MWN:[id number]<ZD> Expected date:04/29/12<BR> Expected time: 4:13 PM<BR> Means of arrival:Ambulance<BR> Comments:<BR> weakness

## 2012-04-29 NOTE — Discharge Instructions (Signed)
Fecal Impaction A fecal impaction happens when there is a large, firm amount of stool (poop) that cannot be passed. The impacted stool is usually in the rectum, which is the lowest part of the large bowel. The impacted stool can block the colon and cause significant problems. CAUSES  The longer stool stays in the rectum, the harder it gets. Anything that slows down your bowel movements can lead to fecal impaction. These conditions include:  Constipation (having firm hard stools). This can be a longstanding (chronic) problem, or can happen suddenly (acutely).   Painful conditions of the rectum, such as hemorrhoids or anal fissures. The pain of these conditions can make you try to avoid having bowel movements.   Narcotic pain medications cause constipation, which can sometimes be severe. If you take narcotic pain medication, you should also talk with your caregiver about preventing constipation.   Not getting enough fluids.   Inactivity and bed rest over long periods of time.  SYMPTOMS  Some symptoms of fecal impaction include:  Lack of normal bowel movements or changes in bowel patterns.   Sense of fullness in the rectum, but unable to pass stool.   Pain or cramps in the stomach or abdominal area (often after meals).   Thin, watery discharge from the rectum.  Without treatment, a fecal impaction can block the colon and cause severe abdominal pain or colon tears (perforation). This may lead to surgery.  DIAGNOSIS  Fecal impaction is suspected based upon your symptoms and upon a physical examination. This will include an exam of your rectum, which can confirm the diagnosis. Sometimes x-rays or lab testing may be needed to confirm the diagnosis and be sure there are no other problems.  TREATMENT   Initially an impaction can be removed manually. Your caregiver, using a gloved finger, can remove hard stool from your rectum.   Medication is sometimes needed. A suppository or enema can be  given in the rectum to soften the stool. This can stimulate a bowel movement. Medicines can also be given by mouth (orally).   Surgery may be needed if the colon has torn (perforated) due to blockage. This is very rare.  HOME CARE INSTRUCTIONS   Develop regular bowel habits. This may be something such as getting in the habit of having a bowel movement after your morning cup of coffee or after eating. Be sure to allow yourself enough time on the toilet.   Maintain a high fiber diet.   Drink plenty of fluids each day. This is especially true for the elderly and especially during cold weather when thirst may not be as strong. Try to take in at least eight, 8 ounce glasses of water daily unless instructed otherwise.   Exercise regularly.   If you begin to get constipated, increase the amount of fiber in your diet. Eat plenty of fruits, vegetables, whole wheat breads, bran, oatmeal and similar products.   Natural fiber laxatives such as Metamucil are also very helpful.   Speak with your caregiver if you suspect medications may be causing constipation.  SEEK MEDICAL CARE IF:   You have ongoing constipation or a hard time passing your stools.   You have ongoing rectal pain.   You require enemas or suppositories more than twice a week.  SEEK IMMEDIATE MEDICAL CARE IF:   There are continued problems or you develop abdominal pain.   You develop rectal bleeding.   You develop black or tarry stools or feel lightheaded.  MAKE SURE YOU:     Understand these instructions.   Will watch your condition.   Will get help right away if you are not doing well or get worse.  Document Released: 09/08/2004 Document Revised: 12/06/2011 Document Reviewed: 08/26/2008 Exit[employer] Patient Information 2012 [employer]. 

## 2012-04-30 ENCOUNTER — Telehealth (INDEPENDENT_AMBULATORY_CARE_PROVIDER_SITE_OTHER): Payer: Self-pay

## 2012-04-30 NOTE — Telephone Encounter (Signed)
I called pt per Dr Jacinto Halim request to check on pts status after he ER visit. Pt states she is much improved and had normal BM this morning. Pt instructed to take miralax TID and if having loose stools or going too often can decrease to BID every day per Dr Jacinto Halim order. Pt to call if she has any concerns or constipation issues.

## 2012-05-07 ENCOUNTER — Ambulatory Visit (INDEPENDENT_AMBULATORY_CARE_PROVIDER_SITE_OTHER): Payer: Medicare Other | Admitting: *Deleted

## 2012-05-07 DIAGNOSIS — Z7901 Long term (current) use of anticoagulants: Secondary | ICD-10-CM

## 2012-05-07 DIAGNOSIS — I4891 Unspecified atrial fibrillation: Secondary | ICD-10-CM

## 2012-05-30 ENCOUNTER — Ambulatory Visit (INDEPENDENT_AMBULATORY_CARE_PROVIDER_SITE_OTHER): Payer: Medicare Other | Admitting: *Deleted

## 2012-05-30 ENCOUNTER — Encounter (INDEPENDENT_AMBULATORY_CARE_PROVIDER_SITE_OTHER): Payer: Self-pay | Admitting: General Surgery

## 2012-05-30 ENCOUNTER — Ambulatory Visit (INDEPENDENT_AMBULATORY_CARE_PROVIDER_SITE_OTHER): Payer: Medicare Other | Admitting: General Surgery

## 2012-05-30 VITALS — BP 130/70 | HR 76 | Temp 97.7°F | Resp 14 | Ht 63.0 in | Wt 179.2 lb

## 2012-05-30 DIAGNOSIS — Z7901 Long term (current) use of anticoagulants: Secondary | ICD-10-CM

## 2012-05-30 DIAGNOSIS — I4891 Unspecified atrial fibrillation: Secondary | ICD-10-CM

## 2012-05-30 DIAGNOSIS — D126 Benign neoplasm of colon, unspecified: Secondary | ICD-10-CM

## 2012-05-30 DIAGNOSIS — D122 Benign neoplasm of ascending colon: Secondary | ICD-10-CM

## 2012-05-30 NOTE — Progress Notes (Signed)
Subjective:     Patient ID: [name], female   DOB: 1929-01-27, 78 y.o.   MRN: [id number]  HPI This very pleasant 78 year old woman underwent laparoscopic lysis of adhesions, biopsy of the liver mass, a laparoscopic right colectomy on April 09, 2011. Final pathology report showed a benign tubular adenoma of the colon, a degenerating hyalinized cyst of the liver.  She did well postop except that she had to go to the emergency room on April 30 and was disimpacted manually.  She has chronic constipation and now has her bowel regimen back in order. She takes Senna and MiraLax daily  as she has done for years. She states that she has 2 soft stools daily and that's typical for her. She has no pain no nausea and otherwise is doing well.  Review of Systems     Objective:   Physical Exam Patient looks well and is in no distress.  Abdomen is soft and nontender. All incisions and trocar sites are well healed. No hernias.    Assessment:     Status post laparoscopic right colectomy and lysis of adhesions and liver biopsy, uneventful recovery other than transient problems with chronic constipation.    Plan:     Continue daily bowel regimen to avoid complications of chronic constipation.  Exercise with walking daily.  I've asked her to call Dr. Randa Evens and consider followup colonoscopy in one year  Return to see me p.r.n.   Angelia Mould. Derrell Lolling, M.D., [facility] General and Minimally invasive Surgery Breast and Colorectal Surgery Office:   [phone] Pager:   [phone]

## 2012-05-30 NOTE — Patient Instructions (Signed)
You have recovered from your colon surgery without any obvious complications. Continue your daily bowel regimen for chronic constipation.  Contact Dr. Carman Ching for a colonoscopy in one year.  Return to see Dr. Derrell Lolling if any further surgical problems arise.

## 2012-06-30 ENCOUNTER — Ambulatory Visit (INDEPENDENT_AMBULATORY_CARE_PROVIDER_SITE_OTHER): Payer: Medicare Other | Admitting: Pharmacist

## 2012-06-30 DIAGNOSIS — Z7901 Long term (current) use of anticoagulants: Secondary | ICD-10-CM

## 2012-06-30 DIAGNOSIS — I4891 Unspecified atrial fibrillation: Secondary | ICD-10-CM

## 2012-07-17 ENCOUNTER — Other Ambulatory Visit: Payer: Self-pay | Admitting: Cardiovascular Disease

## 2012-07-28 ENCOUNTER — Ambulatory Visit (INDEPENDENT_AMBULATORY_CARE_PROVIDER_SITE_OTHER): Payer: Medicare Other | Admitting: *Deleted

## 2012-07-28 DIAGNOSIS — I4891 Unspecified atrial fibrillation: Secondary | ICD-10-CM

## 2012-07-28 DIAGNOSIS — Z7901 Long term (current) use of anticoagulants: Secondary | ICD-10-CM

## 2012-08-26 ENCOUNTER — Ambulatory Visit (INDEPENDENT_AMBULATORY_CARE_PROVIDER_SITE_OTHER): Payer: Medicare Other | Admitting: *Deleted

## 2012-08-26 ENCOUNTER — Encounter: Payer: Self-pay | Admitting: Cardiovascular Disease

## 2012-08-26 ENCOUNTER — Ambulatory Visit (INDEPENDENT_AMBULATORY_CARE_PROVIDER_SITE_OTHER): Payer: Medicare Other | Admitting: Cardiovascular Disease

## 2012-08-26 VITALS — BP 139/82 | HR 54 | Ht 63.0 in | Wt 179.0 lb

## 2012-08-26 DIAGNOSIS — I4891 Unspecified atrial fibrillation: Secondary | ICD-10-CM

## 2012-08-26 DIAGNOSIS — Z7901 Long term (current) use of anticoagulants: Secondary | ICD-10-CM

## 2012-08-26 LAB — POCT INR: INR: 2.8

## 2012-08-26 NOTE — Progress Notes (Signed)
History of Present Illness: 78 yo WF with history of colon cancer, hypothyroidism, HLD, HTN, hemochromatosis, atrial fibrillation here today for cardiac follow up. When I met her in September of 2012, she was in sinus rhythm. She has been continued on coumadin. Her echo showed mild aortic valve insufficiency and mild to moderate mitral regurgitation in August 2012. I saw her last in March 2013 and she was planning a surgical procedure at that time. She underwent laparoscopic lysis of adhesions, biopsy of the liver mass, a laparoscopic right colectomy on April 09, 2011 per Dr. Derrell Lolling. Final pathology report showed a benign tubular adenoma of the colon, a degenerating hyalinized cyst of the liver. She did well postop except that she had to go to the emergency room on April 30 and was disimpacted manually. She also had the shingles in July/August 2013.   She is here today for cardiac follow up.  She is feeling well. No chest pain or SOB. EKG is NSR today. No dizziness, near syncope or syncope. She is asking about having left knee and hip surgery.   Primary Care Physician: Dr. Burton Apley.  Fasting Lipids: Followed in primary care.    Past Medical History  Diagnosis Date  . HTN (hypertension)   . Environmental allergies   . Arthritis   . Thyroid disease   . History of colon cancer   . Hemochromatosis 1998  . Atrial fibrillation   . Constipation   . Arthritis pain   . Cancer   . PONV (postoperative nausea and vomiting)   . Varicose vein of leg     Past Surgical History  Procedure Date  . Colon surgery 1996    CANCER REMOVAL  . Total hip arthroplasty 2004    RIGHT  . Gallbladder surgery 2005  . Breast surgery 1991    Rt br lump removed-benign  . Liver biopsy 04/08/2012    Procedure: LIVER BIOPSY;  Surgeon: Ernestene Mention, MD;  Location: [facility] OR;  Service: General;;  laparoscopic    Current Outpatient Prescriptions  Medication Sig Dispense Refill  . acetaminophen (TYLENOL) 500 MG  tablet Take 500 mg by mouth every 6 (six) hours as needed. For pain      . amLODipine (NORVASC) 2.5 MG tablet Take 2.5 mg by mouth daily.       Marland Kitchen atorvastatin (LIPITOR) 10 MG tablet Take 10 mg by mouth Daily.       Marland Kitchen diltiazem (CARDIZEM CD) 120 MG 24 hr capsule Take 120 mg by mouth daily.      . furosemide (LASIX) 20 MG tablet Take 20 mg by mouth Daily.       Marland Kitchen levothyroxine (SYNTHROID, LEVOTHROID) 50 MCG tablet Take 25 mcg by mouth Daily.       . Misc Natural Products (OSTEO BI-FLEX JOINT SHIELD PO) Take 1 tablet by mouth 2 (two) times daily.       . Omega-3 Fatty Acids (FISH OIL) 1200 MG CAPS Take 1 capsule by mouth daily.       Marland Kitchen PEG 3350-KCl-NaBcb-NaCl-NaSulf (PEG-3350/ELECTROLYTES) 236 G SOLR       . polyethylene glycol (MIRALAX / GLYCOLAX) packet Take 17 g by mouth daily as needed. For constipation      . senna (SENOKOT) 8.6 MG TABS Take 1 tablet by mouth daily as needed. For constipation      . vitamin B-12 (CYANOCOBALAMIN) 100 MCG tablet Take 50 mcg by mouth daily.        . vitamin E 400  UNIT capsule Take 400 Units by mouth daily.        Marland Kitchen warfarin (COUMADIN) 3 MG tablet Take 1.5-3 mg by mouth See admin instructions. 3 mg on all days except Fridays and Takes 1.5 mg on fridays only.      . warfarin (COUMADIN) 3 MG tablet USE AS DIRECTED BY THE ANTICOAGULATION CLINIC.  30 tablet  3    Allergies  Allergen Reactions  . Adhesive (Tape) Rash  . Latex Rash    Per Patient Latex=lip swelling    History   Social History  . Marital Status: Married    Spouse Name: N/A    Number of Children: N/A  . Years of Education: N/A   Occupational History  . RETIRED [employer]   Social History Main Topics  . Smoking status: Never Smoker   . Smokeless tobacco: Not on file  . Alcohol Use: No  . Drug Use: No  . Sexually Active: Not on file   Other Topics Concern  . Not on file   Social History Narrative  . No narrative on file    Family History  Problem Relation Age of Onset  . Heart  disease Mother   . Stroke Mother   . Heart disease Father   . Pneumonia Brother     Review of Systems:  As stated in the HPI and otherwise negative.   BP 139/82  Pulse 54  Ht 5\' 3"  (1.6 m)  Wt 179 lb (81.194 kg)  BMI 31.71 kg/m2  Physical Examination: General: Well developed, well nourished, NAD HEENT: OP clear, mucus membranes moist SKIN: warm, dry. No rashes. Neuro: No focal deficits Musculoskeletal: Muscle strength 5/5 all ext Psychiatric: Mood and affect normal Neck: No JVD, no carotid bruits, no thyromegaly, no lymphadenopathy. Lungs:Clear bilaterally, no wheezes, rhonci, crackles Cardiovascular: Regular rate and rhythm. Systolic murmur. No gallops or rubs. Abdomen:Soft. Bowel sounds present. Non-tender.  Extremities: No lower extremity edema. Pulses are 2 + in the bilateral DP/PT.  EKG: NSR, rate 62 bpm. Poor R wave progression through the precordial leads.   Assessment and Plan:   1. Atrial fibrillation:  She is in sinus today. No palpitations at home. Continue coumadin. Continue Cardizem.   2. Mitral regurgitation: Mild to moderate MR by echo 2012. Will repeat echo this fall.

## 2012-08-26 NOTE — Patient Instructions (Addendum)

## 2012-09-04 ENCOUNTER — Other Ambulatory Visit: Payer: Self-pay | Admitting: Internal Medicine

## 2012-09-04 DIAGNOSIS — Z1231 Encounter for screening mammogram for malignant neoplasm of breast: Secondary | ICD-10-CM

## 2012-09-23 ENCOUNTER — Ambulatory Visit (HOSPITAL_COMMUNITY): Payer: Medicare Other | Attending: Cardiology | Admitting: Radiology

## 2012-09-23 ENCOUNTER — Ambulatory Visit (INDEPENDENT_AMBULATORY_CARE_PROVIDER_SITE_OTHER): Payer: Medicare Other | Admitting: *Deleted

## 2012-09-23 DIAGNOSIS — Z7901 Long term (current) use of anticoagulants: Secondary | ICD-10-CM

## 2012-09-23 DIAGNOSIS — I080 Rheumatic disorders of both mitral and aortic valves: Secondary | ICD-10-CM | POA: Insufficient documentation

## 2012-09-23 DIAGNOSIS — I059 Rheumatic mitral valve disease, unspecified: Secondary | ICD-10-CM

## 2012-09-23 DIAGNOSIS — I369 Nonrheumatic tricuspid valve disorder, unspecified: Secondary | ICD-10-CM | POA: Insufficient documentation

## 2012-09-23 DIAGNOSIS — I4891 Unspecified atrial fibrillation: Secondary | ICD-10-CM | POA: Insufficient documentation

## 2012-09-23 DIAGNOSIS — I379 Nonrheumatic pulmonary valve disorder, unspecified: Secondary | ICD-10-CM | POA: Insufficient documentation

## 2012-09-23 DIAGNOSIS — I10 Essential (primary) hypertension: Secondary | ICD-10-CM | POA: Insufficient documentation

## 2012-09-23 LAB — POCT INR: INR: 3.7

## 2012-09-23 NOTE — Progress Notes (Signed)
Echocardiogram performed.  

## 2012-09-25 ENCOUNTER — Telehealth: Payer: Self-pay | Admitting: Cardiovascular Disease

## 2012-09-25 NOTE — Telephone Encounter (Signed)
Talked with pt and she states that her left thigh has more edema and has noted some discoloration back of thigh so pt instructed to call PCP  Or go to er regarding this and to take her coumadin report with her. Pt states understanding

## 2012-09-25 NOTE — Telephone Encounter (Signed)
New Problem:    Patient called in because the bruise down the side of her left leg has gotten worse.  Please call back.

## 2012-10-07 ENCOUNTER — Ambulatory Visit
Admission: RE | Admit: 2012-10-07 | Discharge: 2012-10-07 | Disposition: A | Discharge status: Home / Self Care | Payer: Medicare Other | Source: Ambulatory Visit | Attending: Internal Medicine | Admitting: Internal Medicine

## 2012-10-07 DIAGNOSIS — Z1231 Encounter for screening mammogram for malignant neoplasm of breast: Secondary | ICD-10-CM

## 2012-10-10 ENCOUNTER — Ambulatory Visit (INDEPENDENT_AMBULATORY_CARE_PROVIDER_SITE_OTHER): Payer: Medicare Other | Admitting: *Deleted

## 2012-10-10 DIAGNOSIS — I4891 Unspecified atrial fibrillation: Secondary | ICD-10-CM

## 2012-10-10 DIAGNOSIS — Z7901 Long term (current) use of anticoagulants: Secondary | ICD-10-CM

## 2012-10-22 ENCOUNTER — Ambulatory Visit (INDEPENDENT_AMBULATORY_CARE_PROVIDER_SITE_OTHER): Payer: Medicare Other | Admitting: Pharmacist

## 2012-10-22 DIAGNOSIS — Z7901 Long term (current) use of anticoagulants: Secondary | ICD-10-CM

## 2012-10-22 DIAGNOSIS — I4891 Unspecified atrial fibrillation: Secondary | ICD-10-CM

## 2012-10-22 LAB — POCT INR: INR: 2.5

## 2012-10-25 IMAGING — CR DG CHEST 2V
2 series · 2 of 2 positions shown · non-contrast
Comparison: Chest x-rays dated 08/29/2011 and 02/03/2007

CLINICAL DATA: Preoperative respiratory exam.  Villous adenoma of
the cecum.

CHEST - 2 VIEW

[view not recorded (1 of 2)]
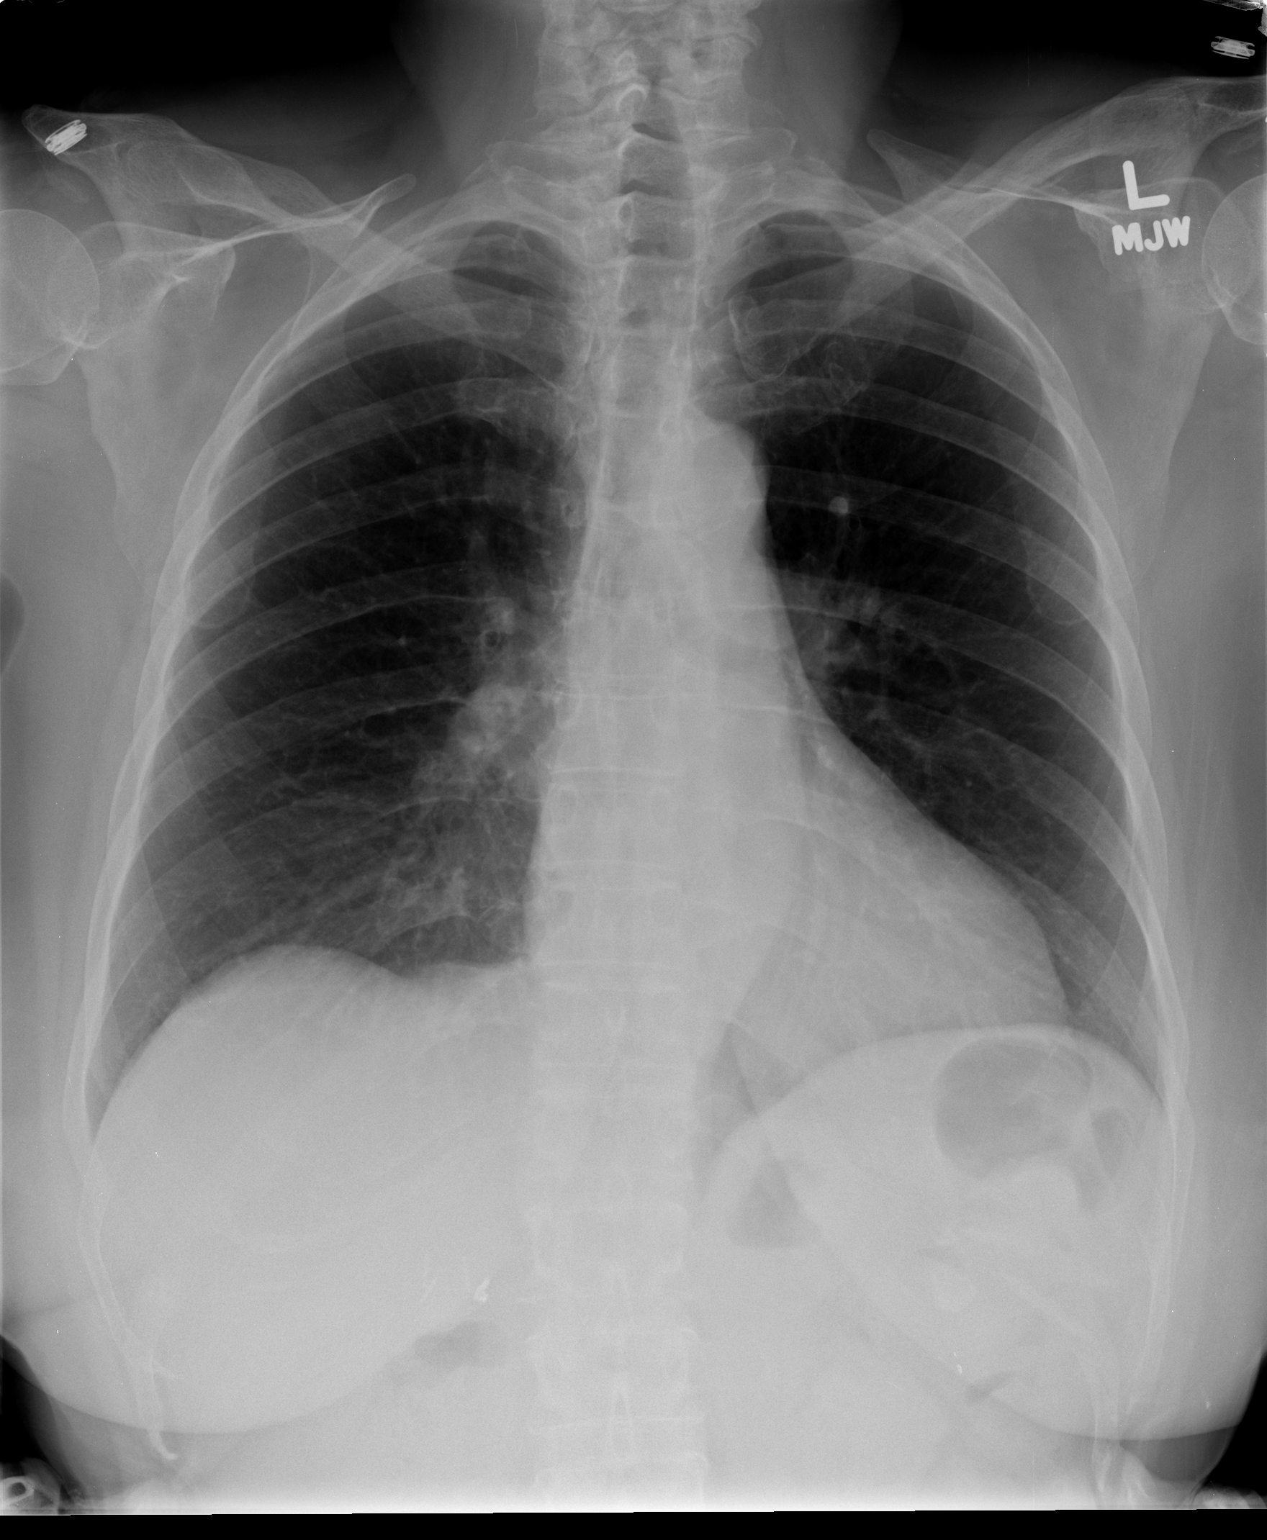

[view not recorded (2 of 2)]
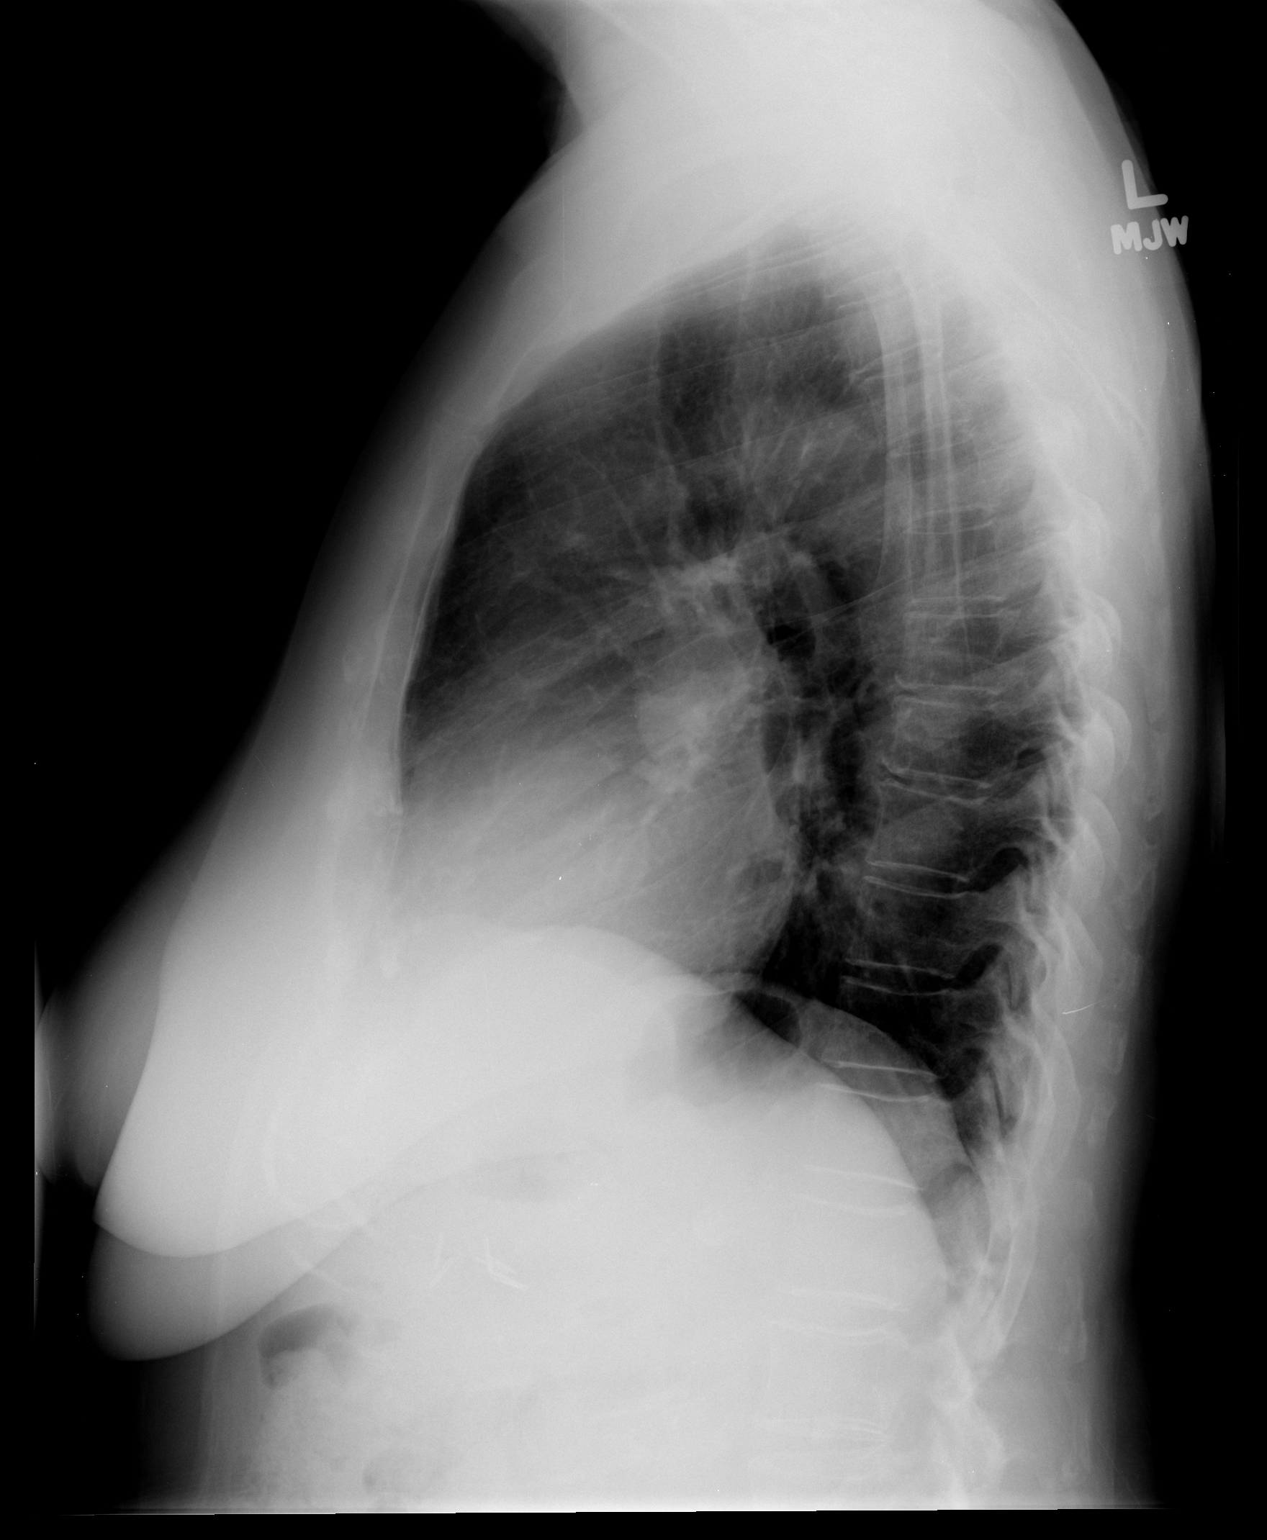

[2 of 2 positions shown; findings below may reference images not displayed]

FINDINGS: The heart size and pulmonary vascularity are normal and
the lungs are clear.  No osseous abnormality.
IMPRESSION: Normal chest.

## 2012-11-12 ENCOUNTER — Ambulatory Visit (INDEPENDENT_AMBULATORY_CARE_PROVIDER_SITE_OTHER): Payer: Medicare Other | Admitting: *Deleted

## 2012-11-12 DIAGNOSIS — I4891 Unspecified atrial fibrillation: Secondary | ICD-10-CM

## 2012-11-12 DIAGNOSIS — Z7901 Long term (current) use of anticoagulants: Secondary | ICD-10-CM

## 2012-11-12 LAB — POCT INR: INR: 2.4

## 2012-11-20 ENCOUNTER — Other Ambulatory Visit: Payer: Self-pay | Admitting: Cardiovascular Disease

## 2012-11-22 IMAGING — CR DG ABDOMEN 2V
4 series · 4 of 4 positions shown · non-contrast
Comparison: Chest CT 08/29/2011.  Abdomen and pelvis CT 01/10/2004.

CLINICAL DATA: 82-year-old female with abdominal pain nausea
constipation.  Recent abdominal surgery.

ABDOMEN - 2 VIEW

[w abdomen decub (1 of 2)]
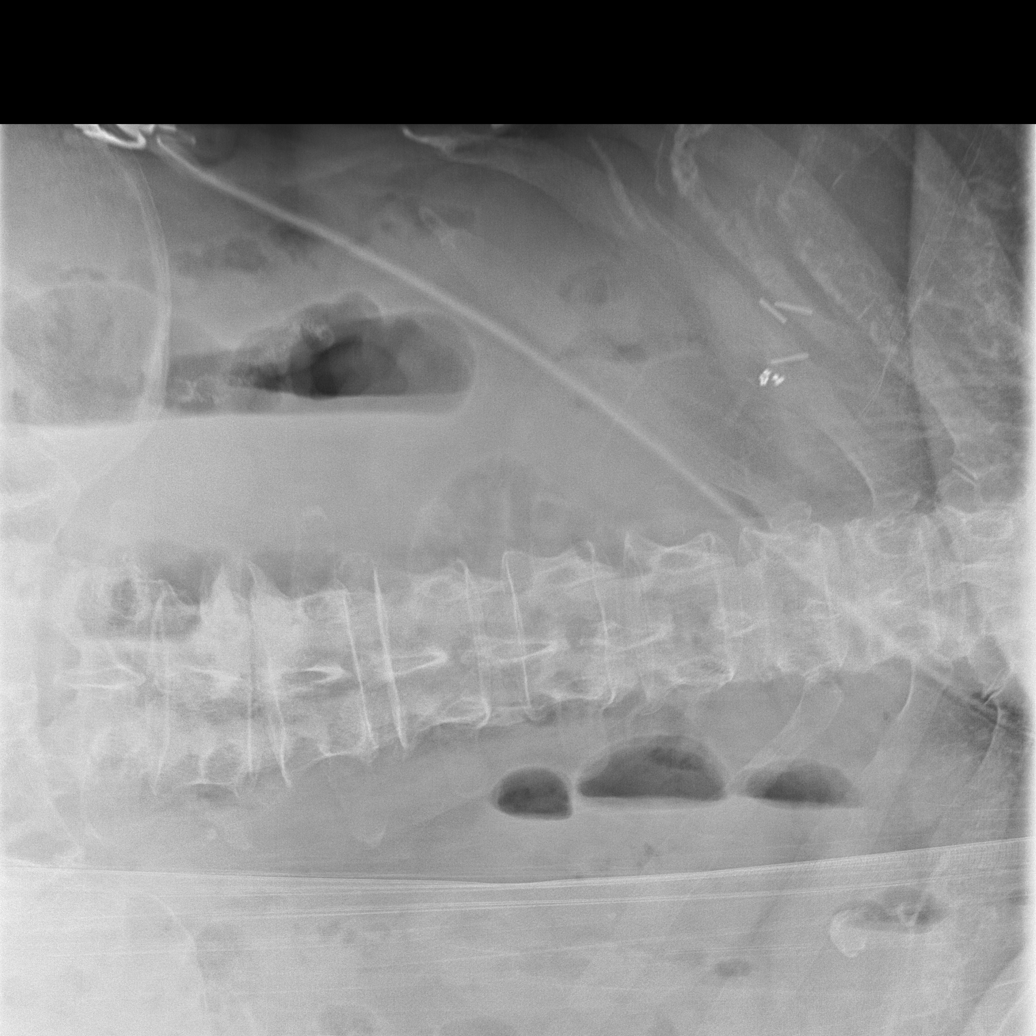

[w abdomen decub (2 of 2)]
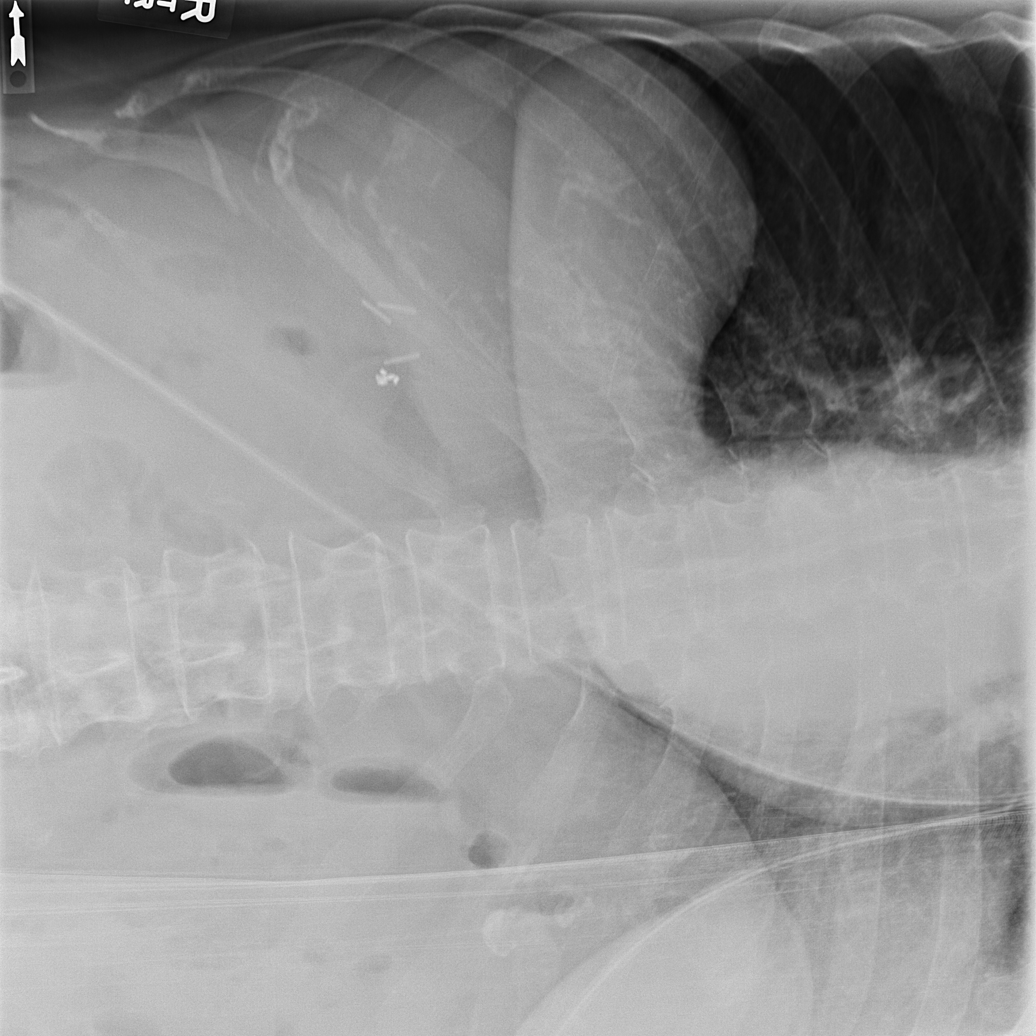

[x abdomen supine (1 of 2)]
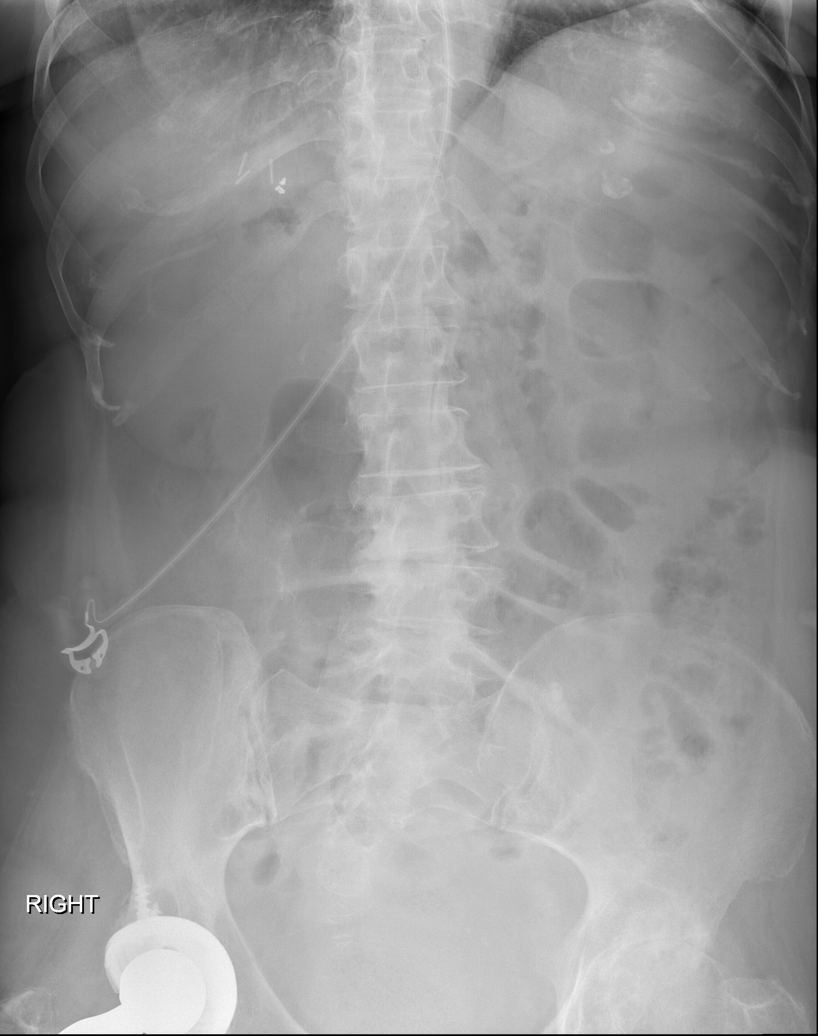

[x abdomen supine (2 of 2)]
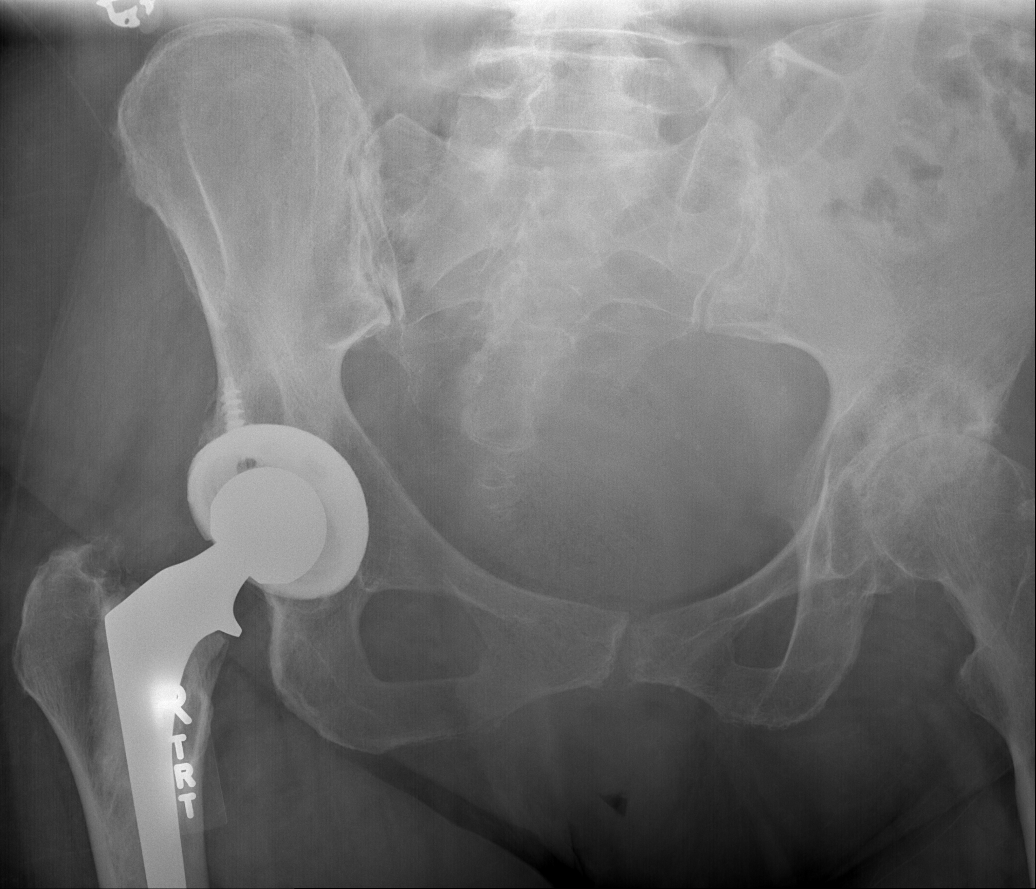

[4 of 4 positions shown; findings below may reference images not displayed]

FINDINGS: No pneumoperitoneum on the left side down lateral
decubitus views.  Stable right upper quadrant surgical clips.
Vascular calcifications most conspicuous at the splenic hilum.
Chronic postoperative changes to the right hip. Nonobstructed bowel
gas pattern.  Grossly negative lung bases.  Chronic degenerative
changes in the lumbar spine.  Advanced left hip osteoarthritis. No
acute osseous abnormality identified.
IMPRESSION: Nonobstructed bowel gas pattern, no free air.

## 2012-12-10 ENCOUNTER — Ambulatory Visit (INDEPENDENT_AMBULATORY_CARE_PROVIDER_SITE_OTHER): Payer: Medicare Other | Admitting: *Deleted

## 2012-12-10 DIAGNOSIS — I4891 Unspecified atrial fibrillation: Secondary | ICD-10-CM

## 2012-12-10 DIAGNOSIS — Z7901 Long term (current) use of anticoagulants: Secondary | ICD-10-CM

## 2013-01-19 ENCOUNTER — Ambulatory Visit (INDEPENDENT_AMBULATORY_CARE_PROVIDER_SITE_OTHER): Payer: Medicare Other | Admitting: Pharmacist

## 2013-01-19 DIAGNOSIS — Z7901 Long term (current) use of anticoagulants: Secondary | ICD-10-CM

## 2013-01-19 DIAGNOSIS — I4891 Unspecified atrial fibrillation: Secondary | ICD-10-CM

## 2013-02-17 ENCOUNTER — Ambulatory Visit (INDEPENDENT_AMBULATORY_CARE_PROVIDER_SITE_OTHER): Payer: Medicare Other | Admitting: Cardiovascular Disease

## 2013-02-17 ENCOUNTER — Ambulatory Visit (INDEPENDENT_AMBULATORY_CARE_PROVIDER_SITE_OTHER): Payer: Medicare Other | Admitting: *Deleted

## 2013-02-17 ENCOUNTER — Encounter: Payer: Self-pay | Admitting: Cardiovascular Disease

## 2013-02-17 VITALS — BP 149/94 | HR 87 | Ht 63.0 in | Wt 185.8 lb

## 2013-02-17 DIAGNOSIS — Z7901 Long term (current) use of anticoagulants: Secondary | ICD-10-CM

## 2013-02-17 DIAGNOSIS — I10 Essential (primary) hypertension: Secondary | ICD-10-CM

## 2013-02-17 DIAGNOSIS — I4891 Unspecified atrial fibrillation: Secondary | ICD-10-CM

## 2013-02-17 NOTE — Patient Instructions (Addendum)
Your physician wants you to follow-up in:  6 months. You will receive a reminder letter in the mail two months in advance. If you don't receive a letter, please call our office to schedule the follow-up appointment.   

## 2013-02-17 NOTE — Progress Notes (Signed)
History of Present Illness: [name] with history of colon cancer, hypothyroidism, HLD, HTN, hemochromatosis, atrial fibrillation here today for cardiac follow up. When I met her in September of 2012, she was in sinus rhythm. She has been continued on coumadin. Her most recent echo September 2013 showed mild AI, moderate MR, normal LV function. echo showed mild aortic valve insufficiency and mild to moderate mitral regurgitation in August 2012. I saw her last in August 2013 and she was planning a surgical procedure at that time. She underwent laparoscopic lysis of adhesions, biopsy of the liver mass, a laparoscopic right colectomy on April 09, 2011 per Dr. Derrell Lolling. Final pathology report showed a benign tubular adenoma of the colon, a degenerating hyalinized cyst of the liver.  She is here today for cardiac follow up. She is feeling well. No chest pain or SOB. No dizziness, near syncope or syncope.   Primary Care Physician: Dr. Burton Apley.  Last Lipid Profile:Lipid Panel     Component Value Date/Time   CHOL 153 08/29/2011 0235   TRIG 80 08/29/2011 0235   HDL 64 08/29/2011 0235   CHOLHDL 2.4 08/29/2011 0235   VLDL 16 08/29/2011 0235   LDLCALC 73 08/29/2011 0235     Past Medical History  Diagnosis Date  . HTN (hypertension)   . Environmental allergies   . Arthritis   . Thyroid disease   . History of colon cancer   . Hemochromatosis 1998  . Atrial fibrillation   . Constipation   . Arthritis pain   . Cancer   . PONV (postoperative nausea and vomiting)   . Varicose vein of leg     Past Surgical History  Procedure Laterality Date  . Colon surgery  1996    CANCER REMOVAL  . Total hip arthroplasty  2004    RIGHT  . Gallbladder surgery  2005  . Breast surgery  1991    Rt br lump removed-benign  . Liver biopsy  04/08/2012    Procedure: LIVER BIOPSY;  Surgeon: Ernestene Mention, MD;  Location: [facility] OR;  Service: General;;  laparoscopic    Current Outpatient Prescriptions  Medication  Sig Dispense Refill  . acetaminophen (TYLENOL) 500 MG tablet Take 500 mg by mouth every 6 (six) hours as needed. For pain      . atorvastatin (LIPITOR) 10 MG tablet Take 10 mg by mouth Daily.       Marland Kitchen diltiazem (CARDIZEM CD) 240 MG 24 hr capsule Take 240 mg by mouth daily.      . furosemide (LASIX) 20 MG tablet Take 20 mg by mouth Daily.       Marland Kitchen levothyroxine (SYNTHROID, LEVOTHROID) 50 MCG tablet Take 25 mcg by mouth Daily.       . meclizine (ANTIVERT) 25 MG tablet Take 25 mg by mouth 2 (two) times daily as needed.      . Misc Natural Products (OSTEO BI-FLEX JOINT SHIELD PO) Take 1 tablet by mouth 2 (two) times daily.       . Omega-3 Fatty Acids (FISH OIL) 1200 MG CAPS Take 1 capsule by mouth daily.       Marland Kitchen PEG 3350-KCl-NaBcb-NaCl-NaSulf (PEG-3350/ELECTROLYTES) 236 G SOLR       . polyethylene glycol (MIRALAX / GLYCOLAX) packet Take 17 g by mouth daily as needed. For constipation      . senna (SENOKOT) 8.6 MG TABS Take 1 tablet by mouth daily as needed. For constipation      . vitamin B-12 (CYANOCOBALAMIN)  100 MCG tablet Take 50 mcg by mouth daily.        . vitamin E 400 UNIT capsule Take 400 Units by mouth daily.        Marland Kitchen warfarin (COUMADIN) 3 MG tablet Take 1.5-3 mg by mouth See admin instructions. 3 mg on all days except Fridays and Takes 1.5 mg on fridays only.      . warfarin (COUMADIN) 3 MG tablet USE AS DIRECTED BY THE ANTICOAGULATION CLINIC.  30 tablet  3   No current facility-administered medications for this visit.    Allergies  Allergen Reactions  . Adhesive (Tape) Rash  . Latex Rash    Per Patient Latex=lip swelling    History   Social History  . Marital Status: Married    Spouse Name: N/A    Number of Children: N/A  . Years of Education: N/A   Occupational History  . RETIRED [employer]   Social History Main Topics  . Smoking status: Never Smoker   . Smokeless tobacco: Not on file  . Alcohol Use: No  . Drug Use: No  . Sexually Active: Not on file   Other Topics  Concern  . Not on file   Social History Narrative  . No narrative on file    Family History  Problem Relation Age of Onset  . Heart disease Mother   . Stroke Mother   . Heart disease Father   . Pneumonia Brother     Review of Systems:  As stated in the HPI and otherwise negative.   BP 149/94  Pulse 87  Ht 5\' 3"  (1.6 m)  Wt 185 lb 12.8 oz (84.278 kg)  BMI 32.92 kg/m2  Physical Examination: General: Well developed, well nourished, NAD HEENT: OP clear, mucus membranes moist SKIN: warm, dry. No rashes. Neuro: No focal deficits Musculoskeletal: Muscle strength 5/5 all ext Psychiatric: Mood and affect normal Neck: No JVD, no carotid bruits, no thyromegaly, no lymphadenopathy. Lungs:Clear bilaterally, no wheezes, rhonci, crackles Cardiovascular: Irreg irreg No murmurs, gallops or rubs. Abdomen:Soft. Bowel sounds present. Non-tender.  Extremities: No lower extremity edema. Pulses are 2 + in the bilateral DP/PT.  Echo 09/23/12:  Left ventricle: The cavity size was normal. Wall thickness was normal. Systolic function was normal. The estimated ejection fraction was in the range of 55% to 60%. Wall motion was normal; there were no regional wall motion abnormalities. Doppler parameters are consistent with abnormal left ventricular relaxation (grade 1 diastolic dysfunction). - Aortic valve: Mild regurgitation. - Mitral valve: Moderate regurgitation. - Left atrium: The atrium was moderately dilated. - Pulmonary arteries: Systolic pressure was mildly increased. PA peak pressure: 32mm Hg (S).  Assessment and Plan:   1. Atrial fibrillation: She appears to be in atrial fibrillation today. No palpitations at home. Continue coumadin. Continue Cardizem CD 240 mg po Qdaily. She can hold her coumadin if needed for colonoscopy this spring.   2. Mitral regurgitation: Moderate MR by echo September 2013. Will follow with repeat echo in 12-18 months.

## 2013-03-18 ENCOUNTER — Ambulatory Visit (INDEPENDENT_AMBULATORY_CARE_PROVIDER_SITE_OTHER): Payer: Medicare Other | Admitting: *Deleted

## 2013-03-18 DIAGNOSIS — I4891 Unspecified atrial fibrillation: Secondary | ICD-10-CM

## 2013-03-18 DIAGNOSIS — Z7901 Long term (current) use of anticoagulants: Secondary | ICD-10-CM

## 2013-03-31 ENCOUNTER — Other Ambulatory Visit: Payer: Self-pay | Admitting: *Deleted

## 2013-03-31 ENCOUNTER — Other Ambulatory Visit: Payer: Self-pay | Admitting: Cardiovascular Disease

## 2013-04-07 ENCOUNTER — Other Ambulatory Visit: Payer: Self-pay | Admitting: Internal Medicine

## 2013-04-07 ENCOUNTER — Ambulatory Visit
Admission: RE | Admit: 2013-04-07 | Discharge: 2013-04-07 | Disposition: A | Discharge status: Home / Self Care | Payer: Medicare Other | Source: Ambulatory Visit | Attending: Internal Medicine | Admitting: Internal Medicine

## 2013-04-07 DIAGNOSIS — R05 Cough: Secondary | ICD-10-CM

## 2013-04-15 ENCOUNTER — Ambulatory Visit (INDEPENDENT_AMBULATORY_CARE_PROVIDER_SITE_OTHER): Payer: Medicare Other | Admitting: Pharmacist

## 2013-04-15 DIAGNOSIS — Z7901 Long term (current) use of anticoagulants: Secondary | ICD-10-CM

## 2013-04-15 DIAGNOSIS — I4891 Unspecified atrial fibrillation: Secondary | ICD-10-CM

## 2013-04-24 ENCOUNTER — Encounter (HOSPITAL_COMMUNITY): Payer: Self-pay | Admitting: Emergency Medicine

## 2013-04-24 ENCOUNTER — Emergency Department (HOSPITAL_COMMUNITY): Payer: Medicare Other

## 2013-04-24 ENCOUNTER — Inpatient Hospital Stay (HOSPITAL_COMMUNITY)
Admission: EM | Admit: 2013-04-24 | Discharge: 2013-04-27 | DRG: 203 | Disposition: A | Discharge status: Home / Self Care | Payer: Medicare Other | Attending: Internal Medicine | Admitting: Internal Medicine

## 2013-04-24 DIAGNOSIS — D122 Benign neoplasm of ascending colon: Secondary | ICD-10-CM

## 2013-04-24 DIAGNOSIS — J42 Unspecified chronic bronchitis: Secondary | ICD-10-CM | POA: Diagnosis present

## 2013-04-24 DIAGNOSIS — Z9049 Acquired absence of other specified parts of digestive tract: Secondary | ICD-10-CM

## 2013-04-24 DIAGNOSIS — J40 Bronchitis, not specified as acute or chronic: Secondary | ICD-10-CM

## 2013-04-24 DIAGNOSIS — Z79899 Other long term (current) drug therapy: Secondary | ICD-10-CM

## 2013-04-24 DIAGNOSIS — J45909 Unspecified asthma, uncomplicated: Secondary | ICD-10-CM | POA: Diagnosis present

## 2013-04-24 DIAGNOSIS — K5900 Constipation, unspecified: Secondary | ICD-10-CM | POA: Diagnosis present

## 2013-04-24 DIAGNOSIS — E079 Disorder of thyroid, unspecified: Secondary | ICD-10-CM | POA: Diagnosis present

## 2013-04-24 DIAGNOSIS — R05 Cough: Secondary | ICD-10-CM

## 2013-04-24 DIAGNOSIS — R21 Rash and other nonspecific skin eruption: Secondary | ICD-10-CM | POA: Diagnosis present

## 2013-04-24 DIAGNOSIS — I482 Chronic atrial fibrillation, unspecified: Secondary | ICD-10-CM | POA: Diagnosis present

## 2013-04-24 DIAGNOSIS — I4891 Unspecified atrial fibrillation: Secondary | ICD-10-CM | POA: Diagnosis present

## 2013-04-24 DIAGNOSIS — M129 Arthropathy, unspecified: Secondary | ICD-10-CM | POA: Diagnosis present

## 2013-04-24 DIAGNOSIS — J441 Chronic obstructive pulmonary disease with (acute) exacerbation: Secondary | ICD-10-CM | POA: Insufficient documentation

## 2013-04-24 DIAGNOSIS — J069 Acute upper respiratory infection, unspecified: Secondary | ICD-10-CM

## 2013-04-24 DIAGNOSIS — Z85038 Personal history of other malignant neoplasm of large intestine: Secondary | ICD-10-CM

## 2013-04-24 DIAGNOSIS — I8390 Asymptomatic varicose veins of unspecified lower extremity: Secondary | ICD-10-CM | POA: Diagnosis present

## 2013-04-24 DIAGNOSIS — I10 Essential (primary) hypertension: Secondary | ICD-10-CM

## 2013-04-24 DIAGNOSIS — R3129 Other microscopic hematuria: Secondary | ICD-10-CM | POA: Diagnosis present

## 2013-04-24 DIAGNOSIS — J209 Acute bronchitis, unspecified: Principal | ICD-10-CM | POA: Diagnosis present

## 2013-04-24 DIAGNOSIS — J9600 Acute respiratory failure, unspecified whether with hypoxia or hypercapnia: Secondary | ICD-10-CM | POA: Insufficient documentation

## 2013-04-24 DIAGNOSIS — Z823 Family history of stroke: Secondary | ICD-10-CM

## 2013-04-24 DIAGNOSIS — Z96649 Presence of unspecified artificial hip joint: Secondary | ICD-10-CM

## 2013-04-24 DIAGNOSIS — Z7901 Long term (current) use of anticoagulants: Secondary | ICD-10-CM

## 2013-04-24 DIAGNOSIS — Z01810 Encounter for preprocedural cardiovascular examination: Secondary | ICD-10-CM

## 2013-04-24 DIAGNOSIS — Z8249 Family history of ischemic heart disease and other diseases of the circulatory system: Secondary | ICD-10-CM

## 2013-04-24 LAB — COMPREHENSIVE METABOLIC PANEL
ALT: 13 U/L (ref 0–35)
AST: 19 U/L (ref 0–37)
Albumin: 3.6 g/dL (ref 3.5–5.2)
Alkaline Phosphatase: 125 U/L — ABNORMAL HIGH (ref 39–117)
Potassium: 3.8 mEq/L (ref 3.5–5.1)
Sodium: 136 mEq/L (ref 135–145)
Total Protein: 7 g/dL (ref 6.0–8.3)

## 2013-04-24 LAB — CBC WITH DIFFERENTIAL/PLATELET
Basophils Relative: 0 % (ref 0–1)
Eosinophils Absolute: 0 10*3/uL (ref 0.0–0.7)
MCH: 32.3 pg (ref 26.0–34.0)
MCHC: 34.3 g/dL (ref 30.0–36.0)
Neutro Abs: 4.9 10*3/uL (ref 1.7–7.7)
Neutrophils Relative %: 82 % — ABNORMAL HIGH (ref 43–77)
Platelets: 158 10*3/uL (ref 150–400)
RBC: 4.39 MIL/uL (ref 3.87–5.11)

## 2013-04-24 LAB — URINALYSIS, ROUTINE W REFLEX MICROSCOPIC
Bilirubin Urine: NEGATIVE
Glucose, UA: 100 mg/dL — AB
Ketones, ur: NEGATIVE mg/dL
Leukocytes, UA: NEGATIVE
pH: 6.5 (ref 5.0–8.0)

## 2013-04-24 LAB — PROTIME-INR: INR: 1.63 — ABNORMAL HIGH (ref 0.00–1.49)

## 2013-04-24 LAB — POCT I-STAT TROPONIN I: Troponin i, poc: 0 ng/mL (ref 0.00–0.08)

## 2013-04-24 LAB — URINE MICROSCOPIC-ADD ON

## 2013-04-24 MED ORDER — ACETAMINOPHEN 500 MG PO TABS: 500.0000 mg | ORAL_TABLET | Freq: Four times a day (QID) | ORAL | Status: DC | PRN

## 2013-04-24 MED ORDER — WARFARIN SODIUM 5 MG PO TABS
5.0000 mg | ORAL_TABLET | ORAL | Status: AC
  Administered 2013-04-24: 5 mg via ORAL
  Filled 2013-04-24: qty 1

## 2013-04-24 MED ORDER — LEVOTHYROXINE SODIUM 25 MCG PO TABS
25.0000 ug | ORAL_TABLET | Freq: Every day | ORAL | Status: DC
  Administered 2013-04-24 – 2013-04-27 (×3): 25 ug via ORAL
  Filled 2013-04-24 (×4): qty 1

## 2013-04-24 MED ORDER — DEXTROSE 5 % IV SOLN
1.0000 g | INTRAVENOUS | Status: DC
  Administered 2013-04-24 – 2013-04-26 (×3): 1 g via INTRAVENOUS
  Filled 2013-04-24 (×3): qty 10

## 2013-04-24 MED ORDER — SODIUM CHLORIDE 0.9 % IV SOLN
Freq: Once | INTRAVENOUS | Status: AC
  Administered 2013-04-24: 12:00:00 via INTRAVENOUS

## 2013-04-24 MED ORDER — MORPHINE SULFATE 4 MG/ML IJ SOLN
4.0000 mg | Freq: Once | INTRAMUSCULAR | Status: DC
  Filled 2013-04-24: qty 1

## 2013-04-24 MED ORDER — DIPHENHYDRAMINE HCL 50 MG/ML IJ SOLN: 25.0000 mg | Freq: Once | INTRAMUSCULAR | Status: DC

## 2013-04-24 MED ORDER — ALBUTEROL SULFATE (5 MG/ML) 0.5% IN NEBU
2.5000 mg | INHALATION_SOLUTION | Freq: Two times a day (BID) | RESPIRATORY_TRACT | Status: DC
  Administered 2013-04-25 (×2): 2.5 mg via RESPIRATORY_TRACT
  Filled 2013-04-24 (×3): qty 0.5

## 2013-04-24 MED ORDER — ACETAMINOPHEN 325 MG PO TABS: 650.0000 mg | ORAL_TABLET | Freq: Four times a day (QID) | ORAL | Status: DC | PRN

## 2013-04-24 MED ORDER — POLYETHYLENE GLYCOL 3350 17 G PO PACK
17.0000 g | PACK | Freq: Every day | ORAL | Status: DC | PRN
  Filled 2013-04-24: qty 1

## 2013-04-24 MED ORDER — LEVALBUTEROL HCL 0.63 MG/3ML IN NEBU: 0.6300 mg | INHALATION_SOLUTION | RESPIRATORY_TRACT | Status: DC | PRN

## 2013-04-24 MED ORDER — GUAIFENESIN ER 600 MG PO TB12
600.0000 mg | ORAL_TABLET | Freq: Two times a day (BID) | ORAL | Status: DC
  Administered 2013-04-24 – 2013-04-27 (×6): 600 mg via ORAL
  Filled 2013-04-24 (×7): qty 1

## 2013-04-24 MED ORDER — ALBUTEROL SULFATE (5 MG/ML) 0.5% IN NEBU
2.5000 mg | INHALATION_SOLUTION | Freq: Four times a day (QID) | RESPIRATORY_TRACT | Status: DC
  Administered 2013-04-24: 2.5 mg via RESPIRATORY_TRACT
  Filled 2013-04-24: qty 0.5

## 2013-04-24 MED ORDER — ONDANSETRON HCL 4 MG/2ML IJ SOLN
4.0000 mg | Freq: Once | INTRAMUSCULAR | Status: AC
  Administered 2013-04-24: 4 mg via INTRAVENOUS
  Filled 2013-04-24: qty 2

## 2013-04-24 MED ORDER — BENZONATATE 100 MG PO CAPS
200.0000 mg | ORAL_CAPSULE | Freq: Three times a day (TID) | ORAL | Status: DC | PRN
  Administered 2013-04-26: 200 mg via ORAL
  Filled 2013-04-24 (×2): qty 2

## 2013-04-24 MED ORDER — DEXTROSE 5 % IV SOLN
500.0000 mg | INTRAVENOUS | Status: DC
  Administered 2013-04-24 – 2013-04-26 (×3): 500 mg via INTRAVENOUS
  Filled 2013-04-24 (×3): qty 500

## 2013-04-24 MED ORDER — PREDNISONE 50 MG PO TABS
50.0000 mg | ORAL_TABLET | Freq: Every day | ORAL | Status: DC
  Administered 2013-04-24 – 2013-04-27 (×3): 50 mg via ORAL
  Filled 2013-04-24 (×4): qty 1

## 2013-04-24 MED ORDER — LEVOFLOXACIN IN D5W 500 MG/100ML IV SOLN
500.0000 mg | Freq: Once | INTRAVENOUS | Status: DC
  Administered 2013-04-24: 500 mg via INTRAVENOUS
  Filled 2013-04-24: qty 100

## 2013-04-24 MED ORDER — PREDNISOLONE 5 MG PO TABS: 50.0000 mg | ORAL_TABLET | Freq: Every day | ORAL | Status: DC

## 2013-04-24 MED ORDER — DILTIAZEM HCL ER COATED BEADS 120 MG PO CP24
120.0000 mg | ORAL_CAPSULE | Freq: Every day | ORAL | Status: DC
  Administered 2013-04-25: 120 mg via ORAL
  Filled 2013-04-24 (×2): qty 1

## 2013-04-24 MED ORDER — ATORVASTATIN CALCIUM 10 MG PO TABS
10.0000 mg | ORAL_TABLET | Freq: Every day | ORAL | Status: DC
  Administered 2013-04-24 – 2013-04-26 (×3): 10 mg via ORAL
  Filled 2013-04-24 (×4): qty 1

## 2013-04-24 MED ORDER — FUROSEMIDE 10 MG/ML IJ SOLN
20.0000 mg | Freq: Once | INTRAMUSCULAR | Status: AC
  Administered 2013-04-24: 20 mg via INTRAVENOUS
  Filled 2013-04-24: qty 2

## 2013-04-24 MED ORDER — ONDANSETRON HCL 4 MG/2ML IJ SOLN: 4.0000 mg | Freq: Four times a day (QID) | INTRAMUSCULAR | Status: DC | PRN

## 2013-04-24 MED ORDER — DILTIAZEM HCL ER COATED BEADS 180 MG PO CP24
180.0000 mg | ORAL_CAPSULE | Freq: Every day | ORAL | Status: DC
  Filled 2013-04-24: qty 1

## 2013-04-24 MED ORDER — AMLODIPINE BESYLATE 2.5 MG PO TABS
2.5000 mg | ORAL_TABLET | Freq: Every day | ORAL | Status: DC
  Filled 2013-04-24: qty 1

## 2013-04-24 MED ORDER — WARFARIN - PHARMACIST DOSING INPATIENT
Freq: Every day | Status: DC
  Administered 2013-04-25: 17:00:00

## 2013-04-24 NOTE — H&P (Addendum)
[facility] History and Physical  [name] ZOX:[id number] DOB: 10-20-1929 DOA: 04/24/2013  Referring physician: EDP PCP: Lorenda Peck, MD  Cards: Dr.McAlahney  Chief Complaint: Cough  HPI: [name] is a 79 y.o. female  with history of paroxysmal atrial fibrillation on Coumadin, hypertension presents to the ER with the above-mentioned complaint. Patient reports that she's had mild occasional cough productive of white mucus off and on for the past 2 months. This has changed in the last 2-3 days and she's been coughing much more in her phlegm is thick and yellow now. She's also noticed that she has been wheezing in the last 2-3 days. She denies any fevers but has felt warm, no chills. She denies any PND orthopnea, no lower extremity edema No history of smoking. Upon evaluation the emergency room her BNP was mildly elevated and hence [facility] was consulted as well.   Review of Systems:  patient denies anorexia, fever, weight loss,, vision loss, decreased hearing, hoarseness, chest pain, syncope, dyspnea on exertion, peripheral edema, balance deficits, hemoptysis, abdominal pain, melena, hematochezia, severe indigestion/heartburn, hematuria, incontinence, genital sores, muscle weakness, suspicious skin lesions, transient blindness, difficulty walking, depression, unusual weight change, abnormal bleeding, enlarged lymph nodes, angioedema, and breast masses.    Past Medical History  Diagnosis Date  . HTN (hypertension)   . Environmental allergies   . Arthritis   . Thyroid disease   . History of colon cancer   . Hemochromatosis 1998  . Atrial fibrillation   . Constipation   . Arthritis pain   . Cancer   . PONV (postoperative nausea and vomiting)   . Varicose vein of leg    Past Surgical History  Procedure Laterality Date  . Colon surgery  1996    CANCER REMOVAL  . Total hip arthroplasty  2004    RIGHT  . Gallbladder surgery  2005  . Breast surgery   1991    Rt br lump removed-benign  . Liver biopsy  04/08/2012    Procedure: LIVER BIOPSY;  Surgeon: Ernestene Mention, MD;  Location: [facility] OR;  Service: General;;  laparoscopic   Social History:  reports that she has never smoked. She does not have any smokeless tobacco history on file. She reports that she does not drink alcohol or use illicit drugs. Lives at home with her hsuband  Allergies  Allergen Reactions  . Adhesive (Tape) Rash  . Latex Rash    Per Patient Latex=lip swelling    Family History  Problem Relation Age of Onset  . Heart disease Mother   . Stroke Mother   . Heart disease Father   . Pneumonia Brother     Prior to Admission medications   Medication Sig Start Date End Date Taking? Authorizing Provider  acetaminophen (TYLENOL) 500 MG tablet Take 500 mg by mouth every 6 (six) hours as needed. For pain   Yes Historical Provider, MD  albuterol (PROVENTIL HFA;VENTOLIN HFA) 108 (90 BASE) MCG/ACT inhaler Inhale 2 puffs into the lungs every 4 (four) hours as needed (coughing).   Yes Historical Provider, MD  atorvastatin (LIPITOR) 10 MG tablet Take 10 mg by mouth Daily.    Yes Historical Provider, MD  cefdinir (OMNICEF) 300 MG capsule Take 300 mg by mouth 2 (two) times daily. 10 day course. Filled 04/21/13   Yes Historical Provider, MD  diltiazem (CARDIZEM CD) 240 MG 24 hr capsule Take 240 mg by mouth daily.   Yes Historical Provider, MD  furosemide (LASIX) 20 MG  tablet Take 20 mg by mouth Daily.  08/22/11  Yes Historical Provider, MD  Hydrocodone-Homatropine 5-1.5 MG TABS Take 1 tablet by mouth every 6 (six) hours as needed (cough).   Yes Historical Provider, MD  levothyroxine (SYNTHROID, LEVOTHROID) 50 MCG tablet Take 25 mcg by mouth Daily.    Yes Historical Provider, MD  meclizine (ANTIVERT) 25 MG tablet Take 25 mg by mouth 2 (two) times daily as needed for dizziness.    Yes Historical Provider, MD  Misc Natural Products (OSTEO BI-FLEX JOINT SHIELD PO) Take 1 tablet by mouth 2  (two) times daily.    Yes Historical Provider, MD  Omega-3 Fatty Acids (FISH OIL) 1200 MG CAPS Take 1 capsule by mouth daily.    Yes Historical Provider, MD  polyethylene glycol (MIRALAX / GLYCOLAX) packet Take 17 g by mouth daily as needed. For constipation   Yes Historical Provider, MD  senna (SENOKOT) 8.6 MG TABS Take 1 tablet by mouth daily as needed. For constipation   Yes Historical Provider, MD  vitamin B-12 (CYANOCOBALAMIN) 100 MCG tablet Take 50 mcg by mouth daily.     Yes Historical Provider, MD  vitamin E 400 UNIT capsule Take 400 Units by mouth daily.     Yes Historical Provider, MD  warfarin (COUMADIN) 3 MG tablet Take 1.5-3 mg by mouth every evening. Take one tablet (3 mg) every evening except on Sundays take half-tablet (1.5 mg).   Yes Historical Provider, MD   Physical Exam: Filed Vitals:   04/24/13 1730 04/24/13 1800 04/24/13 1815 04/24/13 1830  BP: 136/76 140/83 143/84 129/79  Pulse: 95 92 98 89  Temp:      TempSrc:      Resp: 20 17 19 19   SpO2: 91% 90% 91% 93%     General:  AAOx3  HEENT: PERRLA, EOMI, no JVD  Oral mucosa moist and pink  Cardiovascular: S1S2/RRR  Respiratory: scattered ronchi more at R base, occasional exp wheezes  Abdomen: soft, Nt,. BS present  Skin: face with hyperpigmented pin pint marks  Ext: no edema c/c  Psychiatric: appropraite mood and affect  Neurologic: non focal  Labs on Admission:  Basic Metabolic Panel:  Recent Labs Lab 04/24/13 1126  NA 136  K 3.8  CL 100  CO2 28  GLUCOSE 122*  BUN 15  CREATININE 0.61  CALCIUM 9.7   Liver Function Tests:  Recent Labs Lab 04/24/13 1126  AST 19  ALT 13  ALKPHOS 125*  BILITOT 1.1  PROT 7.0  ALBUMIN 3.6    Recent Labs Lab 04/24/13 1142  LIPASE 21   No results found for this basename: AMMONIA,  in the last 168 hours CBC:  Recent Labs Lab 04/24/13 1126  WBC 6.0  NEUTROABS 4.9  HGB 14.2  HCT 41.4  MCV 94.3  PLT 158   Cardiac Enzymes: No results found for  this basename: CKTOTAL, CKMB, CKMBINDEX, TROPONINI,  in the last 168 hours  BNP (last 3 results)  Recent Labs  04/24/13 1133  PROBNP 909.9*   CBG: No results found for this basename: GLUCAP,  in the last 168 hours  Radiological Exams on Admission: Dg Chest 2 View  04/24/2013  *RADIOLOGY REPORT*  Clinical Data: Chest pain  CHEST - 2 VIEW  Comparison: April 01, 2012.  Findings: Cardiomediastinal silhouette appears normal. Mild central pulmonary vascular congestion is noted.  No pneumothorax or pleural effusion is noted.  No pneumonia or atelectasis is noted.  Bony thorax is intact.  IMPRESSION: Mild central pulmonary  vascular congestion is noted.   Original Report Authenticated By: Lupita Raider.,  M.D.     EKG:   Assessment/Plan Active Problems:   Atrial fibrillation   Acute bronchitis   Reactive airway disease   1. Acute on Chronic Bronchitis with reactive airway disease: - IV Rocephin and Zithromax - Prednisone 50mg  now and quick taper  - Albuterol/Atrovent nebs - Supportive care -mucinex BID  2. Elevated BNP -Clinically do not suspect CHF - Received a one-time dose of IV Lasix in ER -Last echo 9/13 with EF of 55-60% with grade 1 diastolic dysfunction - resume home dose of PO lasix tomorrow  2. Afib: -rate controlled,  -cut down Cardizem dose to 180mg , due to concern about rash, even though i suspect this is not the cause -coumadin per pharmacy  3. HTN: stable -cardizem at lower dose as noted above  4. Skin rash -per Outpatient Derm  DVT proph: already on coumadin   Code Status: FULL CODE Family Communication: d/w pt and children at bedside Disposition Plan: Home when better  Time spent:  [facility] Pager [phone]  If 7PM-7AM, please contact night-coverage [email or web address] Password [facility] 04/24/2013, 6:57 PM

## 2013-04-24 NOTE — ED Provider Notes (Signed)
79 year old female with a history of atrial fibrillation but no significant history of coronary artery disease, liver failure or chronic abdominal conditions. She does have a history of an episode of pancreatitis in the past, required a cholecystectomy and has had bronchitis several times. She is currently under the care of her physician for what has appear to be a bronchitis. She had an x-ray which was unremarkable and has been taking an antibiotic starting in the last 48 hours. She states that her coughing has been coming worse, she has had associated nausea and vomiting today and feels "really bad".  On exam the patient has a soft abdomen, clear heart and lung sounds, atrial fibrillation is present and strong pulses at the radial arteries. She has unremarkable mucous membranes, no peripheral edema and no other signs of illness.  The patient will undergo metabolic and infectious workup including repeat chest x-ray as well as laboratory data to rule out pancreatitis, hepatitis, kidney dysfunction, urinary tract infection or pneumonia. She does not appear toxic, has a normal mental status as giving a very clear history. Her EKG shows atrial fibrillation, no signs of ischemia.   Medical screening examination/treatment/procedure(s) were conducted as a shared visit with non-physician practitioner(s) and myself.  I personally evaluated the patient during the encounter    Vida Roller, MD 04/24/13 1159

## 2013-04-24 NOTE — ED Notes (Signed)
Vomited upon arrival to er greem/yello bile

## 2013-04-24 NOTE — ED Notes (Signed)
PA at bedside.

## 2013-04-24 NOTE — ED Notes (Signed)
Hospitalist at bedside 

## 2013-04-24 NOTE — ED Provider Notes (Signed)
History     CSN: [id number]  Arrival date & time 04/24/13  1038   First MD Initiated Contact with Patient 04/24/13 1040      No chief complaint on file.   (Consider location/radiation/quality/duration/timing/severity/associated sxs/prior treatment) HPI  [name] is a pleasant 79 year old female who presents to the emergency department with chief complaint of chest pain.  The patient has a past medical history of atrial fibrillation, hypertension, history of colon cancer, and hemochromatosis.  Patient has had a chronic cough for which she has been evaluated outpatient.  Over the past few weeks she has had worsening cough, productive of green colored sputum.  She has had pain with her cough, feeling malaise, chills, weakness, shortness of breath.  She denies any chest pressure or radiation to left arm or jaw, or diaphoresis.  The patient was seen by her primary care physician to get a half ago.  X-ray at that time was negative for any pneumonia she was diagnosed with bronchitis.  2 days ago she went to the urgent care in [place] she was given albuterol, Hycodan, and started on Omnicef.  The patient states that this morning she became very nauseated, she did have some vomiting.  She states that she just feels very sick and very weak and came for evaluation.  Past Medical History  Diagnosis Date  . HTN (hypertension)   . Environmental allergies   . Arthritis   . Thyroid disease   . History of colon cancer   . Hemochromatosis 1998  . Atrial fibrillation   . Constipation   . Arthritis pain   . Cancer   . PONV (postoperative nausea and vomiting)   . Varicose vein of leg     Past Surgical History  Procedure Laterality Date  . Colon surgery  1996    CANCER REMOVAL  . Total hip arthroplasty  2004    RIGHT  . Gallbladder surgery  2005  . Breast surgery  1991    Rt br lump removed-benign  . Liver biopsy  04/08/2012    Procedure: LIVER BIOPSY;  Surgeon: Ernestene Mention, MD;   Location: [facility] OR;  Service: General;;  laparoscopic    Family History  Problem Relation Age of Onset  . Heart disease Mother   . Stroke Mother   . Heart disease Father   . Pneumonia Brother     History  Substance Use Topics  . Smoking status: Never Smoker   . Smokeless tobacco: Not on file  . Alcohol Use: No    OB History   Grav Para Term Preterm Abortions TAB SAB Ect Mult Living                  Review of Systems  Ten systems reviewed and are negative for acute change, except as noted in the HPI.   Allergies  Adhesive and Latex  Home Medications   Current Outpatient Rx  Name  Route  Sig  Dispense  Refill  . acetaminophen (TYLENOL) 500 MG tablet   Oral   Take 500 mg by mouth every 6 (six) hours as needed. For pain         . albuterol (PROVENTIL HFA;VENTOLIN HFA) 108 (90 BASE) MCG/ACT inhaler   Inhalation   Inhale 2 puffs into the lungs every 4 (four) hours as needed (coughing).         Marland Kitchen atorvastatin (LIPITOR) 10 MG tablet   Oral   Take 10 mg by mouth Daily.          Marland Kitchen  cefdinir (OMNICEF) 300 MG capsule   Oral   Take 300 mg by mouth 2 (two) times daily. 10 day course. Filled 04/21/13         . diltiazem (CARDIZEM CD) 240 MG 24 hr capsule   Oral   Take 240 mg by mouth daily.         . furosemide (LASIX) 20 MG tablet   Oral   Take 20 mg by mouth Daily.          . Hydrocodone-Homatropine 5-1.5 MG TABS   Oral   Take 1 tablet by mouth every 6 (six) hours as needed (cough).         Marland Kitchen levothyroxine (SYNTHROID, LEVOTHROID) 50 MCG tablet   Oral   Take 25 mcg by mouth Daily.          . meclizine (ANTIVERT) 25 MG tablet   Oral   Take 25 mg by mouth 2 (two) times daily as needed for dizziness.          . Misc Natural Products (OSTEO BI-FLEX JOINT SHIELD PO)   Oral   Take 1 tablet by mouth 2 (two) times daily.          . Omega-3 Fatty Acids (FISH OIL) 1200 MG CAPS   Oral   Take 1 capsule by mouth daily.          . polyethylene glycol  (MIRALAX / GLYCOLAX) packet   Oral   Take 17 g by mouth daily as needed. For constipation         . senna (SENOKOT) 8.6 MG TABS   Oral   Take 1 tablet by mouth daily as needed. For constipation         . vitamin B-12 (CYANOCOBALAMIN) 100 MCG tablet   Oral   Take 50 mcg by mouth daily.           . vitamin E 400 UNIT capsule   Oral   Take 400 Units by mouth daily.           Marland Kitchen warfarin (COUMADIN) 3 MG tablet   Oral   Take 1.5-3 mg by mouth every evening. Take one tablet (3 mg) every evening except on Sundays take half-tablet (1.5 mg).           BP 147/80  Pulse 90  Temp(Src) 98.3 F (36.8 C) (Oral)  Resp 20  SpO2 97%  Physical Exam  Nursing note and vitals reviewed. Constitutional: She is oriented to person, place, and time. She appears well-developed and well-nourished. No distress.  Well spoken pleasant 79 year old female.  She did not appear to be in acute distress however appears very uncomfortable  HENT:  Head: Normocephalic and atraumatic.  Eyes: Conjunctivae are normal. No scleral icterus.  Neck: Normal range of motion.  Cardiovascular: Normal rate, regular rhythm and normal heart sounds.  Exam reveals no gallop and no friction rub.   No murmur heard. Pulmonary/Chest: Effort normal. No respiratory distress. She has wheezes (mild expiratory wheeze throughout). She has no rales.  Abdominal: Soft. Bowel sounds are normal. She exhibits no distension and no mass. There is no tenderness. There is no guarding.  Neurological: She is alert and oriented to person, place, and time.  Skin: Skin is warm and dry. She is not diaphoretic.  Unusual rash on the face.  Macular brown colored lesions that are non-blanchable.  They are very in shape.     ED Course  Procedures (including critical care time)  Labs Reviewed -  No data to display No results found.   No diagnosis found.    MDM  11:37 AM Filed Vitals:   04/24/13 1048  BP: 147/80  Pulse: 90  Temp: 98.3  F (36.8 C)  Resp: 20   Patient here for complaint of chest pain worse with cough.  I assume this is associated with her bronchitis.  Will evaluate the patient for any acute coronary syndrome or cardiac ischemia.  The patient will also be given a repeat chest x-ray to rule out pneumonia.  The patient appears weak and tired however she did not appear toxic.  Patient is on Lasix and she states that this is from venous stasis the swelling, however due to her history of atrial fibrillation hypertension I will get a probe BNP on the patient.   2:49 PM I have spoken with Trish from [facility] who states that they will come and see the patient here in the ED.   4:14 PM Patient is being admitted by [facility]. I have given the patient IV levaquin to cover for CAP, however patient had a reaction and it was D/c. I have ordered IV benadryl.  Arthor Captain, PA-C 04/24/13 1622

## 2013-04-24 NOTE — ED Provider Notes (Signed)
Medical screening examination/treatment/procedure(s) were conducted as a shared visit with non-physician practitioner(s) and myself.  I personally evaluated the patient during the encounter  Please see my separate respective documentation pertaining to this patient encounter   Amore Ackman D Lyndzie Zentz, MD 04/24/13 2138 

## 2013-04-24 NOTE — ED Notes (Signed)
Cardiology at bedside.

## 2013-04-24 NOTE — Consult Note (Signed)
Patient ID: [name] MRN: [id number], DOB/AGE: 1929-08-26   Admit date: 04/24/2013 Date of Consult: @TODAY @  Primary Physician: Lorenda Peck, MD Primary Cardiologist: Clifton James    Problem List: Past Medical History  Diagnosis Date  . HTN (hypertension)   . Environmental allergies   . Arthritis   . Thyroid disease   . History of colon cancer   . Hemochromatosis 1998  . Atrial fibrillation   . Constipation   . Arthritis pain   . Cancer   . PONV (postoperative nausea and vomiting)   . Varicose vein of leg     Past Surgical History  Procedure Laterality Date  . Colon surgery  1996    CANCER REMOVAL  . Total hip arthroplasty  2004    RIGHT  . Gallbladder surgery  2005  . Breast surgery  1991    Rt br lump removed-benign  . Liver biopsy  04/08/2012    Procedure: LIVER BIOPSY;  Surgeon: Ernestene Mention, MD;  Location: [facility] OR;  Service: General;;  laparoscopic     Allergies:  Allergies  Allergen Reactions  . Adhesive (Tape) Rash  . Latex Rash    Per Patient Latex=lip swelling    HPI: Patient is an 79 yo with a history of intermitt atrial fibrillation, HTN, hemochromatosis, HL, colon Ca.  She is followed by Alyson Ingles in clinic  Last seen in Aug.  She continues to f/u in coumadin clinic.  Patient notes over the past couple weeks the development of cough.  This has worsened nad she is now producing yellow/greenisch sputum.  Notes fatigue, malaise.  Denies CP  Denies palpitations.  Inpatient Medications:  .  morphine injection  4 mg Intravenous Once    Family History  Problem Relation Age of Onset  . Heart disease Mother   . Stroke Mother   . Heart disease Father   . Pneumonia Brother      History   Social History  . Marital Status: Married    Spouse Name: N/A    Number of Children: N/A  . Years of Education: N/A   Occupational History  . RETIRED [employer]   Social History Main Topics  . Smoking status: Never Smoker   . Smokeless tobacco:  Not on file  . Alcohol Use: No  . Drug Use: No  . Sexually Active: Not on file   Other Topics Concern  . Not on file   Social History Narrative  . No narrative on file     Review of Systems:  All other systems reviewed and are otherwise negative except as noted above.  Physical Exam: Filed Vitals:   04/24/13 1515  BP: 130/76  Pulse: 84  Temp:   Resp: 21   No intake or output data in the 24 hours ending 04/24/13 1546  General: Well developed, well nourished, in no acute distress. Head: Normocephalic, atraumatic, sclera non-icteric Neck: Negative for carotid bruits. JVP not elevated. Lungs: Bilateral rhonchi, pops   Mild wheeze Heart: Irreg rate and rhythm. S1, S2.  No S3.  No signif murmursRR with S1 S2. Abdomen: Soft, non-tender, non-distended with normoactive bowel sounds. No hepatomegaly. No rebound/guarding. No obvious abdominal masses. Msk:  Strength and tone appears normal for age. Extremities: No clubbing, cyanosis or edema.  Distal pedal pulses are 2+ and equal bilaterally. Neuro: Alert and oriented X 3. Moves all extremities spontaneously. Psych:  Responds to questions appropriately with a normal affect.  Labs: Results for orders placed during the  hospital encounter of 04/24/13 (from the past 24 hour(s))  CBC WITH DIFFERENTIAL     Status: Abnormal   Collection Time    04/24/13 11:26 AM      Result Value Range   WBC 6.0  4.0 - 10.5 K/uL   RBC 4.39  3.87 - 5.11 MIL/uL   Hemoglobin 14.2  12.0 - 15.0 g/dL   HCT 16.1  09.6 - 04.5 %   MCV 94.3  78.0 - 100.0 fL   MCH 32.3  26.0 - 34.0 pg   MCHC 34.3  30.0 - 36.0 g/dL   RDW 40.9  81.1 - 91.4 %   Platelets 158  150 - 400 K/uL   Neutrophils Relative 82 (*) 43 - 77 %   Neutro Abs 4.9  1.7 - 7.7 K/uL   Lymphocytes Relative 9 (*) 12 - 46 %   Lymphs Abs 0.6 (*) 0.7 - 4.0 K/uL   Monocytes Relative 8  3 - 12 %   Monocytes Absolute 0.5  0.1 - 1.0 K/uL   Eosinophils Relative 0  0 - 5 %   Eosinophils Absolute 0.0  0.0  - 0.7 K/uL   Basophils Relative 0  0 - 1 %   Basophils Absolute 0.0  0.0 - 0.1 K/uL  COMPREHENSIVE METABOLIC PANEL     Status: Abnormal   Collection Time    04/24/13 11:26 AM      Result Value Range   Sodium 136  135 - 145 mEq/L   Potassium 3.8  3.5 - 5.1 mEq/L   Chloride 100  96 - 112 mEq/L   CO2 28  19 - 32 mEq/L   Glucose, Bld 122 (*) 70 - 99 mg/dL   BUN 15  6 - 23 mg/dL   Creatinine, Ser 7.82  0.50 - 1.10 mg/dL   Calcium 9.7  8.4 - 95.6 mg/dL   Total Protein 7.0  6.0 - 8.3 g/dL   Albumin 3.6  3.5 - 5.2 g/dL   AST 19  0 - 37 U/L   ALT 13  0 - 35 U/L   Alkaline Phosphatase 125 (*) 39 - 117 U/L   Total Bilirubin 1.1  0.3 - 1.2 mg/dL   GFR calc non Af Amer 83 (*) >90 mL/min   GFR calc Af Amer >90  >90 mL/min  PRO B NATRIURETIC PEPTIDE     Status: Abnormal   Collection Time    04/24/13 11:33 AM      Result Value Range   Pro B Natriuretic peptide (BNP) 909.9 (*) 0 - 450 pg/mL  LIPASE, BLOOD     Status: None   Collection Time    04/24/13 11:42 AM      Result Value Range   Lipase 21  11 - 59 U/L  POCT I-STAT TROPONIN I     Status: None   Collection Time    04/24/13 11:49 AM      Result Value Range   Troponin i, poc 0.00  0.00 - 0.08 ng/mL   Comment 3           URINALYSIS, ROUTINE W REFLEX MICROSCOPIC     Status: Abnormal   Collection Time    04/24/13 12:44 PM      Result Value Range   Color, Urine YELLOW  YELLOW   APPearance CLOUDY (*) CLEAR   Specific Gravity, Urine 1.019  1.005 - 1.030   pH 6.5  5.0 - 8.0   Glucose, UA 100 (*) NEGATIVE mg/dL  Hgb urine dipstick LARGE (*) NEGATIVE   Bilirubin Urine NEGATIVE  NEGATIVE   Ketones, ur NEGATIVE  NEGATIVE mg/dL   Protein, ur NEGATIVE  NEGATIVE mg/dL   Urobilinogen, UA 2.0 (*) 0.0 - 1.0 mg/dL   Nitrite NEGATIVE  NEGATIVE   Leukocytes, UA NEGATIVE  NEGATIVE  URINE MICROSCOPIC-ADD ON     Status: None   Collection Time    04/24/13 12:44 PM      Result Value Range   Squamous Epithelial / LPF RARE  RARE   WBC, UA 0-2  <3  WBC/hpf   RBC / HPF 11-20  <3 RBC/hpf   Bacteria, UA RARE  RARE    Radiology/Studies: Dg Chest 2 View  04/24/2013  *RADIOLOGY REPORT*  Clinical Data: Chest pain  CHEST - 2 VIEW  Comparison: April 01, 2012.  Findings: Cardiomediastinal silhouette appears normal. Mild central pulmonary vascular congestion is noted.  No pneumothorax or pleural effusion is noted.  No pneumonia or atelectasis is noted.  Bony thorax is intact.  IMPRESSION: Mild central pulmonary vascular congestion is noted.   Original Report Authenticated By: Lupita Raider.,  M.D.    Dg Chest 2 View  04/07/2013  *RADIOLOGY REPORT*  Clinical Data: Cough.  CHEST - 2 VIEW  Comparison: April 01, 2012.  Findings: Cardiomediastinal silhouette appears normal.  No acute pulmonary disease is noted.  Bony thorax is intact.  IMPRESSION: No acute cardiopulmonary abnormality seen.   Original Report Authenticated By: Lupita Raider.,  M.D.     EKG:  Atrial fibrillation  88 bpm.   ASSESSMENT AND PLAN:   Patient an 79 yo  With history of HTN, atrial fibrillation (on coumadin) who presents with a 2 wk history of cough  Now productive of yellow/greenish sputum. She also has general malaise.  Denies CP  No palpitations Exam signif for Sat of 93% on RA.  Lungs with rhonchi, pops, wheezes.   Labs signfi for mild elevation of BNP at 909  CXR with some central vascular congestion.  No pneumonia or frank CHF.  My impression is the patient has had a worsening bronchitis.  I am not convinced that this is primarily ischemia or CHF, though she has some congestion.  I would recomm she be admitted to the medicine service for treatment of URI.  We will follow for monitoring of afib.  Note she had reaction to Levaquin with rash  Will need to switch  Would give 1 dose of IV lasix and follow.  2.  HTN  Follow  3.  Hemachromatosis     Signed, Dietrich Pates 04/24/2013, 3:46 PM

## 2013-04-24 NOTE — ED Notes (Signed)
abd pain  W/ nausea  Started this am feels warm to touch  Has been coughing a lot saw her dr on wed and dx w/ bronchititis

## 2013-04-24 NOTE — Progress Notes (Signed)
ANTICOAGULATION CONSULT NOTE - Initial Consult  Pharmacy Consult for Coumadin Indication: atrial fibrillation  Allergies  Allergen Reactions  . Adhesive (Tape) Rash  . Latex Rash    Per Patient Latex=lip swelling    Patient Measurements: Height: 5\' 3"  (160 cm) Weight: 179 lb 7.3 oz (81.4 kg) IBW/kg (Calculated) : 52.4  Vital Signs: Temp: 98.6 F (37 C) (04/25 1945) Temp src: Oral (04/25 1945) BP: 150/87 mmHg (04/25 1945) Pulse Rate: 94 (04/25 1945)  Labs:  Recent Labs  04/24/13 1126 04/24/13 2150  HGB 14.2  --   HCT 41.4  --   PLT 158  --   LABPROT  --  18.8*  INR  --  1.63*  CREATININE 0.61  --     Estimated Creatinine Clearance: 55.7 ml/min (by C-G formula based on Cr of 0.61).   Medical History: Past Medical History  Diagnosis Date  . HTN (hypertension)   . Environmental allergies   . Arthritis   . Thyroid disease   . History of colon cancer   . Hemochromatosis 1998  . Atrial fibrillation   . Constipation   . Arthritis pain   . Cancer   . PONV (postoperative nausea and vomiting)   . Varicose vein of leg     Medications:  Prescriptions prior to admission  Medication Sig Dispense Refill  . acetaminophen (TYLENOL) 500 MG tablet Take 500 mg by mouth every 6 (six) hours as needed. For pain      . albuterol (PROVENTIL HFA;VENTOLIN HFA) 108 (90 BASE) MCG/ACT inhaler Inhale 2 puffs into the lungs every 4 (four) hours as needed (coughing).      Marland Kitchen atorvastatin (LIPITOR) 10 MG tablet Take 10 mg by mouth Daily.       . cefdinir (OMNICEF) 300 MG capsule Take 300 mg by mouth 2 (two) times daily. 10 day course. Filled 04/21/13      . diltiazem (CARDIZEM CD) 240 MG 24 hr capsule Take 240 mg by mouth daily.      . furosemide (LASIX) 20 MG tablet Take 20 mg by mouth Daily.       . Hydrocodone-Homatropine 5-1.5 MG TABS Take 1 tablet by mouth every 6 (six) hours as needed (cough).      Marland Kitchen levothyroxine (SYNTHROID, LEVOTHROID) 50 MCG tablet Take 25 mcg by mouth Daily.        . meclizine (ANTIVERT) 25 MG tablet Take 25 mg by mouth 2 (two) times daily as needed for dizziness.       . Misc Natural Products (OSTEO BI-FLEX JOINT SHIELD PO) Take 1 tablet by mouth 2 (two) times daily.       . Omega-3 Fatty Acids (FISH OIL) 1200 MG CAPS Take 1 capsule by mouth daily.       . polyethylene glycol (MIRALAX / GLYCOLAX) packet Take 17 g by mouth daily as needed. For constipation      . senna (SENOKOT) 8.6 MG TABS Take 1 tablet by mouth daily as needed. For constipation      . vitamin B-12 (CYANOCOBALAMIN) 100 MCG tablet Take 50 mcg by mouth daily.        . vitamin E 400 UNIT capsule Take 400 Units by mouth daily.        Marland Kitchen warfarin (COUMADIN) 3 MG tablet Take 1.5-3 mg by mouth every evening. Take one tablet (3 mg) every evening except on Sundays take half-tablet (1.5 mg).       Scheduled:  . [COMPLETED] sodium chloride   Intravenous  Once  . [START ON 04/25/2013] albuterol  2.5 mg Nebulization BID  . [START ON 04/25/2013] amLODipine  2.5 mg Oral Daily  . atorvastatin  10 mg Oral q1800  . azithromycin  500 mg Intravenous Q24H  . cefTRIAXone (ROCEPHIN)  IV  1 g Intravenous Q24H  . [START ON 04/25/2013] diltiazem  120 mg Oral Daily  . [COMPLETED] furosemide  20 mg Intravenous Once  . guaiFENesin  600 mg Oral BID  . levothyroxine  25 mcg Oral QAC breakfast  . [COMPLETED] ondansetron (ZOFRAN) IV  4 mg Intravenous Once  . predniSONE  50 mg Oral Q breakfast  . warfarin  5 mg Oral NOW  . [START ON 04/25/2013] Warfarin - Pharmacist Dosing Inpatient   Does not apply q1800  . [DISCONTINUED] albuterol  2.5 mg Nebulization Q6H  . [DISCONTINUED] diltiazem  180 mg Oral Daily  . [DISCONTINUED] diphenhydrAMINE  25 mg Intravenous Once  . [COMPLETED] levofloxacin (LEVAQUIN) IV  500 mg Intravenous Once  . [DISCONTINUED]  morphine injection  4 mg Intravenous Once  . [DISCONTINUED] prednisoLONE  50 mg Oral Daily    Assessment: 79yo female recently dx'd w/ bronchitis now c/o abdominal pain  with N/V, admitted for acute-on-chronic bronchitis, to continue Coumadin for Afib; admitted with subtherapeutic INR.  Goal of Therapy:  INR 2-3   Plan:  Will give boosted Coumadin dose of 5mg  po x1 now and monitor INR for adjustments.  Vernard Gambles, PharmD, BCPS  04/24/2013,11:18 PM

## 2013-04-24 NOTE — Progress Notes (Signed)
Spoke with patient and daughter.   She has had a rash since increasing dose of diltilazem   The request cutting back on dilt and going back to medicine she had last summer  This was amlodipine 2.5 mg Will go ahead and make change  Follow HR and BP.

## 2013-04-25 LAB — PROTIME-INR: INR: 1.76 — ABNORMAL HIGH (ref 0.00–1.49)

## 2013-04-25 LAB — BASIC METABOLIC PANEL
CO2: 27 mEq/L (ref 19–32)
Calcium: 9.8 mg/dL (ref 8.4–10.5)
Chloride: 99 mEq/L (ref 96–112)
Creatinine, Ser: 0.69 mg/dL (ref 0.50–1.10)
Glucose, Bld: 147 mg/dL — ABNORMAL HIGH (ref 70–99)
Sodium: 136 mEq/L (ref 135–145)

## 2013-04-25 LAB — CBC
Hemoglobin: 14.4 g/dL (ref 12.0–15.0)
MCH: 31.8 pg (ref 26.0–34.0)
MCV: 93.6 fL (ref 78.0–100.0)
RBC: 4.53 MIL/uL (ref 3.87–5.11)
WBC: 4.9 10*3/uL (ref 4.0–10.5)

## 2013-04-25 LAB — MRSA PCR SCREENING: MRSA by PCR: NEGATIVE

## 2013-04-25 MED ORDER — WARFARIN SODIUM 3 MG PO TABS
3.0000 mg | ORAL_TABLET | Freq: Once | ORAL | Status: AC
  Administered 2013-04-25: 3 mg via ORAL
  Filled 2013-04-25: qty 1

## 2013-04-25 MED ORDER — BACITRACIN ZINC 500 UNIT/GM EX OINT
TOPICAL_OINTMENT | Freq: Two times a day (BID) | CUTANEOUS | Status: DC
  Administered 2013-04-25 – 2013-04-27 (×4): via TOPICAL
  Filled 2013-04-25: qty 15

## 2013-04-25 MED ORDER — METOPROLOL TARTRATE 25 MG PO TABS
25.0000 mg | ORAL_TABLET | Freq: Two times a day (BID) | ORAL | Status: DC
  Administered 2013-04-25 (×2): 25 mg via ORAL
  Filled 2013-04-25 (×4): qty 1

## 2013-04-25 NOTE — Progress Notes (Signed)
ANTICOAGULATION CONSULT NOTE - Follow-up  Pharmacy Consult for Coumadin Indication: atrial fibrillation  Allergies  Allergen Reactions  . Adhesive (Tape) Rash  . Latex Rash    Per Patient Latex=lip swelling    Patient Measurements: Height: 5\' 3"  (160 cm) Weight: 179 lb 7.3 oz (81.4 kg) IBW/kg (Calculated) : 52.4  Vital Signs: Temp: 97.9 F (36.6 C) (04/26 0433) Temp src: Oral (04/26 0433) BP: 119/75 mmHg (04/26 0433) Pulse Rate: 90 (04/26 0433)  Labs:  Recent Labs  04/24/13 1126 04/24/13 2150 04/25/13 0450  HGB 14.2  --  14.4  HCT 41.4  --  42.4  PLT 158  --  167  LABPROT  --  18.8* 19.9*  INR  --  1.63* 1.76*  CREATININE 0.61  --  0.69    Estimated Creatinine Clearance: 55.7 ml/min (by C-G formula based on Cr of 0.69).  Assessment: 79yo female recently dx'd w/ bronchitis now c/o abdominal pain with N/V, admitted for acute-on-chronic bronchitis  Continues on coumadin for Afib. INR remains subtherapeutic at 1.76, CBC is WNL, no bleeding noted.  Goal of Therapy:  INR 2-3   Plan:  1. Coumadin 3mg  PO x 1 tonight 2. F/u AM INR  Lysle Pearl, PharmD, BCPS Pager # [phone] 04/25/2013 10:10 AM

## 2013-04-25 NOTE — Progress Notes (Signed)
Patient ID: [name], female   DOB: 06-27-1929, 79 y.o.   MRN: [id number] Heart rate currently in the 140s. She is not wheezing. Blood pressure will allow the addition of beta blocker. We'll discontinue amlodipine. Continue low-dose diltiazem.

## 2013-04-25 NOTE — Progress Notes (Signed)
Pt informed me that she has a hx MRSA nares and presently has a lesion by her rt ear. Dr Criselda Peaches made aware-cultures sent.

## 2013-04-25 NOTE — Progress Notes (Signed)
Patient ID: [name], female   DOB: [birth year], 79 y.o.   MRN: [id number] Chart reviewed, patient examined. Remains in PAF with a well controlled rate. Continue diltiazem XR 120mg  q day. Not sure we should continue amlodopine for now. Will schedule follow up in our office. Will sign off. OK for discharge.

## 2013-04-25 NOTE — Progress Notes (Addendum)
[facility]  Progress Note   Brief Narrative [name] is an 79yo woman with PMH of pAfib on coumadin and HTN who presented with worsening cough/URI symptoms and is being treated for acute on chronic bronchitis.  She had an elevated BNP, cards is following.   Assessment/Plan: Acute on Chronic bronchitis - Switch to PO Abx today - Today will be day 2 of Abx, can likely do well with 5 days for this issue, no pneumonia noted on CXR, only pulmonary vascular congestion - She is improved this AM, but continues to have coughing fits with SOB - Continue breathing treatments - Continue steroids with quick (5 day) course  Atrial fibrillation - Cardiology following - Continues to have elevated HR while coughing and getting albuterol - Add beta blocker, metoprolol started - Ok for discharge from Cards standpoint.  - Continue diltiazem, coumadin  Elevated BNP - Clinically her spectrum of symptoms is not likely to be from CHF, however, she did have good urine output with lasix - Continue home dose of lasix  HTN - Added metoprolol, monitor blood pressure  Microscopic hematuria - She has had this before, but not as much as UA from 4/25 - Possibly due to coumadin, H/H okay - [name] told me that her mother (who was on coumadin) also had this problem.  - Work up as an outpatient unless clinical change  DVT PPx: On coumadin  Code: Full Family communication: Patient only today Disposition: Home tomorrow if continues to do well.     Subjective: [name] reports that she is feeling better today, coughing improved. However, when she coughs she continues to get very SOB and her HR increases.    Objective: Vital signs in last 24 hours: Temp:  [97.9 F (36.6 C)-98.6 F (37 C)] 97.9 F (36.6 C) (04/26 0433) Pulse Rate:  [68-101] 90 (04/26 0433) Resp:  [14-24] 18 (04/26 0433) BP: (119-150)/(68-87) 119/75 mmHg (04/26 0433) SpO2:  [90 %-97 %] 93 % (04/26 0433) Weight:  [179 lb 7.3  oz (81.4 kg)] 179 lb 7.3 oz (81.4 kg) (04/25 1945) Weight change:  Last BM Date: 04/24/13  Intake/Output from previous day: 04/25 0701 - 04/26 0700 In: 300 [IV Piggyback:300] Out: 200 [Urine:200] Intake/Output this shift:    General appearance: alert, cooperative, appears stated age and no distress Head: Normocephalic, without obvious abnormality, atraumatic Eyes: EOMI, anicteric sclerae Resp: clear to auscultation bilaterally and some mild wheezing in the mid lung fields Cardio: irreg irreg, rate normal GI: soft, NT, ND, +BS Extremities: thin, no edema, thin skin Pulses: 2+ and symmetric  Lab Results:  Recent Labs  04/24/13 1126 04/25/13 0450  WBC 6.0 4.9  HGB 14.2 14.4  HCT 41.4 42.4  PLT 158 167   BMET  Recent Labs  04/24/13 1126 04/25/13 0450  NA 136 136  K 3.8 3.8  CL 100 99  CO2 28 27  GLUCOSE 122* 147*  BUN 15 16  CREATININE 0.61 0.69  CALCIUM 9.7 9.8    Studies/Results: Dg Chest 2 View  04/24/2013  *RADIOLOGY REPORT*  Clinical Data: Chest pain  CHEST - 2 VIEW  Comparison: April 01, 2012.  Findings: Cardiomediastinal silhouette appears normal. Mild central pulmonary vascular congestion is noted.  No pneumothorax or pleural effusion is noted.  No pneumonia or atelectasis is noted.  Bony thorax is intact.  IMPRESSION: Mild central pulmonary vascular congestion is noted.   Original Report Authenticated By: Lupita Raider.,  M.D.     Medications: I have  reviewed the patient's current medications.    LOS: 1 day   [name] 04/25/2013, 7:27 AM

## 2013-04-25 NOTE — Progress Notes (Signed)
Pt begun on metoprolol  this am for hr increasing to 140's. Pt now able to ambulate with hr staying in the 90's

## 2013-04-26 DIAGNOSIS — J209 Acute bronchitis, unspecified: Principal | ICD-10-CM

## 2013-04-26 DIAGNOSIS — Z7901 Long term (current) use of anticoagulants: Secondary | ICD-10-CM

## 2013-04-26 LAB — PROTIME-INR: Prothrombin Time: 25.9 seconds — ABNORMAL HIGH (ref 11.6–15.2)

## 2013-04-26 MED ORDER — DILTIAZEM HCL ER COATED BEADS 240 MG PO CP24
240.0000 mg | ORAL_CAPSULE | Freq: Every day | ORAL | Status: DC
  Administered 2013-04-26 – 2013-04-27 (×2): 240 mg via ORAL
  Filled 2013-04-26 (×2): qty 1

## 2013-04-26 MED ORDER — BENZONATATE 100 MG PO CAPS
200.0000 mg | ORAL_CAPSULE | Freq: Three times a day (TID) | ORAL | Status: DC
  Administered 2013-04-26 – 2013-04-27 (×3): 200 mg via ORAL
  Filled 2013-04-26 (×5): qty 2

## 2013-04-26 MED ORDER — WARFARIN SODIUM 1 MG PO TABS
1.5000 mg | ORAL_TABLET | Freq: Once | ORAL | Status: AC
  Administered 2013-04-26: 1.5 mg via ORAL
  Filled 2013-04-26: qty 1

## 2013-04-26 NOTE — Progress Notes (Signed)
ANTICOAGULATION CONSULT NOTE - Follow-up  Pharmacy Consult for Coumadin Indication: atrial fibrillation  Allergies  Allergen Reactions  . Adhesive (Tape) Rash  . Latex Rash    Per Patient Latex=lip swelling    Patient Measurements: Height: 5\' 3"  (160 cm) Weight: 179 lb 7.3 oz (81.4 kg) IBW/kg (Calculated) : 52.4  Vital Signs: Temp: 97.5 F (36.4 C) (04/27 0519) Temp src: Oral (04/27 0519) BP: 113/76 mmHg (04/27 0519) Pulse Rate: 74 (04/27 0519)  Labs:  Recent Labs  04/24/13 1126 04/24/13 2150 04/25/13 0450 04/26/13 0434  HGB 14.2  --  14.4  --   HCT 41.4  --  42.4  --   PLT 158  --  167  --   LABPROT  --  18.8* 19.9* 25.9*  INR  --  1.63* 1.76* 2.51*  CREATININE 0.61  --  0.69  --     Estimated Creatinine Clearance: 55.7 ml/min (by C-G formula based on Cr of 0.69).  Assessment: 79yo female recently dx'd w/ bronchitis now c/o abdominal pain with N/V, admitted for acute-on-chronic bronchitis  Continues on coumadin for Afib. INR is now therapeutic at 2.51 but after a pretty large increase (likely from boosted dose of 5mg  on 4/25). No new CBC today, no bleeding noted  Goal of Therapy:  INR 2-3   Plan:  1. Coumadin 1.5mg  PO x 1 tonight (50% dose reduction due to her jump in INR and this is also her normal home dose) 2. F/u AM INR  Lysle Pearl, PharmD, BCPS Pager # [phone] 04/26/2013 10:25 AM

## 2013-04-26 NOTE — Progress Notes (Signed)
[facility] PROGRESS NOTE  [name] ZOX:[id number] DOB: January 15, 1929 DOA: 04/24/2013 PCP: Lorenda Peck, MD  Assessment/Plan: Active Problems:   Atrial fibrillation   Acute bronchitis   Reactive airway disease    1. Acute on Chronic bronchitis:  Patient presented with  2-3 days of increased  Coughing, with production of thick, yellow phlegm, associated with wheezing. CXR demonstrated only pulmonary vascular congestion, but nio pneumonic consolidation. Managing with bronchodilators, antibiotics, steroids and mucolytics. Clinically, she has markedly im[proved, she has remained afebrile, and phlegm is now clear/whitish. Wcc is normal Continue current treatment.   2. Atrial fibrillation: Patient has known PAF, on chronic anticoagulation. She has had episodes or rapid HR, during this hospitalization, possibly exacerbated by Albuterol therapy. Dr Dietrich Pates [provided initial cardilology consultation, and patient was subsequently seen by Dr Valera Castle. Attempt to utilize beta-blocker on 04/25/13, was poorly tolerated by patient. She was changed to Cardizem CD on 04/25/13, and appears to be tolerating it well.  3. Elevated BNP:  ProBNP was elevated at 909.9 on 04/24/13. Clinically, she does not appear to have overt decompensation. Managing with pre-admission diuretics. Stable.  4. HTN: Reasonably controlled at this time.    5. Microscopic hematuria:  Evident on U/A from 04/24/13. Possibly due to Coumadin, HB/HCT normal. Defer follow up and possible urology evaluation, to PMD.    Code Status: Full Code.  Family Communication: Discussed in detail, with family at bedside.  Disposition Plan: Aiming discharge on 4/28/`4.    Brief narrative: 79yo woman with PMH of PAF on coumadin and HTN who presented with worsening cough/URI symptoms and is being treated for acute on chronic bronchitis. She had an elevated BNP.    Consultants:  Dr Dietrich Pates, cardiologist.   Procedures:  CXR.    Antibiotics:  Rocephin/Azithromycin 04/24/13>>>  HPI/Subjective: Feels better, phlegm is clear. Has troublesome cough.   Objective: Vital signs in last 24 hours: Temp:  [97.5 F (36.4 C)-98.2 F (36.8 C)] 97.5 F (36.4 C) (04/27 0519) Pulse Rate:  [74-96] 74 (04/27 0519) Resp:  [16-18] 18 (04/27 0519) BP: (113-121)/(64-76) 113/76 mmHg (04/27 0519) SpO2:  [95 %-96 %] 95 % (04/27 0519) Weight change:  Last BM Date: 04/24/13  Intake/Output from previous day: 04/26 0701 - 04/27 0700 In: 240 [P.O.:240] Out: 300 [Urine:300]     Physical Exam: General: Comfortable, alert, communicative, fully oriented, not short of breath at rest.  HEENT:  No clinical pallor, no jaundice, no conjunctival injection or discharge. NECK:  Supple, JVP not seen, no carotid bruits, no palpable lymphadenopathy, no palpable goiter. CHEST:  Clinically clear to auscultation, no wheezes, no crackles. HEART:  Sounds 1 and 2 heard, normal, irregular, no murmurs. ABDOMEN:  Full, soft, non-tender, no palpable organomegaly, no palpable masses, normal bowel sounds. GENITALIA:  Not examined. LOWER EXTREMITIES:  No pitting edema, palpable peripheral pulses. MUSCULOSKELETAL SYSTEM:  Generalized osteoarthritic changes, otherwise, normal. CENTRAL NERVOUS SYSTEM:  No focal neurologic deficit on gross examination.  Lab Results:  Recent Labs  04/24/13 1126 04/25/13 0450  WBC 6.0 4.9  HGB 14.2 14.4  HCT 41.4 42.4  PLT 158 167    Recent Labs  04/24/13 1126 04/25/13 0450  NA 136 136  K 3.8 3.8  CL 100 99  CO2 28 27  GLUCOSE 122* 147*  BUN 15 16  CREATININE 0.61 0.69  CALCIUM 9.7 9.8   Recent Results (from the past 240 hour(s))  MRSA PCR SCREENING     Status: None   Collection Time  04/25/13  1:45 PM      Result Value Range Status   MRSA by PCR NEGATIVE  NEGATIVE Final   Comment:            The GeneXpert MRSA Assay (FDA     approved for NASAL specimens     only), is one component of a      comprehensive MRSA colonization     surveillance program. It is not     intended to diagnose MRSA     infection nor to guide or     monitor treatment for     MRSA infections.  WOUND CULTURE     Status: None   Collection Time    04/25/13  1:49 PM      Result Value Range Status   Specimen Description WOUND RIGHT EAR   Final   Special Requests NONE   Final   Gram Stain     Final   Value: NO WBC SEEN     NO SQUAMOUS EPITHELIAL CELLS SEEN     NO ORGANISMS SEEN   Culture NO GROWTH 1 DAY   Final   Report Status PENDING   Incomplete     Studies/Results: No results found.  Medications: Scheduled Meds: . albuterol  2.5 mg Nebulization BID  . atorvastatin  10 mg Oral q1800  . azithromycin  500 mg Intravenous Q24H  . bacitracin   Topical BID  . cefTRIAXone (ROCEPHIN)  IV  1 g Intravenous Q24H  . diltiazem  240 mg Oral Daily  . guaiFENesin  600 mg Oral BID  . levothyroxine  25 mcg Oral QAC breakfast  . predniSONE  50 mg Oral Q breakfast  . warfarin  1.5 mg Oral ONCE-1800  . Warfarin - Pharmacist Dosing Inpatient   Does not apply q1800   Continuous Infusions:  PRN Meds:.acetaminophen, benzonatate, levalbuterol, ondansetron (ZOFRAN) IV, polyethylene glycol    LOS: 2 days   [name],CHRISTOPHER  [facility] Pager [phone]. If 8PM-8AM, please contact night-coverage at [email or web address], password [email or web address] 04/26/2013, 1:34 PM  LOS: 2 days

## 2013-04-26 NOTE — Progress Notes (Signed)
Patient ID: [name], female   DOB: 05/18/1929, 79 y.o.   MRN: [id number]   Patient Name: [name] Date of Encounter: 04/26/2013    SUBJECTIVE  Slept poorly secondary wheezing and coughing mostly at night. Is clearly a reaction to adding metoprolol for rate control yesterday. No chest pain except for soreness from coughing. Heart rate in the 70s this morning. Remains in A. fib. INR is therapeutic  CURRENT MEDS . albuterol  2.5 mg Nebulization BID  . atorvastatin  10 mg Oral q1800  . azithromycin  500 mg Intravenous Q24H  . bacitracin   Topical BID  . cefTRIAXone (ROCEPHIN)  IV  1 g Intravenous Q24H  . diltiazem  120 mg Oral Daily  . guaiFENesin  600 mg Oral BID  . levothyroxine  25 mcg Oral QAC breakfast  . metoprolol tartrate  25 mg Oral BID  . predniSONE  50 mg Oral Q breakfast  . Warfarin - Pharmacist Dosing Inpatient   Does not apply q1800    OBJECTIVE  Filed Vitals:   04/25/13 1326 04/25/13 1931 04/25/13 2000 04/26/13 0519  BP: 117/67  121/64 113/76  Pulse: 90 84 96 74  Temp: 98.3 F (36.8 C)  98.2 F (36.8 C) 97.5 F (36.4 C)  TempSrc: Oral  Oral Oral  Resp: 16 16 18 18   Height:      Weight:      SpO2: 95%  96% 95%    Intake/Output Summary (Last 24 hours) at 04/26/13 0803 Last data filed at 04/25/13 1300  Gross per 24 hour  Intake    240 ml  Output    300 ml  Net    -60 ml   Filed Weights   04/24/13 1945  Weight: 179 lb 7.3 oz (81.4 kg)    PHYSICAL EXAM  General: Pleasant, NAD. Neuro: Alert and oriented X 3. Moves all extremities spontaneously. Psych: Normal affect. HEENT:  Normal  Neck: Supple without bruits or JVD. Lungs:  Resp regular and unlabored, CTA. Heart: Irregular rate and rhythm no s3, s4, or murmurs. Abdomen: Soft, non-tender, non-distended, BS + x 4.  Extremities: No clubbing, cyanosis or edema. DP/PT/Radials 2+ and equal bilaterally.  Accessory Clinical Findings  CBC  Recent Labs  04/24/13 1126 04/25/13 0450  WBC 6.0  4.9  NEUTROABS 4.9  --   HGB 14.2 14.4  HCT 41.4 42.4  MCV 94.3 93.6  PLT 158 167   Basic Metabolic Panel  Recent Labs  04/24/13 1126 04/25/13 0450  NA 136 136  K 3.8 3.8  CL 100 99  CO2 28 27  GLUCOSE 122* 147*  BUN 15 16  CREATININE 0.61 0.69  CALCIUM 9.7 9.8   Liver Function Tests  Recent Labs  04/24/13 1126  AST 19  ALT 13  ALKPHOS 125*  BILITOT 1.1  PROT 7.0  ALBUMIN 3.6    Recent Labs  04/24/13 1142  LIPASE 21   Cardiac Enzymes No results found for this basename: CKTOTAL, CKMB, CKMBINDEX, TROPONINI,  in the last 72 hours BNP No components found with this basename: POCBNP,  D-Dimer No results found for this basename: DDIMER,  in the last 72 hours Hemoglobin A1C No results found for this basename: HGBA1C,  in the last 72 hours Fasting Lipid Panel No results found for this basename: CHOL, HDL, LDLCALC, TRIG, CHOLHDL, LDLDIRECT,  in the last 72 hours Thyroid Function Tests No results found for this basename: TSH, T4TOTAL, FREET3, T3FREE, THYROIDAB,  in the last 72  hours  TELE  A. fib with a well-controlled rate  ECG    Radiology/Studies  Dg Chest 2 View  04/24/2013  *RADIOLOGY REPORT*  Clinical Data: Chest pain  CHEST - 2 VIEW  Comparison: April 01, 2012.  Findings: Cardiomediastinal silhouette appears normal. Mild central pulmonary vascular congestion is noted.  No pneumothorax or pleural effusion is noted.  No pneumonia or atelectasis is noted.  Bony thorax is intact.  IMPRESSION: Mild central pulmonary vascular congestion is noted.   Original Report Authenticated By: Lupita Raider.,  M.D.    Dg Chest 2 View  04/07/2013  *RADIOLOGY REPORT*  Clinical Data: Cough.  CHEST - 2 VIEW  Comparison: April 01, 2012.  Findings: Cardiomediastinal silhouette appears normal.  No acute pulmonary disease is noted.  Bony thorax is intact.  IMPRESSION: No acute cardiopulmonary abnormality seen.   Original Report Authenticated By: Lupita Raider.,  M.D.      ASSESSMENT AND PLAN  Active Problems:   Atrial fibrillation   Acute bronchitis   Reactive airway disease    She is she will not tolerate a beta blocker. The rash from medication is suspect at best. It does not sound to drug reaction and the dermatologist told her he did know what it was. We'll discontinue metoprolol and increase diltiazem to 240 mg. Note that amlodipine has been discontinued since this would be to calcium channel blockers. Hopefully this will clear her cough and wheezing and allow her to go home tomorrow.  Signed, Valera Castle MD

## 2013-04-27 LAB — BASIC METABOLIC PANEL
CO2: 29 mEq/L (ref 19–32)
Chloride: 104 mEq/L (ref 96–112)
Potassium: 4 mEq/L (ref 3.5–5.1)
Sodium: 139 mEq/L (ref 135–145)

## 2013-04-27 LAB — CBC
Platelets: 169 10*3/uL (ref 150–400)
RBC: 4.54 MIL/uL (ref 3.87–5.11)
WBC: 6.9 10*3/uL (ref 4.0–10.5)

## 2013-04-27 LAB — PROTIME-INR: Prothrombin Time: 24.6 seconds — ABNORMAL HIGH (ref 11.6–15.2)

## 2013-04-27 LAB — WOUND CULTURE: Gram Stain: NONE SEEN

## 2013-04-27 MED ORDER — PREDNISONE 10 MG PO TABS: ORAL_TABLET | ORAL | Status: DC

## 2013-04-27 MED ORDER — BENZONATATE 200 MG PO CAPS: 200.0000 mg | ORAL_CAPSULE | Freq: Three times a day (TID) | ORAL | Status: DC

## 2013-04-27 MED ORDER — LEVOFLOXACIN 500 MG PO TABS
500.0000 mg | ORAL_TABLET | Freq: Every day | ORAL | Status: DC
  Administered 2013-04-27: 500 mg via ORAL
  Filled 2013-04-27: qty 1

## 2013-04-27 MED ORDER — LEVOFLOXACIN 500 MG PO TABS: 500.0000 mg | ORAL_TABLET | Freq: Every day | ORAL | Status: DC

## 2013-04-27 MED ORDER — BACITRACIN ZINC 500 UNIT/GM EX OINT: TOPICAL_OINTMENT | Freq: Two times a day (BID) | CUTANEOUS | Status: DC

## 2013-04-27 MED ORDER — GUAIFENESIN ER 600 MG PO TB12: 600.0000 mg | ORAL_TABLET | Freq: Two times a day (BID) | ORAL | Status: DC

## 2013-04-27 MED ORDER — WARFARIN SODIUM 3 MG PO TABS
3.0000 mg | ORAL_TABLET | Freq: Once | ORAL | Status: DC
  Filled 2013-04-27: qty 1

## 2013-04-27 NOTE — Progress Notes (Signed)
Utilization Review Completed.[name] T4/28/2014  

## 2013-04-27 NOTE — Progress Notes (Signed)
ANTICOAGULATION CONSULT NOTE - Follow-up  Pharmacy Consult for Coumadin Indication: atrial fibrillation  Allergies  Allergen Reactions  . Adhesive (Tape) Rash  . Latex Rash    Per Patient Latex=lip swelling    Patient Measurements: Height: 5\' 3"  (160 cm) Weight: 179 lb 7.3 oz (81.4 kg) IBW/kg (Calculated) : 52.4  Vital Signs: Temp: 97.6 F (36.4 C) (04/28 0402) Temp src: Oral (04/28 0402) BP: 107/72 mmHg (04/28 0402) Pulse Rate: 79 (04/28 0402)  Labs:  Recent Labs  04/24/13 1126  04/25/13 0450 04/26/13 0434 04/27/13 0605  HGB 14.2  --  14.4  --  14.3  HCT 41.4  --  42.4  --  42.5  PLT 158  --  167  --  169  LABPROT  --   < > 19.9* 25.9* 24.6*  INR  --   < > 1.76* 2.51* 2.34*  CREATININE 0.61  --  0.69  --  0.71  < > = values in this interval not displayed.  Estimated Creatinine Clearance: 55.7 ml/min (by C-G formula based on Cr of 0.71).  Assessment: INR = 2.34, therapeutic in this 79yo female continuing on chronic coumadin for known PAF. CBC stable with PLTC 169K. Recently admitted for  acute-on-chronic bronchitis. No bleeding note.  Currently on oral levofloxacin.   PTA dose of coumadin was 3mg  daily except 1.5 mg every Sunday.  Recently started an antibiotic, Cefdinir PTA on 04/21/13.  Admit INR was 1.63 on admit 4/25.   Goal of Therapy:  INR 2-3   Plan:  1. Coumadin 3mg  PO x 1 tonight  2. F/u AM INR   Noah Delaine, RPh Clinical Pharmacist Pager: [phone]  04/27/2013 9:23 AM

## 2013-04-27 NOTE — Progress Notes (Signed)
    SUBJECTIVE: No chest pain or SOB. Breathing is better.   BP 127/87  Pulse 79  Temp(Src) 97.6 F (36.4 C) (Oral)  Resp 18  Ht 5\' 3"  (1.6 m)  Wt 179 lb 7.3 oz (81.4 kg)  BMI 31.8 kg/m2  SpO2 95%  Intake/Output Summary (Last 24 hours) at 04/27/13 1148 Last data filed at 04/26/13 1300  Gross per 24 hour  Intake    240 ml  Output      0 ml  Net    240 ml    PHYSICAL EXAM General: Well developed, well nourished, in no acute distress. Alert and oriented x 3.  Psych:  Good affect, responds appropriately Neck: No JVD. No masses noted.  Lungs: Clear bilaterally with no wheezes or rhonci noted.  Heart: irreg irreg with no murmurs noted. Abdomen: Bowel sounds are present. Soft, non-tender.  Extremities: No lower extremity edema.   LABS: Basic Metabolic Panel:  Recent Labs  16/10/96 0450 04/27/13 0605  NA 136 139  K 3.8 4.0  CL 99 104  CO2 27 29  GLUCOSE 147* 99  BUN 16 24*  CREATININE 0.69 0.71  CALCIUM 9.8 9.6   CBC:  Recent Labs  04/25/13 0450 04/27/13 0605  WBC 4.9 6.9  HGB 14.4 14.3  HCT 42.4 42.5  MCV 93.6 93.6  PLT 167 169    Current Meds: . albuterol  2.5 mg Nebulization BID  . atorvastatin  10 mg Oral q1800  . bacitracin   Topical BID  . benzonatate  200 mg Oral TID  . diltiazem  240 mg Oral Daily  . guaiFENesin  600 mg Oral BID  . levofloxacin  500 mg Oral Daily  . levothyroxine  25 mcg Oral QAC breakfast  . predniSONE  50 mg Oral Q breakfast  . warfarin  3 mg Oral ONCE-1800  . Warfarin - Pharmacist Dosing Inpatient   Does not apply q1800    ASSESSMENT AND PLAN:  1. Atrial fibrillation: Rate controlled on Cardizem. She is anti-coagulated with coumadin and her INR is therapeutic.   2. Acute URI: Per primary team.    Should be ok for discharge from our standpoint.   Umer Harig  4/28/201411:48 AM

## 2013-04-27 NOTE — Care Management Note (Signed)
    Page 1 of 1   04/27/2013     2:54:41 PM   CARE MANAGEMENT NOTE 04/27/2013  Patient:  [name], [name]   Account Number:  [id number]  Date Initiated:  04/27/2013  Documentation initiated by:  Avaleigh Decuir  Subjective/Objective Assessment:   PT ADM ON 04/24/13 WITH AFIB, SOB.  PTA, PT INDEPENDENT, LIVES WITH SPOUSE.     Action/Plan:   MET WITH PT TO DISCUSS DC NEEDS.  PT DENIES ANY HOME NEEDS PRESENTLY.  WILL FOLLOW.   Anticipated DC Date:  04/27/2013   Anticipated DC Plan:  HOME/SELF CARE      DC Planning Services  CM consult      Choice offered to / List presented to:             Status of service:  Completed, signed off Medicare Important Message given?   (If response is "NO", the following Medicare IM given date fields will be blank) Date Medicare IM given:   Date Additional Medicare IM given:    Discharge Disposition:  HOME/SELF CARE  Per UR Regulation:  Reviewed for med. necessity/level of care/duration of stay  If discussed at Long Length of Stay Meetings, dates discussed:    Comments:

## 2013-04-27 NOTE — Discharge Summary (Addendum)
Physician Discharge Summary  TAKINA BUSSER ZOX:[id number] DOB: 1929-01-28 DOA: 04/24/2013  PCP: Lorenda Peck, MD  Admit date: 04/24/2013 Discharge date: 04/27/2013  Time spent: 40 minutes  Recommendations for Outpatient Follow-up:  1. Follow up with primary MD. 2. Primary MD to consider urology referral, for microscopic hematuria noted in urinalysis on 04/24/13. 3. Follow up with primary cardiologist, Dr Sanjuana Kava. Marland Kitchen   Discharge Diagnoses:  Active Problems:   Atrial fibrillation   Acute bronchitis   Reactive airway disease   Discharge Condition: Satisfactory.  Diet recommendation: Heart-Healthy.   Filed Weights   04/24/13 1945  Weight: 81.4 kg (179 lb 7.3 oz)    History of present illness:  79yo woman with PMH of PAF on coumadin and HTN who presented with worsening cough/URI symptoms, and was admitted for further management.     Hospital Course:  1. Acute on Chronic bronchitis: Patient presented with 2-3 days of increased coughing, with production of thick, yellow phlegm, associated with wheezing. CXR demonstrated only pulmonary vascular congestion, but nio pneumonic consolidation. Managed with bronchodilators, antibiotics, steroids and mucolytics, with satisfactory clinical response. She remained afebrile, phlegm quality improved. Wcc remained normal. As of 04/26/13, she had completed 3 days of Rocephin/Azithromycin. Transitioned to oral Levaquin from 04/27/13, for a further 5 days, to be completed on 05/01/13. On steroid taper.  2. Atrial fibrillation: Patient has known PAF, on chronic anticoagulation. She had episodes or rapid HR, during this hospitalization, possibly exacerbated initially, by Albuterol therapy. This was changed to Xopenex, Dr Dietrich Pates provided initial cardilology consultation, and patient was subsequently seen by Dr Valera Castle. Attempt to utilize beta-blocker on 04/25/13, was poorly tolerated by patient. She was changed to Cardizem CD on 04/25/13, and appears  to be tolerating it well. She has since remained rate-controlled.  3. Elevated BNP: ProBNP was elevated at 909.9 on 04/24/13. Clinically, she does not appear to have overt decompensation. Managed with pre-admission diuretics. Stable.  4. HTN: Reasonably controlled, during hospitalization.  5. Microscopic hematuria: Evident on U/A from 04/24/13. Possibly due to Coumadin, HB/HCT normal. Defer follow up and possible urology evaluation, to PMD.    Procedures:  See Below.   Consultations:  Dr Dietrich Pates, cardiologist.   Discharge Exam: Filed Vitals:   04/26/13 1343 04/26/13 1957 04/27/13 0402 04/27/13 1038  BP: 99/68 121/67 107/72 127/87  Pulse: 73 81 79   Temp: 97.9 F (36.6 C) 98 F (36.7 C) 97.6 F (36.4 C)   TempSrc: Oral Oral Oral   Resp: 16 18 18    Height:      Weight:      SpO2: 98% 94% 95%     General: Comfortable, alert, communicative, fully oriented, not short of breath at rest.  HEENT: No clinical pallor, no jaundice, no conjunctival injection or discharge.  NECK: Supple, JVP not seen, no carotid bruits, no palpable lymphadenopathy, no palpable goiter.  CHEST: Clinically clear to auscultation, no wheezes, no crackles.  HEART: Sounds 1 and 2 heard, normal, irregular, no murmurs.  ABDOMEN: Full, soft, non-tender, no palpable organomegaly, no palpable masses, normal bowel sounds.  GENITALIA: Not examined.  LOWER EXTREMITIES: No pitting edema, palpable peripheral pulses.  MUSCULOSKELETAL SYSTEM: Generalized osteoarthritic changes, otherwise, normal.  CENTRAL NERVOUS SYSTEM: No focal neurologic deficit on gross examination.   Discharge Instructions      Discharge Orders   Future Appointments Provider Department Dept Phone   05/01/2013 9:45 AM [facility] [phone]   Future Orders Complete By Expires  Diet - low sodium heart healthy  As directed     Increase activity slowly  As directed         Medication List     STOP taking these medications       cefdinir 300 MG capsule  Commonly known as:  OMNICEF      TAKE these medications       acetaminophen 500 MG tablet  Commonly known as:  TYLENOL  Take 500 mg by mouth every 6 (six) hours as needed. For pain     albuterol 108 (90 BASE) MCG/ACT inhaler  Commonly known as:  PROVENTIL HFA;VENTOLIN HFA  Inhale 2 puffs into the lungs every 4 (four) hours as needed (coughing).     atorvastatin 10 MG tablet  Commonly known as:  LIPITOR  Take 10 mg by mouth Daily.     bacitracin ointment  Apply topically 2 (two) times daily. Apply to skin wound.     benzonatate 200 MG capsule  Commonly known as:  TESSALON  Take 1 capsule (200 mg total) by mouth 3 (three) times daily.     diltiazem 240 MG 24 hr capsule  Commonly known as:  CARDIZEM CD  Take 240 mg by mouth daily.     Fish Oil 1200 MG Caps  Take 1 capsule by mouth daily.     furosemide 20 MG tablet  Commonly known as:  LASIX  Take 20 mg by mouth Daily.     guaiFENesin 600 MG 12 hr tablet  Commonly known as:  MUCINEX  Take 1 tablet (600 mg total) by mouth 2 (two) times daily.     Hydrocodone-Homatropine 5-1.5 MG Tabs  Take 1 tablet by mouth every 6 (six) hours as needed (cough).     levofloxacin 500 MG tablet  Commonly known as:  LEVAQUIN  Take 1 tablet (500 mg total) by mouth daily.     levothyroxine 50 MCG tablet  Commonly known as:  SYNTHROID, LEVOTHROID  Take 25 mcg by mouth Daily.     meclizine 25 MG tablet  Commonly known as:  ANTIVERT  Take 25 mg by mouth 2 (two) times daily as needed for dizziness.     OSTEO BI-FLEX JOINT SHIELD PO  Take 1 tablet by mouth 2 (two) times daily.     polyethylene glycol packet  Commonly known as:  MIRALAX / GLYCOLAX  Take 17 g by mouth daily as needed. For constipation     predniSONE 10 MG tablet  Commonly known as:  DELTASONE  Take 40 mg daily for 3 days, then 30 mg daily for 3 days, then 20 mg daily for 3 days, then 10 mg daily for 3  days, then stop.     senna 8.6 MG Tabs  Commonly known as:  SENOKOT  Take 1 tablet by mouth daily as needed. For constipation     vitamin B-12 100 MCG tablet  Commonly known as:  CYANOCOBALAMIN  Take 50 mcg by mouth daily.     vitamin E 400 UNIT capsule  Take 400 Units by mouth daily.     warfarin 3 MG tablet  Commonly known as:  COUMADIN  Take 1.5-3 mg by mouth every evening. Take one tablet (3 mg) every evening except on Sundays take half-tablet (1.5 mg).       Follow-up Information   Schedule an appointment as soon as possible for a visit with ROBERTS, Vernie Ammons, MD.   Contact information:   [address]  [place] [address] [phone]       Schedule an appointment as soon as possible for a visit with MCALHANY,CHRISTOPHER, MD.   Contact information:   [address] [place] [address] [phone]        The results of significant diagnostics from this hospitalization (including imaging, microbiology, ancillary and laboratory) are listed below for reference.    Significant Diagnostic Studies: Dg Chest 2 View  04/24/2013  *RADIOLOGY REPORT*  Clinical Data: Chest pain  CHEST - 2 VIEW  Comparison: April 01, 2012.  Findings: Cardiomediastinal silhouette appears normal. Mild central pulmonary vascular congestion is noted.  No pneumothorax or pleural effusion is noted.  No pneumonia or atelectasis is noted.  Bony thorax is intact.  IMPRESSION: Mild central pulmonary vascular congestion is noted.   Original Report Authenticated By: Lupita Raider.,  M.D.    Dg Chest 2 View  04/07/2013  *RADIOLOGY REPORT*  Clinical Data: Cough.  CHEST - 2 VIEW  Comparison: April 01, 2012.  Findings: Cardiomediastinal silhouette appears normal.  No acute pulmonary disease is noted.  Bony thorax is intact.  IMPRESSION: No acute cardiopulmonary abnormality seen.   Original Report Authenticated By: Lupita Raider.,  M.D.     Microbiology: Recent Results (from the  past 240 hour(s))  MRSA PCR SCREENING     Status: None   Collection Time    04/25/13  1:45 PM      Result Value Range Status   MRSA by PCR NEGATIVE  NEGATIVE Final   Comment:            The GeneXpert MRSA Assay (FDA     approved for NASAL specimens     only), is one component of a     comprehensive MRSA colonization     surveillance program. It is not     intended to diagnose MRSA     infection nor to guide or     monitor treatment for     MRSA infections.  WOUND CULTURE     Status: None   Collection Time    04/25/13  1:49 PM      Result Value Range Status   Specimen Description WOUND RIGHT EAR   Final   Special Requests NONE   Final   Gram Stain     Final   Value: NO WBC SEEN     NO SQUAMOUS EPITHELIAL CELLS SEEN     NO ORGANISMS SEEN   Culture NO GROWTH 2 DAYS   Final   Report Status 04/27/2013 FINAL   Final     Labs: Basic Metabolic Panel:  Recent Labs Lab 04/24/13 1126 04/25/13 0450 04/27/13 0605  NA 136 136 139  K 3.8 3.8 4.0  CL 100 99 104  CO2 28 27 29   GLUCOSE 122* 147* 99  BUN 15 16 24*  CREATININE 0.61 0.69 0.71  CALCIUM 9.7 9.8 9.6   Liver Function Tests:  Recent Labs Lab 04/24/13 1126  AST 19  ALT 13  ALKPHOS 125*  BILITOT 1.1  PROT 7.0  ALBUMIN 3.6    Recent Labs Lab 04/24/13 1142  LIPASE 21   No results found for this basename: AMMONIA,  in the last 168 hours CBC:  Recent Labs Lab 04/24/13 1126 04/25/13 0450 04/27/13 0605  WBC 6.0 4.9 6.9  NEUTROABS 4.9  --   --   HGB 14.2 14.4 14.3  HCT 41.4 42.4 42.5  MCV 94.3 93.6 93.6  PLT 158 167 169  Cardiac Enzymes: No results found for this basename: CKTOTAL, CKMB, CKMBINDEX, TROPONINI,  in the last 168 hours BNP: BNP (last 3 results)  Recent Labs  04/24/13 1133  PROBNP 909.9*   CBG: No results found for this basename: GLUCAP,  in the last 168 hours     Signed:  Siarra Gilkerson,CHRISTOPHER  [facility] 04/27/2013, 1:06 PM

## 2013-05-08 ENCOUNTER — Ambulatory Visit (INDEPENDENT_AMBULATORY_CARE_PROVIDER_SITE_OTHER): Payer: Medicare Other | Admitting: *Deleted

## 2013-05-08 DIAGNOSIS — I4891 Unspecified atrial fibrillation: Secondary | ICD-10-CM

## 2013-05-08 DIAGNOSIS — Z7901 Long term (current) use of anticoagulants: Secondary | ICD-10-CM

## 2013-05-08 LAB — POCT INR: INR: 3.3

## 2013-06-05 ENCOUNTER — Ambulatory Visit (INDEPENDENT_AMBULATORY_CARE_PROVIDER_SITE_OTHER): Payer: Medicare Other | Admitting: *Deleted

## 2013-06-05 DIAGNOSIS — Z7901 Long term (current) use of anticoagulants: Secondary | ICD-10-CM

## 2013-06-05 DIAGNOSIS — I4891 Unspecified atrial fibrillation: Secondary | ICD-10-CM

## 2013-06-05 LAB — POCT INR: INR: 3.8

## 2013-06-18 ENCOUNTER — Ambulatory Visit (INDEPENDENT_AMBULATORY_CARE_PROVIDER_SITE_OTHER): Payer: Medicare Other | Admitting: *Deleted

## 2013-06-18 DIAGNOSIS — Z7901 Long term (current) use of anticoagulants: Secondary | ICD-10-CM

## 2013-06-18 DIAGNOSIS — I4891 Unspecified atrial fibrillation: Secondary | ICD-10-CM

## 2013-06-18 LAB — POCT INR: INR: 2

## 2013-06-30 ENCOUNTER — Encounter: Payer: Self-pay | Admitting: *Deleted

## 2013-07-01 ENCOUNTER — Ambulatory Visit (INDEPENDENT_AMBULATORY_CARE_PROVIDER_SITE_OTHER): Payer: Medicare Other | Admitting: Cardiovascular Disease

## 2013-07-01 ENCOUNTER — Encounter: Payer: Self-pay | Admitting: Cardiovascular Disease

## 2013-07-01 ENCOUNTER — Ambulatory Visit (INDEPENDENT_AMBULATORY_CARE_PROVIDER_SITE_OTHER): Payer: Medicare Other | Admitting: *Deleted

## 2013-07-01 VITALS — BP 132/79 | HR 94 | Ht 63.0 in | Wt 183.0 lb

## 2013-07-01 DIAGNOSIS — I059 Rheumatic mitral valve disease, unspecified: Secondary | ICD-10-CM

## 2013-07-01 DIAGNOSIS — I4891 Unspecified atrial fibrillation: Secondary | ICD-10-CM

## 2013-07-01 DIAGNOSIS — I340 Nonrheumatic mitral (valve) insufficiency: Secondary | ICD-10-CM

## 2013-07-01 DIAGNOSIS — Z7901 Long term (current) use of anticoagulants: Secondary | ICD-10-CM

## 2013-07-01 LAB — POCT INR: INR: 2

## 2013-07-01 NOTE — Patient Instructions (Addendum)
Your physician wants you to follow-up in: 6 months. You will receive a reminder letter in the mail two months in advance. If you don't receive a letter, please call our office to schedule the follow-up appointment.  Your physician has requested that you have an echocardiogram. Echocardiography is a painless test that uses sound waves to create images of your heart. It provides your doctor with information about the size and shape of your heart and how well your heart's chambers and valves are working. This procedure takes approximately one hour. There are no restrictions for this procedure. To be done in December 2014

## 2013-07-01 NOTE — Progress Notes (Signed)
History of Present Illness: [name] with history of colon cancer, hypothyroidism, HLD, HTN, hemochromatosis, atrial fibrillation here today for cardiac follow up. When I met her in September of 2012, she was in sinus rhythm. She has been continued on coumadin. Her most recent echo September 2013 showed mild AI, moderate MR, normal LV function. echo showed mild aortic valve insufficiency and mild to moderate mitral regurgitation in August 2012. I saw her last in August 2013 and she was planning a surgical procedure at that time. She underwent laparoscopic lysis of adhesions, biopsy of the liver mass, a laparoscopic right colectomy on April 09, 2011 per Dr. Derrell [facility]. Final pathology report showed a benign tubular adenoma of the colon, a degenerating hyalinized cyst of the liver. She was admitted to [name] 04/24/13 with bronchitis.    She is here today for cardiac follow up. She is feeling well. No chest pain or SOB. No dizziness, near syncope or syncope. Dark spots on face/chin and seen by Derm. Question if related to Diltiazem.    Primary Care Physician: Dr. [facility].  Last Lipid Profile:Lipid Panel     Component Value Date/Time   CHOL 153 08/29/2011 0235   TRIG 80 08/29/2011 0235   HDL 64 08/29/2011 0235   CHOLHDL 2.4 08/29/2011 0235   VLDL 16 08/29/2011 0235   LDLCALC 73 08/29/2011 0235     Past Medical History  Diagnosis Date  . HTN (hypertension)   . Environmental allergies   . Arthritis   . Thyroid disease   . History of colon cancer   . Hemochromatosis 1998  . Atrial fibrillation   . Constipation   . Arthritis pain   . Cancer   . PONV (postoperative nausea and vomiting)   . Varicose vein of leg     Past Surgical History  Procedure Laterality Date  . Colon surgery  1996    CANCER REMOVAL  . Total hip arthroplasty  2004    RIGHT  . Gallbladder surgery  2005  . Breast surgery  1991    Rt br lump removed-benign  . Liver biopsy  04/08/2012    Procedure: LIVER BIOPSY;   Surgeon: Sheila Mention, MD;  Location: [name];  Service: General;;  laparoscopic    Current Outpatient Prescriptions  Medication Sig Dispense Refill  . acetaminophen (TYLENOL) 500 MG tablet Take 500 mg by mouth every 6 (six) hours as needed. For pain      . atorvastatin (LIPITOR) 10 MG tablet Take 10 mg by mouth Daily.       Marland Kitchen diltiazem (CARDIZEM CD) 240 MG 24 hr capsule Take 240 mg by mouth daily.      . furosemide (LASIX) 20 MG tablet Take 20 mg by mouth Daily.       Marland Kitchen guaiFENesin (MUCINEX) 600 MG 12 hr tablet Take 1 tablet (600 mg total) by mouth 2 (two) times daily.  14 tablet  0  . levothyroxine (SYNTHROID, LEVOTHROID) 50 MCG tablet Take 25 mcg by mouth Daily.       . meclizine (ANTIVERT) 25 MG tablet Take 25 mg by mouth 2 (two) times daily as needed for dizziness.       . Omega-3 Fatty Acids (FISH OIL) 1200 MG CAPS Take 1 capsule by mouth daily.       . polyethylene glycol (MIRALAX / GLYCOLAX) packet Take 17 g by mouth daily as needed. For constipation      . senna (SENOKOT) 8.6 MG TABS Take 1 tablet  by mouth daily as needed. For constipation      . vitamin B-12 (CYANOCOBALAMIN) 100 MCG tablet Take 50 mcg by mouth daily.        . vitamin E 400 UNIT capsule Take 400 Units by mouth daily.        Marland Kitchen warfarin (COUMADIN) 3 MG tablet Take 1.5-3 mg by mouth every evening. Take one tablet (3 mg) every evening except on Sundays take half-tablet (1.5 mg).       No current facility-administered medications for this visit.    Allergies  Allergen Reactions  . Adhesive (Tape) Rash  . Latex Rash    Per Patient Latex=lip swelling    History   Social History  . Marital Status: Married    Spouse Name: N/A    Number of Children: N/A  . Years of Education: N/A   Occupational History  . RETIRED [name]   Social History Main Topics  . Smoking status: Never Smoker   . Smokeless tobacco: Not on file  . Alcohol Use: No  . Drug Use: No  . Sexually Active: Not on file   Other Topics  Concern  . Not on file   Social History Narrative  . No narrative on file    Family History  Problem Relation Age of Onset  . Heart disease Mother   . Stroke Mother   . Heart disease Father   . Pneumonia Brother     Review of Systems:  As stated in the HPI and otherwise negative.   BP 132/79  Pulse 94  Ht 5\' 3"  (1.6 m)  Wt 183 lb (83.008 kg)  BMI 32.43 kg/m2  Physical Examination: General: Well developed, well nourished, NAD HEENT: OP clear, mucus membranes moist SKIN: warm, dry. No rashes. Neuro: No focal deficits Musculoskeletal: Muscle strength 5/5 all ext Psychiatric: Mood and affect normal Neck: No JVD, no carotid bruits, no thyromegaly, no lymphadenopathy. Lungs:Clear bilaterally, no wheezes, rhonci, crackles Cardiovascular: Irregular irregular. No murmurs, gallops or rubs. Abdomen:Soft. Bowel sounds present. Non-tender.  Extremities: No lower extremity edema. Pulses are 2 + in the bilateral DP/PT.   Assessment and Plan:   1. Atrial fibrillation: Rate controlled. She appears to be in atrial fibrillation today. No palpitations at home. Continue coumadin. Continue Cardizem CD 240 mg po Qdaily.   2. Mitral regurgitation: Moderate MR by echo September 2013. Will follow with repeat echo in December 2014.

## 2013-07-22 ENCOUNTER — Ambulatory Visit (INDEPENDENT_AMBULATORY_CARE_PROVIDER_SITE_OTHER): Payer: Medicare Other | Admitting: *Deleted

## 2013-07-22 DIAGNOSIS — I4891 Unspecified atrial fibrillation: Secondary | ICD-10-CM

## 2013-07-22 DIAGNOSIS — Z7901 Long term (current) use of anticoagulants: Secondary | ICD-10-CM

## 2013-07-22 LAB — POCT INR: INR: 2

## 2013-08-17 ENCOUNTER — Other Ambulatory Visit: Payer: Self-pay | Admitting: Cardiovascular Disease

## 2013-08-19 ENCOUNTER — Ambulatory Visit (INDEPENDENT_AMBULATORY_CARE_PROVIDER_SITE_OTHER): Payer: Medicare Other | Admitting: *Deleted

## 2013-08-19 DIAGNOSIS — I4891 Unspecified atrial fibrillation: Secondary | ICD-10-CM

## 2013-08-19 DIAGNOSIS — Z7901 Long term (current) use of anticoagulants: Secondary | ICD-10-CM

## 2013-08-31 ENCOUNTER — Telehealth: Payer: Self-pay | Admitting: Cardiovascular Disease

## 2013-08-31 NOTE — Telephone Encounter (Signed)
Documentation received from [facility] Ok to stop Coumadin on 10.4.14 for GI procedure. Patient may restart as soon as it's safe from a GI perspective C. McAlhany,MD

## 2013-09-16 ENCOUNTER — Ambulatory Visit (INDEPENDENT_AMBULATORY_CARE_PROVIDER_SITE_OTHER): Payer: Medicare Other | Admitting: Pharmacist

## 2013-09-16 DIAGNOSIS — Z7901 Long term (current) use of anticoagulants: Secondary | ICD-10-CM

## 2013-09-16 DIAGNOSIS — I4891 Unspecified atrial fibrillation: Secondary | ICD-10-CM

## 2013-09-16 MED ORDER — WARFARIN SODIUM 3 MG PO TABS: ORAL_TABLET | ORAL | Status: DC

## 2013-10-08 ENCOUNTER — Other Ambulatory Visit: Payer: Self-pay | Admitting: Gastroenterology

## 2013-10-19 ENCOUNTER — Other Ambulatory Visit: Payer: Self-pay

## 2013-10-19 DIAGNOSIS — Z1231 Encounter for screening mammogram for malignant neoplasm of breast: Secondary | ICD-10-CM

## 2013-10-20 ENCOUNTER — Ambulatory Visit (INDEPENDENT_AMBULATORY_CARE_PROVIDER_SITE_OTHER): Payer: Medicare Other | Admitting: *Deleted

## 2013-10-20 DIAGNOSIS — I4891 Unspecified atrial fibrillation: Secondary | ICD-10-CM

## 2013-10-20 DIAGNOSIS — Z7901 Long term (current) use of anticoagulants: Secondary | ICD-10-CM

## 2013-10-30 ENCOUNTER — Ambulatory Visit (INDEPENDENT_AMBULATORY_CARE_PROVIDER_SITE_OTHER): Payer: Medicare Other | Admitting: *Deleted

## 2013-10-30 DIAGNOSIS — I4891 Unspecified atrial fibrillation: Secondary | ICD-10-CM

## 2013-10-30 DIAGNOSIS — Z7901 Long term (current) use of anticoagulants: Secondary | ICD-10-CM

## 2013-10-30 LAB — POCT INR: INR: 2.2

## 2013-10-31 IMAGING — CR DG CHEST 2V
2 series · 2 of 2 positions shown · non-contrast
Comparison: April 01, 2012.

CLINICAL DATA: Cough.

CHEST - 2 VIEW

[w chest pa]
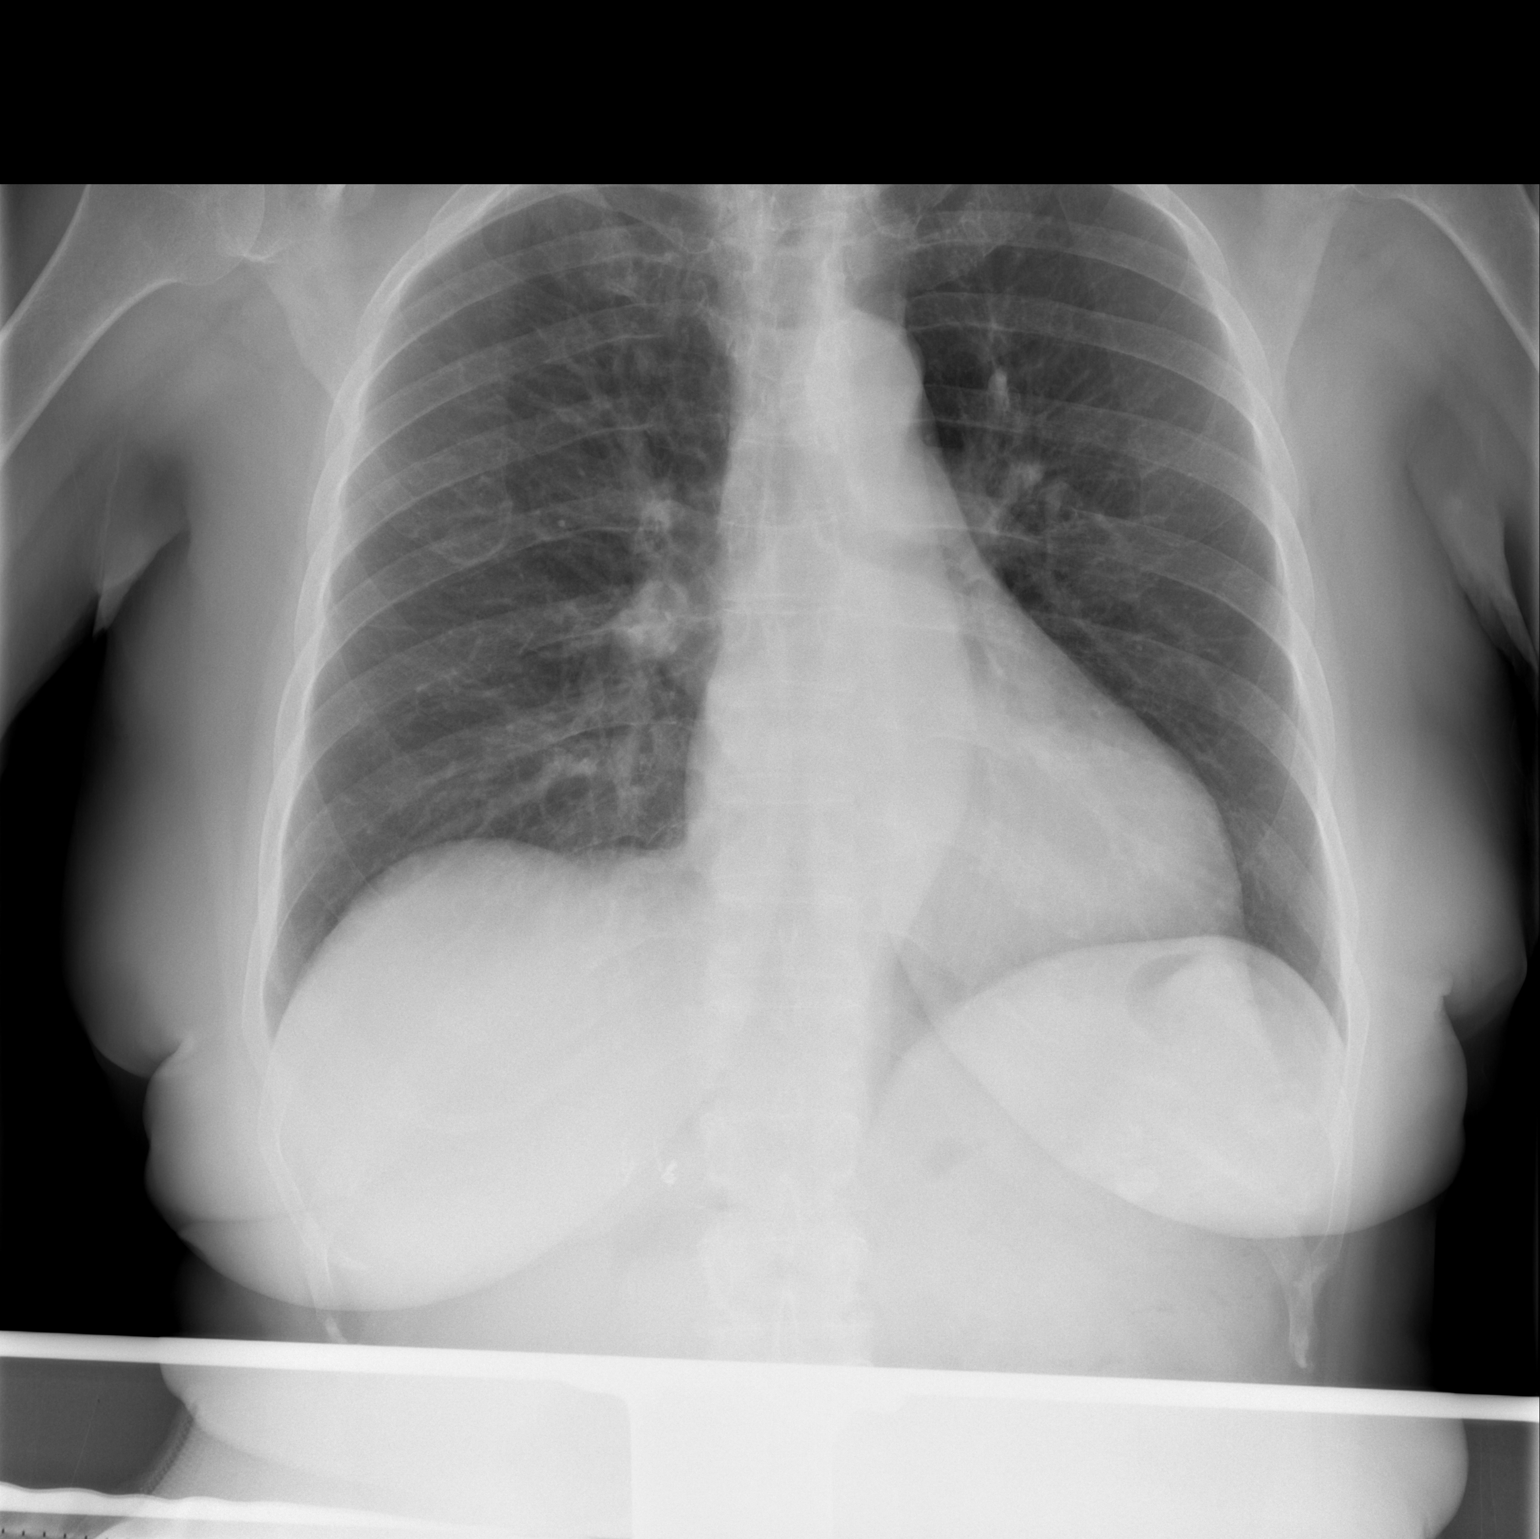

[w chest lat]
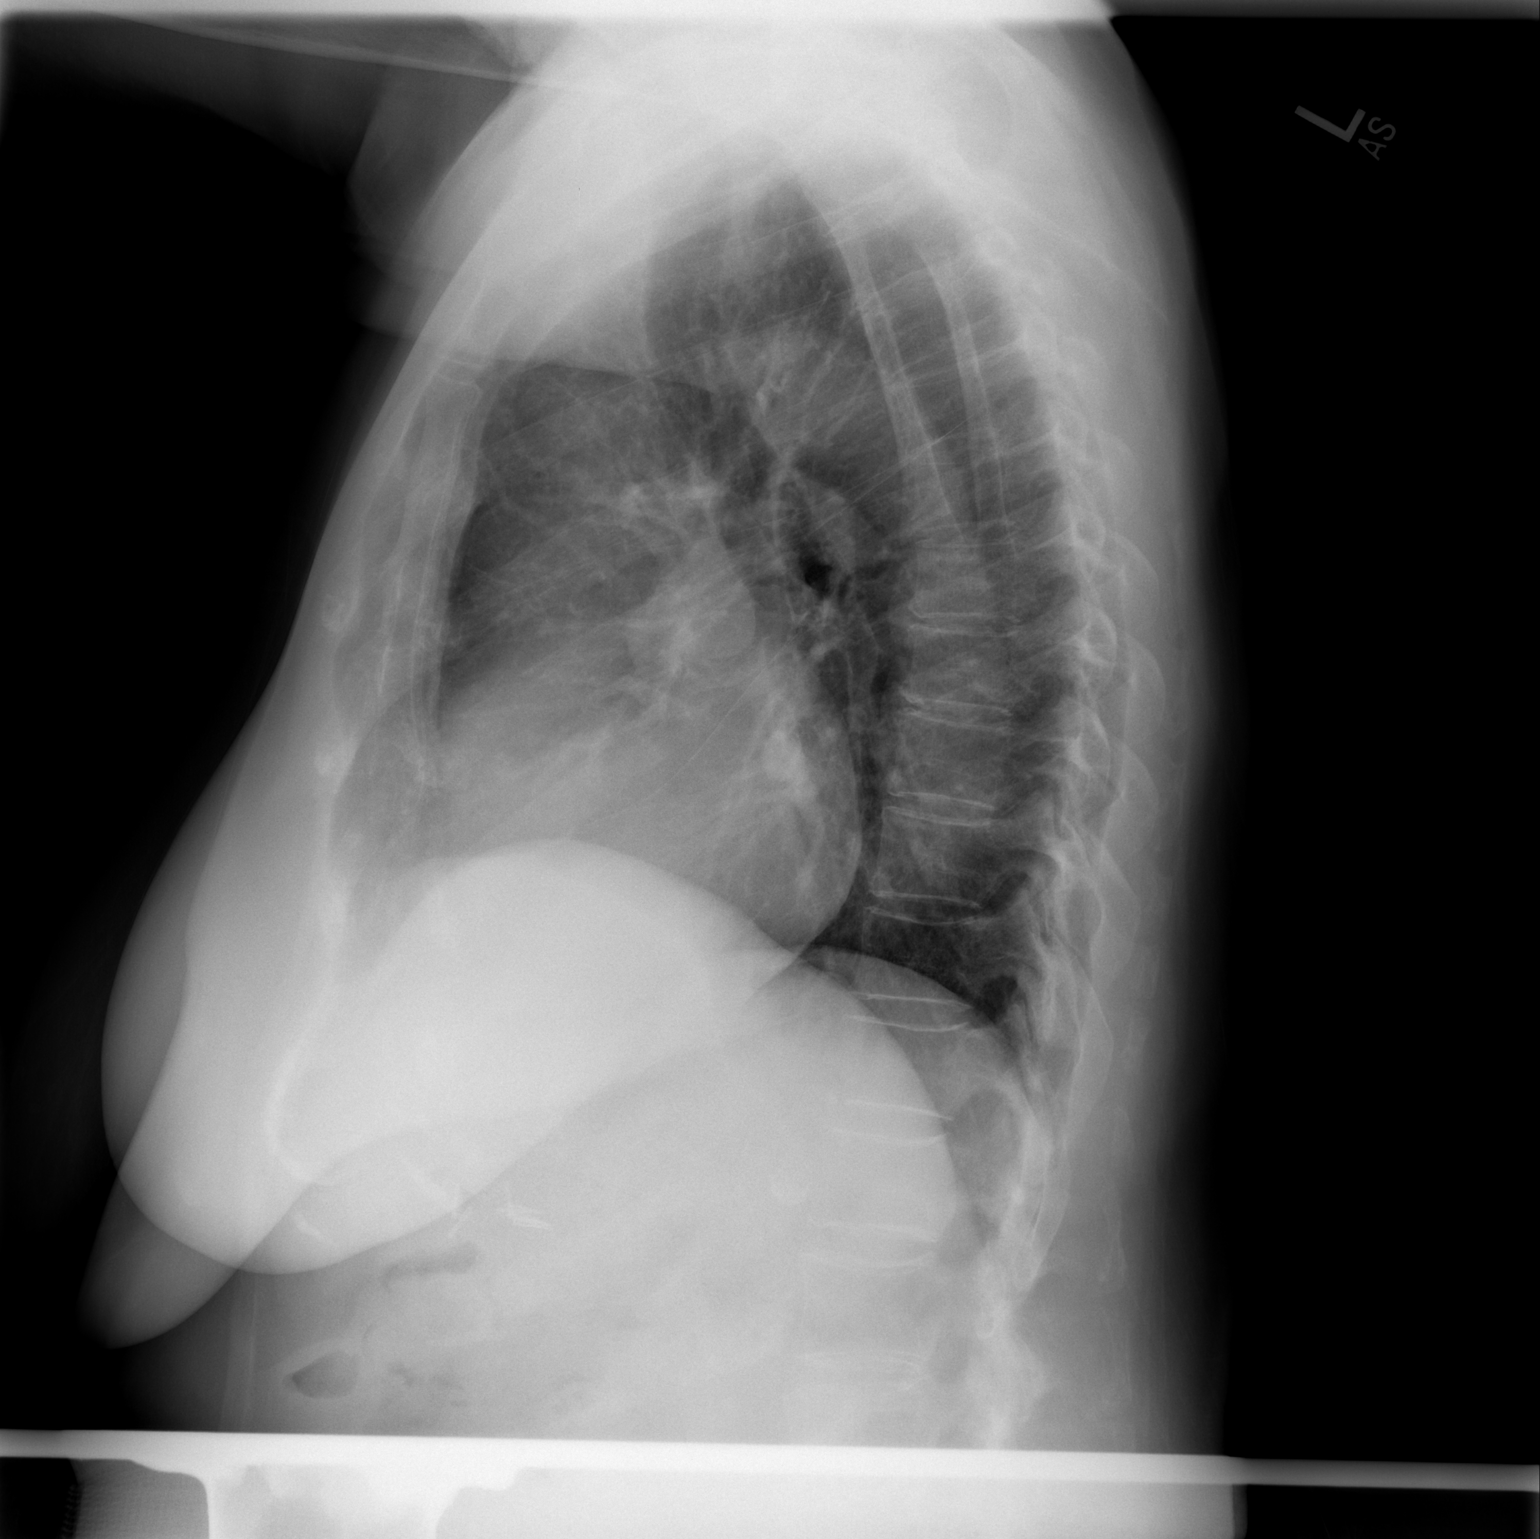

[2 of 2 positions shown; findings below may reference images not displayed]

FINDINGS: Cardiomediastinal silhouette appears normal.  No acute
pulmonary disease is noted.  Bony thorax is intact.
IMPRESSION: No acute cardiopulmonary abnormality seen.

## 2013-11-03 ENCOUNTER — Encounter (INDEPENDENT_AMBULATORY_CARE_PROVIDER_SITE_OTHER): Payer: Medicare Other | Admitting: General Surgery

## 2013-11-07 ENCOUNTER — Other Ambulatory Visit: Payer: Self-pay | Admitting: Cardiovascular Disease

## 2013-11-17 IMAGING — CR DG CHEST 2V
2 series · 2 of 2 positions shown · non-contrast
Comparison: April 01, 2012.

CLINICAL DATA: Chest pain

CHEST - 2 VIEW

[w chest pa]
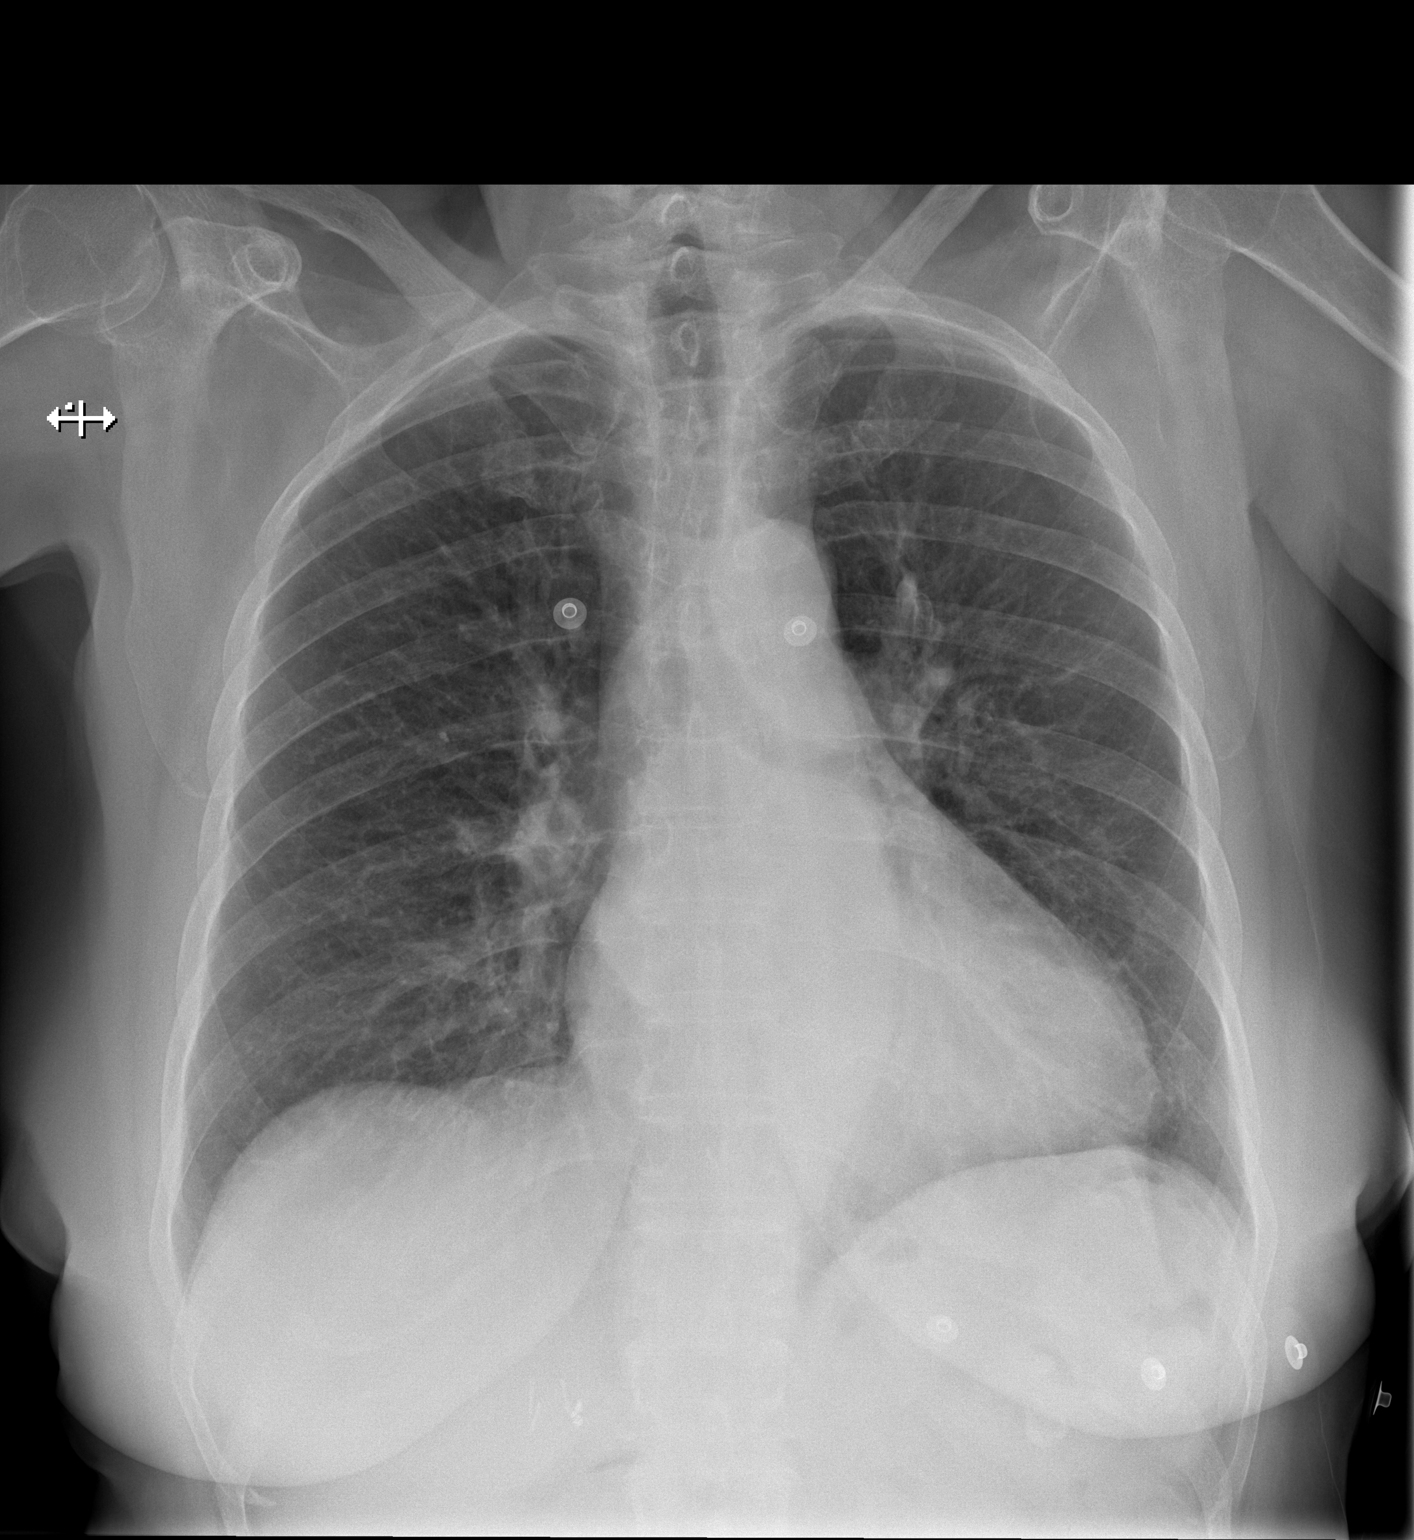

[w chest lat]
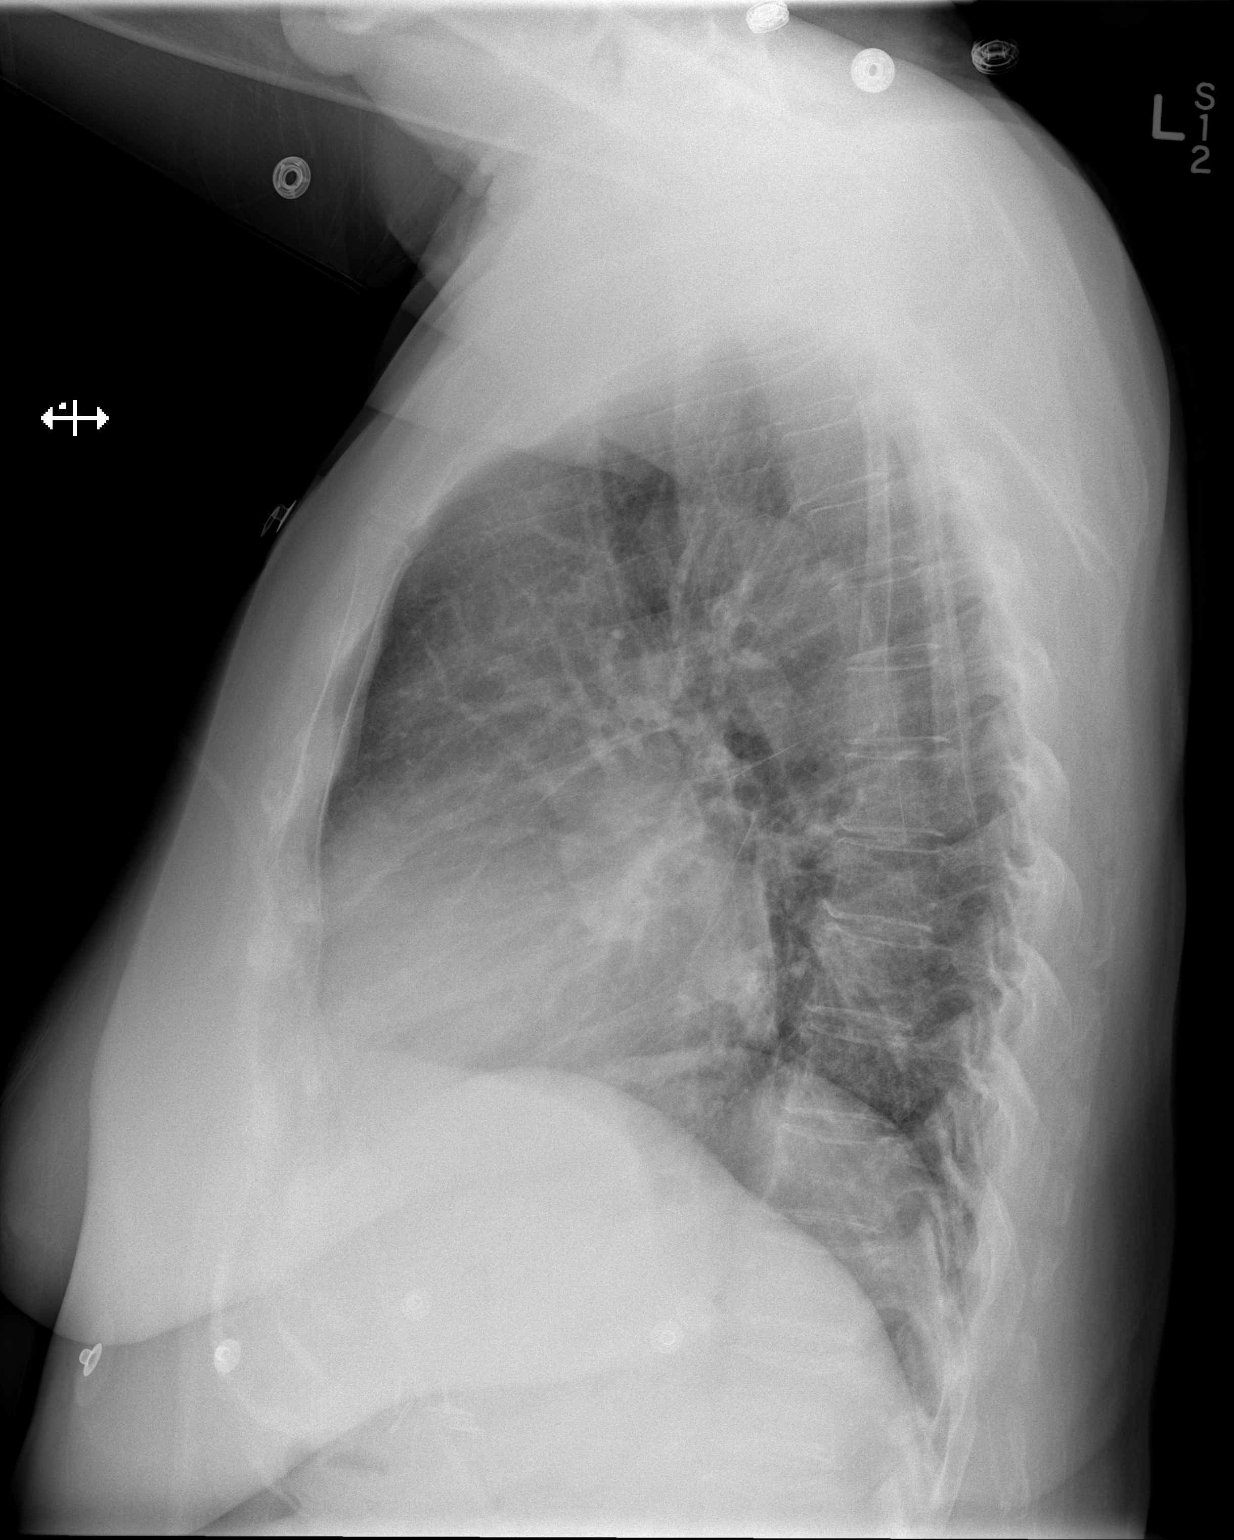

[2 of 2 positions shown; findings below may reference images not displayed]

FINDINGS: Cardiomediastinal silhouette appears normal. Mild central
pulmonary vascular congestion is noted.  No pneumothorax or pleural
effusion is noted.  No pneumonia or atelectasis is noted.  Bony
thorax is intact.
IMPRESSION: Mild central pulmonary vascular congestion is noted.

## 2013-11-18 ENCOUNTER — Ambulatory Visit
Admission: RE | Admit: 2013-11-18 | Discharge: 2013-11-18 | Disposition: A | Discharge status: Home / Self Care | Payer: Medicare Other | Source: Ambulatory Visit

## 2013-11-18 DIAGNOSIS — Z1231 Encounter for screening mammogram for malignant neoplasm of breast: Secondary | ICD-10-CM

## 2013-11-23 ENCOUNTER — Ambulatory Visit (INDEPENDENT_AMBULATORY_CARE_PROVIDER_SITE_OTHER): Payer: Medicare Other | Admitting: General Practice

## 2013-11-23 DIAGNOSIS — Z7901 Long term (current) use of anticoagulants: Secondary | ICD-10-CM

## 2013-11-23 DIAGNOSIS — I4891 Unspecified atrial fibrillation: Secondary | ICD-10-CM

## 2013-12-21 ENCOUNTER — Ambulatory Visit (INDEPENDENT_AMBULATORY_CARE_PROVIDER_SITE_OTHER): Payer: Medicare Other | Admitting: Pharmacist

## 2013-12-21 DIAGNOSIS — I4891 Unspecified atrial fibrillation: Secondary | ICD-10-CM

## 2013-12-21 DIAGNOSIS — Z7901 Long term (current) use of anticoagulants: Secondary | ICD-10-CM

## 2014-01-18 ENCOUNTER — Ambulatory Visit (INDEPENDENT_AMBULATORY_CARE_PROVIDER_SITE_OTHER): Payer: Medicare Other | Admitting: Pharmacist

## 2014-01-18 DIAGNOSIS — Z7901 Long term (current) use of anticoagulants: Secondary | ICD-10-CM

## 2014-01-18 DIAGNOSIS — I4891 Unspecified atrial fibrillation: Secondary | ICD-10-CM

## 2014-01-18 LAB — POCT INR: INR: 3.8

## 2014-02-08 ENCOUNTER — Ambulatory Visit (INDEPENDENT_AMBULATORY_CARE_PROVIDER_SITE_OTHER): Payer: Medicare Other | Admitting: *Deleted

## 2014-02-08 DIAGNOSIS — Z7901 Long term (current) use of anticoagulants: Secondary | ICD-10-CM

## 2014-02-08 DIAGNOSIS — I4891 Unspecified atrial fibrillation: Secondary | ICD-10-CM

## 2014-02-08 LAB — POCT INR: INR: 3.1

## 2014-02-22 ENCOUNTER — Ambulatory Visit (INDEPENDENT_AMBULATORY_CARE_PROVIDER_SITE_OTHER): Payer: Medicare Other

## 2014-02-22 DIAGNOSIS — I4891 Unspecified atrial fibrillation: Secondary | ICD-10-CM

## 2014-02-22 DIAGNOSIS — Z7901 Long term (current) use of anticoagulants: Secondary | ICD-10-CM

## 2014-02-22 LAB — POCT INR: INR: 3

## 2014-03-15 ENCOUNTER — Ambulatory Visit (INDEPENDENT_AMBULATORY_CARE_PROVIDER_SITE_OTHER): Payer: Medicare Other | Admitting: *Deleted

## 2014-03-15 DIAGNOSIS — Z5181 Encounter for therapeutic drug level monitoring: Secondary | ICD-10-CM

## 2014-03-15 DIAGNOSIS — I4891 Unspecified atrial fibrillation: Secondary | ICD-10-CM

## 2014-03-15 DIAGNOSIS — Z7901 Long term (current) use of anticoagulants: Secondary | ICD-10-CM

## 2014-03-15 LAB — POCT INR: INR: 2.9

## 2014-04-12 ENCOUNTER — Ambulatory Visit (INDEPENDENT_AMBULATORY_CARE_PROVIDER_SITE_OTHER): Payer: Medicare Other

## 2014-04-12 DIAGNOSIS — Z7901 Long term (current) use of anticoagulants: Secondary | ICD-10-CM

## 2014-04-12 DIAGNOSIS — Z5181 Encounter for therapeutic drug level monitoring: Secondary | ICD-10-CM

## 2014-04-12 DIAGNOSIS — I4891 Unspecified atrial fibrillation: Secondary | ICD-10-CM

## 2014-04-12 LAB — POCT INR: INR: 3.8

## 2014-04-26 ENCOUNTER — Ambulatory Visit (INDEPENDENT_AMBULATORY_CARE_PROVIDER_SITE_OTHER): Payer: Medicare Other | Admitting: Pharmacist

## 2014-04-26 DIAGNOSIS — Z5181 Encounter for therapeutic drug level monitoring: Secondary | ICD-10-CM

## 2014-04-26 DIAGNOSIS — I4891 Unspecified atrial fibrillation: Secondary | ICD-10-CM

## 2014-04-26 DIAGNOSIS — Z7901 Long term (current) use of anticoagulants: Secondary | ICD-10-CM

## 2014-04-26 LAB — POCT INR: INR: 4.7

## 2014-05-03 ENCOUNTER — Ambulatory Visit (INDEPENDENT_AMBULATORY_CARE_PROVIDER_SITE_OTHER): Payer: Medicare Other | Admitting: *Deleted

## 2014-05-03 DIAGNOSIS — Z7901 Long term (current) use of anticoagulants: Secondary | ICD-10-CM

## 2014-05-03 DIAGNOSIS — Z5181 Encounter for therapeutic drug level monitoring: Secondary | ICD-10-CM

## 2014-05-03 DIAGNOSIS — I4891 Unspecified atrial fibrillation: Secondary | ICD-10-CM

## 2014-05-03 LAB — POCT INR: INR: 2.6

## 2014-05-18 ENCOUNTER — Ambulatory Visit (INDEPENDENT_AMBULATORY_CARE_PROVIDER_SITE_OTHER): Payer: Medicare Other | Admitting: *Deleted

## 2014-05-18 DIAGNOSIS — Z5181 Encounter for therapeutic drug level monitoring: Secondary | ICD-10-CM

## 2014-05-18 DIAGNOSIS — Z7901 Long term (current) use of anticoagulants: Secondary | ICD-10-CM

## 2014-05-18 DIAGNOSIS — I4891 Unspecified atrial fibrillation: Secondary | ICD-10-CM

## 2014-05-18 LAB — POCT INR: INR: 3

## 2014-06-10 ENCOUNTER — Ambulatory Visit (INDEPENDENT_AMBULATORY_CARE_PROVIDER_SITE_OTHER): Payer: Medicare Other | Admitting: *Deleted

## 2014-06-10 DIAGNOSIS — Z7901 Long term (current) use of anticoagulants: Secondary | ICD-10-CM

## 2014-06-10 DIAGNOSIS — I4891 Unspecified atrial fibrillation: Secondary | ICD-10-CM

## 2014-06-10 DIAGNOSIS — Z5181 Encounter for therapeutic drug level monitoring: Secondary | ICD-10-CM

## 2014-06-10 LAB — POCT INR: INR: 2.6

## 2014-07-13 ENCOUNTER — Telehealth: Payer: Self-pay | Admitting: *Deleted

## 2014-07-13 ENCOUNTER — Ambulatory Visit (INDEPENDENT_AMBULATORY_CARE_PROVIDER_SITE_OTHER): Payer: Medicare Other | Admitting: Physician Assistant

## 2014-07-13 ENCOUNTER — Ambulatory Visit (INDEPENDENT_AMBULATORY_CARE_PROVIDER_SITE_OTHER): Payer: Medicare Other | Admitting: *Deleted

## 2014-07-13 ENCOUNTER — Encounter: Payer: Self-pay | Admitting: Physician Assistant

## 2014-07-13 VITALS — BP 140/79 | HR 79 | Ht 63.0 in | Wt 187.0 lb

## 2014-07-13 DIAGNOSIS — I4891 Unspecified atrial fibrillation: Secondary | ICD-10-CM

## 2014-07-13 DIAGNOSIS — Z5181 Encounter for therapeutic drug level monitoring: Secondary | ICD-10-CM

## 2014-07-13 DIAGNOSIS — I482 Chronic atrial fibrillation, unspecified: Secondary | ICD-10-CM

## 2014-07-13 DIAGNOSIS — I059 Rheumatic mitral valve disease, unspecified: Secondary | ICD-10-CM

## 2014-07-13 DIAGNOSIS — R609 Edema, unspecified: Secondary | ICD-10-CM

## 2014-07-13 DIAGNOSIS — Z7901 Long term (current) use of anticoagulants: Secondary | ICD-10-CM

## 2014-07-13 DIAGNOSIS — I340 Nonrheumatic mitral (valve) insufficiency: Secondary | ICD-10-CM | POA: Insufficient documentation

## 2014-07-13 LAB — BASIC METABOLIC PANEL
BUN: 15 mg/dL (ref 6–23)
CO2: 26 mEq/L (ref 19–32)
CREATININE: 0.8 mg/dL (ref 0.4–1.2)
Calcium: 10.3 mg/dL (ref 8.4–10.5)
Chloride: 103 mEq/L (ref 96–112)
GFR: 70.83 mL/min (ref >60.00)
Glucose, Bld: 91 mg/dL (ref 70–99)
Potassium: 3.6 mEq/L (ref 3.5–5.1)
Sodium: 138 mEq/L (ref 135–145)

## 2014-07-13 LAB — POCT INR: INR: 2.5

## 2014-07-13 NOTE — Progress Notes (Signed)
Cardiology Office Note    Date:  07/13/2014   ID:  [name], DOB 1929-09-20, MRN [id number]  PCP:  Myriam Jacobson, MD  Cardiologist:  Dr. Lauree Chandler      History of Present Illness: [name] is a 80 y.o. female with the history of colon CA, hypothyroidism, HTN, HL, hemachromatosis, mitral regurgitation and atrial fibrillation.  She is on Coumadin for anticoagulation. Last seen by Dr. Lauree Chandler in 06/2013.  Plan was to repeat Echo in 11/2013.    She returns for followup. Over the last month or so, she has noted increasing LE edema. Her PCP increased her Lasix.  She has had some improvement.  She denies significant DOE.  She is NYHA 2b.  She is mainly limited by her DJD.  She denies orthopnea, PND, syncope, chest pain.     Studies:  - Echo (09/23/12):  EF 55-60%, normal wall motion, grade 1 diastolic dysfunction, mild AI, moderate MR, moderate LAE, PASP 32 mm Hg    Recent Labs: No results found for requested labs within last 365 days.  Wt Readings from Last 3 Encounters:  07/13/14 187 lb (84.823 kg)  07/01/13 183 lb (83.008 kg)  04/24/13 179 lb 7.3 oz (81.4 kg)     Past Medical History  Diagnosis Date  . HTN (hypertension)   . Environmental allergies   . Arthritis   . Thyroid disease   . History of colon cancer   . Hemochromatosis 1998  . Atrial fibrillation   . Constipation   . Arthritis pain   . Cancer   . PONV (postoperative nausea and vomiting)   . Varicose vein of leg     Current Outpatient Prescriptions  Medication Sig Dispense Refill  . acetaminophen (TYLENOL) 500 MG tablet Take 500 mg by mouth every 6 (six) hours as needed. For pain      . atorvastatin (LIPITOR) 10 MG tablet Take 10 mg by mouth Daily.       Marland Kitchen diltiazem (CARDIZEM CD) 240 MG 24 hr capsule Take 240 mg by mouth daily.      . furosemide (LASIX) 20 MG tablet Take 20 mg by mouth Daily. EXTRA 20 MG PRN FOR EDEMA PT STATES PER PCP      . Glucosamine-Chondroitin  (MOVE FREE PO) Take 1 tablet by mouth 2 (two) times daily.      Marland Kitchen guaiFENesin (MUCINEX) 600 MG 12 hr tablet Take 1 tablet (600 mg total) by mouth 2 (two) times daily.  14 tablet  0  . levothyroxine (SYNTHROID, LEVOTHROID) 50 MCG tablet Take 25 mcg by mouth Daily.       . meclizine (ANTIVERT) 25 MG tablet Take 25 mg by mouth 2 (two) times daily as needed for dizziness.       . Omega-3 Fatty Acids (FISH OIL) 1200 MG CAPS Take 1 capsule by mouth daily.       . polyethylene glycol (MIRALAX / GLYCOLAX) packet Take 17 g by mouth daily as needed. For constipation      . senna (SENOKOT) 8.6 MG TABS Take 1 tablet by mouth daily as needed. For constipation      . vitamin B-12 (CYANOCOBALAMIN) 100 MCG tablet Take 50 mcg by mouth daily.        . vitamin E 400 UNIT capsule Take 400 Units by mouth daily.        Marland Kitchen warfarin (COUMADIN) 3 MG tablet TAKE AS DIRECTED BY ANTICOAGULATION CLINIC.  90 tablet  1  No current facility-administered medications for this visit.    Allergies:   Adhesive and Latex   Social History:  The patient  reports that she has never smoked. She does not have any smokeless tobacco history on file. She reports that she does not drink alcohol or use illicit drugs.   Family History:  The patient's family history includes Heart disease in her father and mother; Pneumonia in her brother; Stroke in her mother.   ROS:  Please see the history of present illness.   She denies significant cough.   All other systems reviewed and negative.   PHYSICAL EXAM: VS:  BP 140/79  Pulse 79  Ht 5\' 3"  (1.6 m)  Wt 187 lb (84.823 kg)  BMI 33.13 kg/m2 Well nourished, well developed, in no acute distress HEENT: normal Neck: no JVD Cardiac:  normal S1, S2; irregularly irregular rhythm; no murmur Lungs:  clear to auscultation bilaterally, no wheezing, rhonchi or rales Abd: soft, nontender, no hepatomegaly Ext: trace-1+ bilateral LE pitting edema, multiple varicosities noted bilaterally Skin: warm and  dry Neuro:  CNs 2-12 intact, no focal abnormalities noted  EKG:  Atrial fibrillation, HR 79     ASSESSMENT AND PLAN:  1. Edema:  I suspect that this is overall related to venous insufficiency more than anything else. She can try taking Lasix 40 mg every other day alternated with 20 mg every other day for one week. She should then resume the regimen prescribed by her PCP. I have recommended that she wear compression stockings every day. Check a basic metabolic panel today to followup on renal function and potassium. Arrange followup echocardiogram as planned. 2. Chronic atrial fibrillation:  Rate controlled. She is tolerating Coumadin. 3. Mitral regurgitation:  Arrange followup echocardiogram as planned. 4. Disposition: Followup with Dr. Lauree Chandler in 3 mos.     Signed, Versie Starks, MHS 07/13/2014 10:46 AM    [facility] [address], [place], [place]  [address] Phone: [phone]; Fax: [phone]

## 2014-07-13 NOTE — Telephone Encounter (Signed)
pt notified about lab results with verbal understanding  

## 2014-07-13 NOTE — Patient Instructions (Signed)
WEAR COMPRESSION STOCKINGS DAILY  TRY TAKING LASIX 40 MG ON ALTERNATE DAYS OF 20 MG FOR 1 WEEK; THEN AFTER 1 WEEK RESUME YOUR REGULAR DOSE  LAB WORK TODAY; BMET  Your physician has requested that you have an echocardiogram. Echocardiography is a painless test that uses sound waves to create images of your heart. It provides your doctor with information about the size and shape of your heart and how well your heart's chambers and valves are working. This procedure takes approximately one hour. There are no restrictions for this procedure.  Your physician recommends that you schedule a follow-up appointment in: [name]. Angelena Form

## 2014-07-19 ENCOUNTER — Other Ambulatory Visit: Payer: Self-pay | Admitting: Cardiovascular Disease

## 2014-08-05 ENCOUNTER — Telehealth: Payer: Self-pay | Admitting: Cardiovascular Disease

## 2014-08-05 NOTE — Telephone Encounter (Signed)
Started on Prednisone taper for shingles on 08/04/14 6-5-4-3-2-1-off.  Made OV to check INR on 08/06/14.  Pt aware Prednisone can interact with Coumadin and INR needs to be checked sooner.  Aware of appt date and time.

## 2014-08-05 NOTE — Telephone Encounter (Signed)
New message     Pt was prescribed prednesone ,veltrex 1gm, gabapentin 300mg  and off of cardizem and started losartan 25mg  on the 3rd of august.  She is on coumadin.  Pt has shingles.  Will she need her coumadin adjusted?

## 2014-08-06 ENCOUNTER — Ambulatory Visit (INDEPENDENT_AMBULATORY_CARE_PROVIDER_SITE_OTHER): Payer: Medicare Other | Admitting: Pharmacist

## 2014-08-06 DIAGNOSIS — I4891 Unspecified atrial fibrillation: Secondary | ICD-10-CM

## 2014-08-06 DIAGNOSIS — Z5181 Encounter for therapeutic drug level monitoring: Secondary | ICD-10-CM

## 2014-08-06 DIAGNOSIS — Z7901 Long term (current) use of anticoagulants: Secondary | ICD-10-CM

## 2014-08-06 LAB — POCT INR: INR: 3.3

## 2014-08-17 ENCOUNTER — Ambulatory Visit (HOSPITAL_COMMUNITY): Payer: Medicare Other | Attending: Cardiology | Admitting: Radiology

## 2014-08-17 ENCOUNTER — Ambulatory Visit (INDEPENDENT_AMBULATORY_CARE_PROVIDER_SITE_OTHER): Payer: Medicare Other | Admitting: *Deleted

## 2014-08-17 DIAGNOSIS — I4891 Unspecified atrial fibrillation: Secondary | ICD-10-CM | POA: Insufficient documentation

## 2014-08-17 DIAGNOSIS — Z5181 Encounter for therapeutic drug level monitoring: Secondary | ICD-10-CM

## 2014-08-17 DIAGNOSIS — R609 Edema, unspecified: Secondary | ICD-10-CM | POA: Diagnosis present

## 2014-08-17 DIAGNOSIS — Z7901 Long term (current) use of anticoagulants: Secondary | ICD-10-CM

## 2014-08-17 DIAGNOSIS — I059 Rheumatic mitral valve disease, unspecified: Secondary | ICD-10-CM | POA: Insufficient documentation

## 2014-08-17 DIAGNOSIS — I340 Nonrheumatic mitral (valve) insufficiency: Secondary | ICD-10-CM

## 2014-08-17 LAB — POCT INR: INR: 3.7

## 2014-08-17 NOTE — Progress Notes (Signed)
Echocardiogram performed.  

## 2014-08-21 ENCOUNTER — Encounter: Payer: Self-pay | Admitting: Physician Assistant

## 2014-08-24 ENCOUNTER — Telehealth: Payer: Self-pay | Admitting: *Deleted

## 2014-08-24 NOTE — Telephone Encounter (Signed)
pt notified about echo results and a f/u appt was made for 11/23 with Dr. Angelena Form. Pt verbalized understanding to Plan of Care.

## 2014-08-31 ENCOUNTER — Ambulatory Visit (INDEPENDENT_AMBULATORY_CARE_PROVIDER_SITE_OTHER): Payer: Medicare Other | Admitting: Pharmacist Clinician (PhC)/ Clinical Pharmacy Specialist

## 2014-08-31 DIAGNOSIS — Z7901 Long term (current) use of anticoagulants: Secondary | ICD-10-CM

## 2014-08-31 DIAGNOSIS — Z5181 Encounter for therapeutic drug level monitoring: Secondary | ICD-10-CM

## 2014-08-31 DIAGNOSIS — I4891 Unspecified atrial fibrillation: Secondary | ICD-10-CM

## 2014-08-31 LAB — POCT INR: INR: 4.5

## 2014-09-14 ENCOUNTER — Ambulatory Visit (INDEPENDENT_AMBULATORY_CARE_PROVIDER_SITE_OTHER): Payer: Medicare Other | Admitting: *Deleted

## 2014-09-14 DIAGNOSIS — I4891 Unspecified atrial fibrillation: Secondary | ICD-10-CM

## 2014-09-14 DIAGNOSIS — Z5181 Encounter for therapeutic drug level monitoring: Secondary | ICD-10-CM

## 2014-09-14 DIAGNOSIS — Z7901 Long term (current) use of anticoagulants: Secondary | ICD-10-CM

## 2014-09-14 LAB — POCT INR: INR: 3.1

## 2014-09-30 ENCOUNTER — Ambulatory Visit (INDEPENDENT_AMBULATORY_CARE_PROVIDER_SITE_OTHER): Payer: Medicare Other | Admitting: *Deleted

## 2014-09-30 DIAGNOSIS — I4891 Unspecified atrial fibrillation: Secondary | ICD-10-CM

## 2014-09-30 DIAGNOSIS — Z7901 Long term (current) use of anticoagulants: Secondary | ICD-10-CM

## 2014-09-30 DIAGNOSIS — Z5181 Encounter for therapeutic drug level monitoring: Secondary | ICD-10-CM

## 2014-09-30 LAB — POCT INR: INR: 2.7

## 2014-10-21 ENCOUNTER — Ambulatory Visit (INDEPENDENT_AMBULATORY_CARE_PROVIDER_SITE_OTHER): Payer: Medicare Other | Admitting: Pharmacist Clinician (PhC)/ Clinical Pharmacy Specialist

## 2014-10-21 DIAGNOSIS — Z5181 Encounter for therapeutic drug level monitoring: Secondary | ICD-10-CM

## 2014-10-21 DIAGNOSIS — I4891 Unspecified atrial fibrillation: Secondary | ICD-10-CM

## 2014-10-21 DIAGNOSIS — Z7901 Long term (current) use of anticoagulants: Secondary | ICD-10-CM

## 2014-10-21 LAB — POCT INR: INR: 3.4

## 2014-11-03 ENCOUNTER — Other Ambulatory Visit: Payer: Self-pay

## 2014-11-03 DIAGNOSIS — Z1231 Encounter for screening mammogram for malignant neoplasm of breast: Secondary | ICD-10-CM

## 2014-11-04 ENCOUNTER — Ambulatory Visit (INDEPENDENT_AMBULATORY_CARE_PROVIDER_SITE_OTHER): Payer: Medicare Other | Admitting: *Deleted

## 2014-11-04 DIAGNOSIS — Z7901 Long term (current) use of anticoagulants: Secondary | ICD-10-CM

## 2014-11-04 DIAGNOSIS — I4891 Unspecified atrial fibrillation: Secondary | ICD-10-CM

## 2014-11-04 DIAGNOSIS — Z5181 Encounter for therapeutic drug level monitoring: Secondary | ICD-10-CM

## 2014-11-04 LAB — POCT INR: INR: 2.1

## 2014-11-22 ENCOUNTER — Ambulatory Visit (INDEPENDENT_AMBULATORY_CARE_PROVIDER_SITE_OTHER): Payer: Medicare Other | Admitting: Cardiovascular Disease

## 2014-11-22 ENCOUNTER — Encounter: Payer: Self-pay | Admitting: Cardiovascular Disease

## 2014-11-22 ENCOUNTER — Ambulatory Visit (INDEPENDENT_AMBULATORY_CARE_PROVIDER_SITE_OTHER): Payer: Medicare Other | Admitting: Pharmacist

## 2014-11-22 ENCOUNTER — Other Ambulatory Visit: Payer: Self-pay

## 2014-11-22 VITALS — BP 120/70 | HR 94 | Ht 63.0 in | Wt 187.0 lb

## 2014-11-22 DIAGNOSIS — I480 Paroxysmal atrial fibrillation: Secondary | ICD-10-CM

## 2014-11-22 DIAGNOSIS — Z5181 Encounter for therapeutic drug level monitoring: Secondary | ICD-10-CM

## 2014-11-22 DIAGNOSIS — Z7901 Long term (current) use of anticoagulants: Secondary | ICD-10-CM

## 2014-11-22 DIAGNOSIS — I340 Nonrheumatic mitral (valve) insufficiency: Secondary | ICD-10-CM

## 2014-11-22 DIAGNOSIS — I4891 Unspecified atrial fibrillation: Secondary | ICD-10-CM

## 2014-11-22 DIAGNOSIS — I5032 Chronic diastolic (congestive) heart failure: Secondary | ICD-10-CM

## 2014-11-22 LAB — POCT INR: INR: 2.3

## 2014-11-22 MED ORDER — METOPROLOL SUCCINATE ER 25 MG PO TB24: 25.0000 mg | ORAL_TABLET | Freq: Every day | ORAL | Status: DC

## 2014-11-22 NOTE — Progress Notes (Signed)
History of Present Illness: 80 yo WF with history of colon cancer, hypothyroidism, HLD, HTN, hemochromatosis, paroxysmal atrial fibrillation here today for cardiac follow up. When I met her in September of 2012, she was in sinus rhythm. She has been continued on coumadin. Echo September 2013 showed mild AI, moderate MR, normal LV function. She underwent laparoscopic lysis of adhesions, biopsy of the liver mass, a laparoscopic right colectomy on April 09, 2011 per Dr. Dalbert Batman. Final pathology report showed a benign tubular adenoma of the colon, a degenerating hyalinized cyst of the liver. She was seen by Richardson Dopp, PA-C July 2015 and c/o LE edema. Lasix was increased. Echo 08/07/14 with LVEF=50%, mild AI, mild to moderate MR.    She is here today for cardiac follow up. She is feeling well. No chest pain or SOB. No dizziness, near syncope or syncope.   Primary Care Physician: Dr. Lorene Dy.  Last Lipid Profile:Lipid Panel     Component Value Date/Time   CHOL 153 08/29/2011 0235   TRIG 80 08/29/2011 0235   HDL 64 08/29/2011 0235   CHOLHDL 2.4 08/29/2011 0235   VLDL 16 08/29/2011 0235   LDLCALC 73 08/29/2011 0235     Past Medical History  Diagnosis Date  . HTN (hypertension)   . Environmental allergies   . Arthritis   . Thyroid disease   . History of colon cancer   . Hemochromatosis 1998  . Atrial fibrillation   . Constipation   . Arthritis pain   . Cancer   . PONV (postoperative nausea and vomiting)   . Varicose vein of leg   . Hx of echocardiogram     Echo (8/15):  EF 45-50%, diff HK, mild AI, MAC, mild to mod MR, severe LAE, mod RAE, PASP 32 mmHg    Past Surgical History  Procedure Laterality Date  . Colon surgery  1996    CANCER REMOVAL  . Total hip arthroplasty  2004    RIGHT  . Gallbladder surgery  2005  . Breast surgery  1991    Rt br lump removed-benign  . Liver biopsy  04/08/2012    Procedure: LIVER BIOPSY;  Surgeon: Adin Hector, MD;  Location: [facility];   Service: General;;  laparoscopic    Current Outpatient Prescriptions  Medication Sig Dispense Refill  . acetaminophen (TYLENOL) 500 MG tablet Take 500 mg by mouth every 6 (six) hours as needed. For pain    . atorvastatin (LIPITOR) 10 MG tablet Take 10 mg by mouth Daily.     . furosemide (LASIX) 20 MG tablet Take 20 mg by mouth Daily. EXTRA 20 MG PRN FOR EDEMA PT STATES PER PCP    . gabapentin (NEURONTIN) 100 MG capsule Take 100 mg by mouth 3 (three) times daily.    . Glucosamine-Chondroitin (MOVE FREE PO) Take 1 tablet by mouth 2 (two) times daily.    Marland Kitchen guaiFENesin (MUCINEX) 600 MG 12 hr tablet Take 1 tablet (600 mg total) by mouth 2 (two) times daily. 14 tablet 0  . levothyroxine (SYNTHROID, LEVOTHROID) 50 MCG tablet Take 25 mcg by mouth Daily.     Marland Kitchen losartan (COZAAR) 25 MG tablet     . meclizine (ANTIVERT) 25 MG tablet Take 25 mg by mouth 2 (two) times daily as needed for dizziness.     . Misc Natural Products (OSTEO BI-FLEX ADV TRIPLE ST PO) Take by mouth.    . Omega-3 Fatty Acids (FISH OIL) 1200 MG CAPS Take 1 capsule by  mouth daily.     . polyethylene glycol (MIRALAX / GLYCOLAX) packet Take 17 g by mouth daily as needed. For constipation    . senna (SENOKOT) 8.6 MG TABS Take 1 tablet by mouth daily as needed. For constipation    . tretinoin (RETIN-A) 0.05 % cream   2  . vitamin B-12 (CYANOCOBALAMIN) 100 MCG tablet Take 50 mcg by mouth daily.      . vitamin E 400 UNIT capsule Take 400 Units by mouth daily.      . warfarin (COUMADIN) 3 MG tablet TAKE AS DIRECTED BY  ANTICOAGULATION  CLINIC 90 tablet 1   No current facility-administered medications for this visit.    Allergies  Allergen Reactions  . Cardizem [Diltiazem Hcl] Cough  . Adhesive [Tape] Rash  . Latex Rash    Per Patient Latex=lip swelling    History   Social History  . Marital Status: Married    Spouse Name: N/A    Number of Children: N/A  . Years of Education: N/A   Occupational History  . RETIRED [employer]    Social History Main Topics  . Smoking status: Never Smoker   . Smokeless tobacco: Not on file  . Alcohol Use: No  . Drug Use: No  . Sexual Activity: Not on file   Other Topics Concern  . Not on file   Social History Narrative    Family History  Problem Relation Age of Onset  . Heart disease Mother   . Stroke Mother   . Heart disease Father   . Pneumonia Brother     Review of Systems:  As stated in the HPI and otherwise negative.   BP 120/70 mmHg  Pulse 94  Ht 5' 3" (1.6 m)  Wt 187 lb (84.823 kg)  BMI 33.13 kg/m2  SpO2 97%  Physical Examination: General: Well developed, well nourished, NAD HEENT: OP clear, mucus membranes moist SKIN: warm, dry. No rashes. Neuro: No focal deficits Musculoskeletal: Muscle strength 5/5 all ext Psychiatric: Mood and affect normal Neck: No JVD, no carotid bruits, no thyromegaly, no lymphadenopathy. Lungs:Clear bilaterally, no wheezes, rhonci, crackles Cardiovascular: Irregular irregular. No murmurs, gallops or rubs. Abdomen:Soft. Bowel sounds present. Non-tender.  Extremities: No lower extremity edema. Pulses are 2 + in the bilateral DP/PT.  Echo 08/07/14: Left ventricle: The cavity size was normal. Wall thickness was normal. Indeterminant diastolic function (atrial fibrillation). Systolic function was mildly reduced. The estimated ejection fraction was in the range of 45% to 50%. Diffuse hypokinesis. - Aortic valve: There was no stenosis. There was mild regurgitation. - Mitral valve: Mildly calcified annulus. Mildly calcified leaflets . There was mild to moderate regurgitation. - Left atrium: The atrium was severely dilated. - Right ventricle: The cavity size was normal. Systolic function was normal. - Right atrium: The atrium was moderately dilated. - Tricuspid valve: Peak RV-RA gradient (S): 29 mm Hg. - Pulmonary arteries: PA peak pressure: 32 mm Hg (S). - Inferior vena cava: The vessel was normal in size.  The respirophasic diameter changes were in the normal range (>= 50%), consistent with normal central venous pressure.  Impressions:  - The patient was in atrial fibrillation. Normal LV size with mild global hypokinesis, EF 45-50%. Severely dilated LA, moderately dilated RA. Normal RV size and systolic function. Mild to moderate MR. Mild AI.   Assessment and Plan:   1. Atrial fibrillation: Rate controlled. She appears to be in atrial fibrillation today. No palpitations at home. Continue coumadin. Cardizem stopped due to   rash, dark spots on face and cough. Now on non rate control agent. Will start Toprol XL 25 mg po Qdaily.   2.  Mitral regurgitation: Moderate MR by echo August 2015.   3. Chronic diastolic CHF: Volume stable with daily Lasix.   

## 2014-11-22 NOTE — Patient Instructions (Signed)
Your physician wants you to follow-up in:  6 months. You will receive a reminder letter in the mail two months in advance. If you don't receive a letter, please call our office to schedule the follow-up appointment.  Your physician has recommended you make the following change in your medication:  Start Toprol 25 mg by mouth daily.

## 2014-11-24 ENCOUNTER — Ambulatory Visit
Admission: RE | Admit: 2014-11-24 | Discharge: 2014-11-24 | Disposition: A | Discharge status: Home / Self Care | Payer: Medicare Other | Source: Ambulatory Visit

## 2014-11-24 DIAGNOSIS — Z1231 Encounter for screening mammogram for malignant neoplasm of breast: Secondary | ICD-10-CM

## 2014-12-20 ENCOUNTER — Ambulatory Visit (INDEPENDENT_AMBULATORY_CARE_PROVIDER_SITE_OTHER): Payer: Medicare Other | Admitting: Pharmacist

## 2014-12-20 DIAGNOSIS — I480 Paroxysmal atrial fibrillation: Secondary | ICD-10-CM

## 2014-12-20 DIAGNOSIS — I4891 Unspecified atrial fibrillation: Secondary | ICD-10-CM

## 2014-12-20 DIAGNOSIS — Z5181 Encounter for therapeutic drug level monitoring: Secondary | ICD-10-CM

## 2014-12-20 DIAGNOSIS — Z7901 Long term (current) use of anticoagulants: Secondary | ICD-10-CM

## 2014-12-20 LAB — POCT INR: INR: 2.2

## 2015-01-19 ENCOUNTER — Ambulatory Visit (INDEPENDENT_AMBULATORY_CARE_PROVIDER_SITE_OTHER): Payer: Medicare Other | Admitting: *Deleted

## 2015-01-19 DIAGNOSIS — I480 Paroxysmal atrial fibrillation: Secondary | ICD-10-CM

## 2015-01-19 DIAGNOSIS — I4891 Unspecified atrial fibrillation: Secondary | ICD-10-CM

## 2015-01-19 DIAGNOSIS — Z7901 Long term (current) use of anticoagulants: Secondary | ICD-10-CM

## 2015-01-19 DIAGNOSIS — Z5181 Encounter for therapeutic drug level monitoring: Secondary | ICD-10-CM

## 2015-01-19 LAB — POCT INR: INR: 2.9

## 2015-01-26 ENCOUNTER — Ambulatory Visit (INDEPENDENT_AMBULATORY_CARE_PROVIDER_SITE_OTHER): Payer: Medicare Other | Admitting: *Deleted

## 2015-01-26 DIAGNOSIS — I480 Paroxysmal atrial fibrillation: Secondary | ICD-10-CM

## 2015-01-26 DIAGNOSIS — Z5181 Encounter for therapeutic drug level monitoring: Secondary | ICD-10-CM

## 2015-01-26 DIAGNOSIS — Z7901 Long term (current) use of anticoagulants: Secondary | ICD-10-CM

## 2015-01-26 DIAGNOSIS — I4891 Unspecified atrial fibrillation: Secondary | ICD-10-CM

## 2015-01-26 LAB — POCT INR: INR: 1.7

## 2015-02-09 ENCOUNTER — Ambulatory Visit (INDEPENDENT_AMBULATORY_CARE_PROVIDER_SITE_OTHER): Payer: Medicare Other | Admitting: Pharmacist

## 2015-02-09 DIAGNOSIS — I480 Paroxysmal atrial fibrillation: Secondary | ICD-10-CM

## 2015-02-09 DIAGNOSIS — Z5181 Encounter for therapeutic drug level monitoring: Secondary | ICD-10-CM

## 2015-02-09 DIAGNOSIS — Z7901 Long term (current) use of anticoagulants: Secondary | ICD-10-CM

## 2015-02-09 DIAGNOSIS — I4891 Unspecified atrial fibrillation: Secondary | ICD-10-CM

## 2015-02-09 LAB — POCT INR: INR: 2

## 2015-02-10 ENCOUNTER — Telehealth: Payer: Self-pay | Admitting: *Deleted

## 2015-02-10 NOTE — Telephone Encounter (Signed)
Received staff message to call [name] regarding question about Toprol. I spoke with [name] who reports she has recently been treated for bronchitis, shingles and pneumonia.  Is now recovered from these.  She reports she is now having episodes of weak spells.  Started in last week. Has been on Toprol since office visit in November and no problems until recently. Weak spells do not happen every day. Has not passed out. Will sometimes feel dizzy when standing other times happens when sitting. Yesterday felt weak when eating. Feels good today. No chest pain. No shortness of breath. Heart rate is regular and not fast.  Has not seen primary care since November.  I asked her to call primary care to have these symptoms evaluated. [name] has appt with Richardson Dopp, PA on March 02, 2015. I offered [name] earlier appt but she did not want to schedule at this time.

## 2015-02-10 NOTE — Telephone Encounter (Signed)
thanks

## 2015-03-02 ENCOUNTER — Encounter: Payer: Self-pay | Admitting: Physician Assistant

## 2015-03-02 ENCOUNTER — Ambulatory Visit (INDEPENDENT_AMBULATORY_CARE_PROVIDER_SITE_OTHER): Payer: Medicare Other | Admitting: Pharmacist

## 2015-03-02 ENCOUNTER — Ambulatory Visit (INDEPENDENT_AMBULATORY_CARE_PROVIDER_SITE_OTHER): Payer: Medicare Other | Admitting: Physician Assistant

## 2015-03-02 VITALS — BP 120/78 | HR 77 | Ht 63.0 in | Wt 185.0 lb

## 2015-03-02 DIAGNOSIS — E785 Hyperlipidemia, unspecified: Secondary | ICD-10-CM

## 2015-03-02 DIAGNOSIS — I480 Paroxysmal atrial fibrillation: Secondary | ICD-10-CM

## 2015-03-02 DIAGNOSIS — I340 Nonrheumatic mitral (valve) insufficiency: Secondary | ICD-10-CM

## 2015-03-02 DIAGNOSIS — I5032 Chronic diastolic (congestive) heart failure: Secondary | ICD-10-CM

## 2015-03-02 DIAGNOSIS — R5383 Other fatigue: Secondary | ICD-10-CM

## 2015-03-02 DIAGNOSIS — Z7901 Long term (current) use of anticoagulants: Secondary | ICD-10-CM

## 2015-03-02 DIAGNOSIS — Z5181 Encounter for therapeutic drug level monitoring: Secondary | ICD-10-CM

## 2015-03-02 DIAGNOSIS — I4891 Unspecified atrial fibrillation: Secondary | ICD-10-CM

## 2015-03-02 DIAGNOSIS — I482 Chronic atrial fibrillation, unspecified: Secondary | ICD-10-CM

## 2015-03-02 DIAGNOSIS — I10 Essential (primary) hypertension: Secondary | ICD-10-CM

## 2015-03-02 LAB — BASIC METABOLIC PANEL
BUN: 22 mg/dL (ref 6–23)
CHLORIDE: 105 meq/L (ref 96–112)
CO2: 29 mEq/L (ref 19–32)
CREATININE: 0.89 mg/dL (ref 0.40–1.20)
Calcium: 10.5 mg/dL (ref 8.4–10.5)
GFR: 64.34 mL/min (ref >60.00)
GLUCOSE: 87 mg/dL (ref 70–99)
POTASSIUM: 4.1 meq/L (ref 3.5–5.1)
Sodium: 141 mEq/L (ref 135–145)

## 2015-03-02 LAB — CBC WITH DIFFERENTIAL/PLATELET
BASOS ABS: 0 10*3/uL (ref 0.0–0.1)
Basophils Relative: 0.5 % (ref 0.0–3.0)
Eosinophils Absolute: 0 10*3/uL (ref 0.0–0.7)
Eosinophils Relative: 0.5 % (ref 0.0–5.0)
HEMATOCRIT: 43.8 % (ref 36.0–46.0)
Hemoglobin: 14.9 g/dL (ref 12.0–15.0)
Lymphocytes Relative: 44.5 % (ref 12.0–46.0)
Lymphs Abs: 3.6 10*3/uL (ref 0.7–4.0)
MCHC: 34.1 g/dL (ref 30.0–36.0)
MCV: 97.3 fl (ref 78.0–100.0)
MONOS PCT: 5.3 % (ref 3.0–12.0)
Monocytes Absolute: 0.4 10*3/uL (ref 0.1–1.0)
Neutro Abs: 4 10*3/uL (ref 1.4–7.7)
Neutrophils Relative %: 49.2 % (ref 43.0–77.0)
PLATELETS: 175 10*3/uL (ref 150.0–400.0)
RBC: 4.5 Mil/uL (ref 3.87–5.11)
RDW: 13.6 % (ref 11.5–15.5)
WBC: 8.1 10*3/uL (ref 4.0–10.5)

## 2015-03-02 LAB — POCT INR: INR: 2.5

## 2015-03-02 LAB — TSH: TSH: 2.15 u[IU]/mL (ref 0.35–4.50)

## 2015-03-02 NOTE — Progress Notes (Signed)
Cardiology Office Note   Date:  03/02/2015   ID:  [name], DOB Oct 09, 1929, MRN [id number]  PCP:  Myriam Jacobson, MD  Cardiologist:  Dr. Lauree Chandler     Chief Complaint  Patient presents with  . Atrial Fibrillation     History of Present Illness: [name] is a 81 y.o. female with a hx of colon CA, hypothyroidism, HTN, HL, hemachromatosis, mitral regurgitation and atrial fibrillation. She is on Coumadin for anticoagulation. Last seen by Dr. Lauree Chandler in 10/2014. Diltiazem as DC'd 2/2 rash.  Toprol-XL was added for rate control.    Echo in 07/2014 with EF 50% and mild to mod MR.    She called in recently with weakness. She has had 2 bouts of the shingles and a recent episode of bronchitis that progressed to pneumonia.  She finished up her antibiotics and her cough resolved.  She then started to feel weak, especially after eating.  She denies syncope or near syncope.  She denies chest pain, significant dyspnea, orthopnea, PND, edema.  This lasted about 2 weeks.  She feels back to her baseline now.   Studies: - Echo (09/23/12): EF 55-60%, normal wall motion, grade 1 diastolic dysfunction, mild AI, moderate MR, moderate LAE, PASP 32 mm Hg  - Echo (8/15):  EF 45-50%, diff HK, mild AI, mild to mod MR, severe LAE, mod RAE, PASP 32 mmHg,    Past Medical History  Diagnosis Date  . HTN (hypertension)   . Environmental allergies   . Arthritis   . Thyroid disease   . History of colon cancer   . Hemochromatosis 1998  . Atrial fibrillation   . Constipation   . Arthritis pain   . Cancer   . PONV (postoperative nausea and vomiting)   . Varicose vein of leg   . Hx of echocardiogram     Echo (8/15):  EF 45-50%, diff HK, mild AI, MAC, mild to mod MR, severe LAE, mod RAE, PASP 32 mmHg    Past Surgical History  Procedure Laterality Date  . Colon surgery  1996    CANCER REMOVAL  . Total hip arthroplasty  2004    RIGHT  . Gallbladder surgery  2005    . Breast surgery  1991    Rt br lump removed-benign  . Liver biopsy  04/08/2012    Procedure: LIVER BIOPSY;  Surgeon: Adin Hector, MD;  Location: [facility];  Service: General;;  laparoscopic     Current Outpatient Prescriptions  Medication Sig Dispense Refill  . acetaminophen (TYLENOL) 500 MG tablet Take 500 mg by mouth every 6 (six) hours as needed. For pain    . atorvastatin (LIPITOR) 10 MG tablet Take 10 mg by mouth Daily.     . furosemide (LASIX) 20 MG tablet Take 20 mg by mouth Daily. EXTRA 20 MG PRN FOR EDEMA PT STATES PER PCP    . Glucosamine-Chondroitin (MOVE FREE PO) Take 1 tablet by mouth 2 (two) times daily.    Marland Kitchen guaiFENesin (MUCINEX) 600 MG 12 hr tablet Take 1 tablet (600 mg total) by mouth 2 (two) times daily. (Patient taking differently: Take 600 mg by mouth 2 (two) times daily as needed for cough. ) 14 tablet 0  . levothyroxine (SYNTHROID, LEVOTHROID) 50 MCG tablet Take 25 mcg by mouth Daily.     Marland Kitchen losartan (COZAAR) 25 MG tablet     . meclizine (ANTIVERT) 25 MG tablet Take 25 mg by mouth 2 (two) times  daily as needed for dizziness.     . metoprolol succinate (TOPROL XL) 25 MG 24 hr tablet Take 1 tablet (25 mg total) by mouth daily. 30 tablet 11  . Misc Natural Products (OSTEO BI-FLEX ADV TRIPLE ST PO) Take by mouth.    . Omega-3 Fatty Acids (FISH OIL) 1200 MG CAPS Take 1 capsule by mouth daily.     . polyethylene glycol (MIRALAX / GLYCOLAX) packet Take 17 g by mouth daily as needed. For constipation    . senna (SENOKOT) 8.6 MG TABS Take 1 tablet by mouth daily as needed. For constipation    . tretinoin (RETIN-A) 0.05 % cream   2  . vitamin B-12 (CYANOCOBALAMIN) 100 MCG tablet Take 50 mcg by mouth daily.      . vitamin E 400 UNIT capsule Take 400 Units by mouth daily.      Marland Kitchen warfarin (COUMADIN) 3 MG tablet TAKE AS DIRECTED BY  ANTICOAGULATION  CLINIC 90 tablet 1   No current facility-administered medications for this visit.    Allergies:   Cardizem; Adhesive; and Latex     Social History:  The patient  reports that she has never smoked. She does not have any smokeless tobacco history on file. She reports that she does not drink alcohol or use illicit drugs.   Family History:  The patient's family history includes Heart disease in her father and mother; Hypertension in her maternal grandfather; Pneumonia in her brother; Stroke in her mother. There is no history of Heart attack.    ROS:   Please see the history of present illness.   Review of Systems  HENT: Positive for hearing loss.   Respiratory: Positive for cough.   Neurological: Positive for loss of balance.  All other systems reviewed and are negative.    PHYSICAL EXAM: VS:  BP 120/78 mmHg  Pulse 77  Ht 5\' 3"  (1.6 m)  Wt 185 lb (83.915 kg)  BMI 32.78 kg/m2    Wt Readings from Last 3 Encounters:  03/02/15 185 lb (83.915 kg)  11/22/14 187 lb (84.823 kg)  07/13/14 187 lb (84.823 kg)     GEN: Well nourished, well developed, in no acute distress HEENT: normal Neck: no JVD, no masses Cardiac:  Normal S1/S2, irreg irreg; no murmur, no rubs or gallops, trace edema  Respiratory:  clear to auscultation bilaterally, no wheezing, rhonchi or rales. GI: soft, nontender, nondistended, + BS MS: no deformity or atrophy Skin: warm and dry  Neuro:  CNs II-XII intact, Strength and sensation are intact Psych: Normal affect   EKG:  EKG is ordered today.  It demonstrates:   AFib, HR 77, LAD, no significant change since prior tracing   Recent Labs: 07/13/2014: BUN 15; Creatinine 0.8; Potassium 3.6; Sodium 138    Lipid Panel    Component Value Date/Time   CHOL 153 08/29/2011 0235   TRIG 80 08/29/2011 0235   HDL 64 08/29/2011 0235   CHOLHDL 2.4 08/29/2011 0235   VLDL 16 08/29/2011 0235   LDLCALC 73 08/29/2011 0235      ASSESSMENT AND PLAN:  1.  Fatigue:  I suspect she was somewhat dehydrated from her recent illness.  She is improved now.  Still feels a little weak.  Labs today:  BMET, CBC,  TSH.   2.  Atrial Fibrillation:  Rate controlled.  Continue coumadin. 3.  Mitral Regurgitation:  Clinically stable.  Moderate MR by echo 8/15.   4.  HTN:  Controlled. 5.  Pneumonia:  Resolved.  She has noted a cough recently.  I advised her to FU with primary care if this continues. 6.  Hyperlipidemia:  Continue statin. 7.  Chronic Diastolic CHF:  Volume appears stable.  Continue current Rx.    Current medicines are reviewed at length with the patient today.  The patient does not have concerns regarding medicines.  The following changes have been made:  As above.  Labs/ tests ordered today include:   Orders Placed This Encounter  Procedures  . Basic metabolic panel  . CBC with Differential/Platelet  . TSH    Disposition:   FU with Dr. Lauree Chandler  in May 2016 as planned.   Signed, Versie Starks, MHS 03/02/2015 12:28 PM    [facility] [place], [place], [place]  [address] Phone: [phone]; Fax: [phone]

## 2015-03-02 NOTE — Patient Instructions (Signed)
Labs today:  BMET, CBC, TSH.  If your cough continues, follow up with the provider that treated your pneumonia so you can get your chest xray repeated.  Keep your follow up with Dr. Lauree Chandler in May 2016.

## 2015-03-30 ENCOUNTER — Ambulatory Visit (INDEPENDENT_AMBULATORY_CARE_PROVIDER_SITE_OTHER): Payer: Medicare Other | Admitting: *Deleted

## 2015-03-30 DIAGNOSIS — I4891 Unspecified atrial fibrillation: Secondary | ICD-10-CM | POA: Diagnosis not present

## 2015-03-30 DIAGNOSIS — I480 Paroxysmal atrial fibrillation: Secondary | ICD-10-CM | POA: Diagnosis not present

## 2015-03-30 DIAGNOSIS — Z7901 Long term (current) use of anticoagulants: Secondary | ICD-10-CM

## 2015-03-30 DIAGNOSIS — Z5181 Encounter for therapeutic drug level monitoring: Secondary | ICD-10-CM

## 2015-03-30 LAB — POCT INR: INR: 1.9

## 2015-04-11 ENCOUNTER — Other Ambulatory Visit: Payer: Self-pay | Admitting: Cardiovascular Disease

## 2015-04-27 ENCOUNTER — Ambulatory Visit (INDEPENDENT_AMBULATORY_CARE_PROVIDER_SITE_OTHER): Payer: Medicare Other | Admitting: *Deleted

## 2015-04-27 DIAGNOSIS — Z5181 Encounter for therapeutic drug level monitoring: Secondary | ICD-10-CM

## 2015-04-27 DIAGNOSIS — I4891 Unspecified atrial fibrillation: Secondary | ICD-10-CM

## 2015-04-27 DIAGNOSIS — Z7901 Long term (current) use of anticoagulants: Secondary | ICD-10-CM

## 2015-04-27 DIAGNOSIS — I480 Paroxysmal atrial fibrillation: Secondary | ICD-10-CM

## 2015-04-27 LAB — POCT INR: INR: 2.4

## 2015-05-17 ENCOUNTER — Ambulatory Visit (INDEPENDENT_AMBULATORY_CARE_PROVIDER_SITE_OTHER): Payer: Medicare Other | Admitting: *Deleted

## 2015-05-17 DIAGNOSIS — I4891 Unspecified atrial fibrillation: Secondary | ICD-10-CM

## 2015-05-17 DIAGNOSIS — I480 Paroxysmal atrial fibrillation: Secondary | ICD-10-CM | POA: Diagnosis not present

## 2015-05-17 DIAGNOSIS — Z7901 Long term (current) use of anticoagulants: Secondary | ICD-10-CM

## 2015-05-17 DIAGNOSIS — Z5181 Encounter for therapeutic drug level monitoring: Secondary | ICD-10-CM

## 2015-05-17 LAB — POCT INR: INR: 2.8

## 2015-05-20 ENCOUNTER — Ambulatory Visit (INDEPENDENT_AMBULATORY_CARE_PROVIDER_SITE_OTHER): Payer: Medicare Other | Admitting: Cardiovascular Disease

## 2015-05-20 ENCOUNTER — Encounter: Payer: Self-pay | Admitting: Cardiovascular Disease

## 2015-05-20 VITALS — BP 128/70 | HR 66 | Ht 61.0 in | Wt 194.0 lb

## 2015-05-20 DIAGNOSIS — I5032 Chronic diastolic (congestive) heart failure: Secondary | ICD-10-CM

## 2015-05-20 DIAGNOSIS — I4819 Other persistent atrial fibrillation: Secondary | ICD-10-CM

## 2015-05-20 DIAGNOSIS — I340 Nonrheumatic mitral (valve) insufficiency: Secondary | ICD-10-CM | POA: Diagnosis not present

## 2015-05-20 DIAGNOSIS — I481 Persistent atrial fibrillation: Secondary | ICD-10-CM | POA: Diagnosis not present

## 2015-05-20 MED ORDER — METOPROLOL SUCCINATE ER 25 MG PO TB24: 25.0000 mg | ORAL_TABLET | Freq: Every day | ORAL | Status: AC

## 2015-05-20 NOTE — Progress Notes (Signed)
No chief complaint on file. joint aches  History of Present Illness: [name] with history of colon cancer, hypothyroidism, HLD, HTN, hemochromatosis, paroxysmal atrial fibrillation here today for cardiac follow up. When I met her in September of 2012, she was in sinus rhythm. She has been continued on coumadin. Echo September 2013 showed mild AI, moderate MR, normal LV function. She underwent laparoscopic lysis of adhesions, biopsy of the liver mass, a laparoscopic right colectomy on April 09, 2011 per Dr. Dalbert Batman. Final pathology report showed a benign tubular adenoma of the colon, a degenerating hyalinized cyst of the liver. She was seen by Richardson Dopp, PA-C July 2015 and c/o LE edema. Lasix was increased. Echo 08/07/14 with LVEF=50%, mild AI, mild to moderate MR.    She is here today for cardiac follow up. She is feeling well. No chest pain or SOB. No dizziness, near syncope or syncope.    Primary Care Physician: Dr. Lorene Dy.  Last Lipid Profile:Lipid Panel     Component Value Date/Time   CHOL 153 08/29/2011 0235   TRIG 80 08/29/2011 0235   HDL 64 08/29/2011 0235   CHOLHDL 2.4 08/29/2011 0235   VLDL 16 08/29/2011 0235   LDLCALC 73 08/29/2011 0235     Past Medical History  Diagnosis Date  . HTN (hypertension)   . Environmental allergies   . Arthritis   . Thyroid disease   . History of colon cancer   . Hemochromatosis 1998  . Atrial fibrillation   . Constipation   . Arthritis pain   . Cancer   . PONV (postoperative nausea and vomiting)   . Varicose vein of leg   . Hx of echocardiogram     Echo (8/15):  EF 45-50%, diff HK, mild AI, MAC, mild to mod MR, severe LAE, mod RAE, PASP 32 mmHg    Past Surgical History  Procedure Laterality Date  . Colon surgery  1996    CANCER REMOVAL  . Total hip arthroplasty  2004    RIGHT  . Gallbladder surgery  2005  . Breast surgery  1991    Rt br lump removed-benign  . Liver biopsy  04/08/2012    Procedure: LIVER BIOPSY;   Surgeon: Adin Hector, MD;  Location: [facility];  Service: General;;  laparoscopic    Current Outpatient Prescriptions  Medication Sig Dispense Refill  . acetaminophen (TYLENOL) 500 MG tablet Take 500 mg by mouth every 6 (six) hours as needed. For pain    . atorvastatin (LIPITOR) 10 MG tablet Take 10 mg by mouth Daily.     . furosemide (LASIX) 20 MG tablet Take 20 mg by mouth Daily. EXTRA 20 MG PRN FOR EDEMA PT STATES PER PCP    . gabapentin (NEURONTIN) 100 MG capsule Take 1 capsule by mouth at bedtime as needed (pain).     Marland Kitchen guaiFENesin (MUCINEX) 600 MG 12 hr tablet Take 1 tablet (600 mg total) by mouth 2 (two) times daily. (Patient taking differently: Take 600 mg by mouth 2 (two) times daily as needed for cough. ) 14 tablet 0  . levothyroxine (SYNTHROID, LEVOTHROID) 50 MCG tablet Take 25 mcg by mouth Daily.     . meclizine (ANTIVERT) 25 MG tablet Take 25 mg by mouth 2 (two) times daily as needed for dizziness.     . metoprolol succinate (TOPROL XL) 25 MG 24 hr tablet Take 1 tablet (25 mg total) by mouth daily. 90 tablet 3  . Misc Natural Products (OSTEO BI-FLEX  ADV TRIPLE ST PO) Take 1 capsule by mouth 2 (two) times daily.     . Omega-3 Fatty Acids (FISH OIL) 1200 MG CAPS Take 1 capsule by mouth daily.     . polyethylene glycol (MIRALAX / GLYCOLAX) packet Take 17 g by mouth daily as needed. For constipation    . senna (SENOKOT) 8.6 MG TABS Take 1 tablet by mouth daily as needed. For constipation    . tretinoin (RETIN-A) 0.05 % cream Apply 1 application topically at bedtime.   2  . vitamin B-12 (CYANOCOBALAMIN) 100 MCG tablet Take 50 mcg by mouth daily.      . vitamin E 400 UNIT capsule Take 400 Units by mouth daily.      Marland Kitchen warfarin (COUMADIN) 3 MG tablet TAKE AS DIRECTED BY ANTICOAGULATION CLINIC 90 tablet 0  . losartan (COZAAR) 25 MG tablet      No current facility-administered medications for this visit.    Allergies  Allergen Reactions  . Cardizem [Diltiazem Hcl] Cough  . Adhesive  [Tape] Rash  . Latex Rash    Per Patient Latex=lip swelling    History   Social History  . Marital Status: Married    Spouse Name: N/A  . Number of Children: N/A  . Years of Education: N/A   Occupational History  . RETIRED [employer]   Social History Main Topics  . Smoking status: Never Smoker   . Smokeless tobacco: Not on file  . Alcohol Use: No  . Drug Use: No  . Sexual Activity: Not on file   Other Topics Concern  . Not on file   Social History Narrative    Family History  Problem Relation Age of Onset  . Heart disease Mother   . Stroke Mother   . Heart disease Father   . Pneumonia Brother   . Heart attack Neg Hx   . Hypertension Maternal Grandfather     Review of Systems:  As stated in the HPI and otherwise negative.   BP 128/70 mmHg  Pulse 66  Ht _0  (1.549 m)  Wt 194 lb (87.998 kg)  BMI 36.67 kg/m2  Physical Examination: General: Well developed, well nourished, NAD HEENT: OP clear, mucus membranes moist SKIN: warm, dry. No rashes. Neuro: No focal deficits Musculoskeletal: Muscle strength 5/5 all ext Psychiatric: Mood and affect normal Neck: No JVD, no carotid bruits, no thyromegaly, no lymphadenopathy. Lungs:Clear bilaterally, no wheezes, rhonci, crackles Cardiovascular: Irregular irregular. No murmurs, gallops or rubs. Abdomen:Soft. Bowel sounds present. Non-tender.  Extremities: No lower extremity edema. Pulses are 2 + in the bilateral DP/PT.  Echo 08/07/14: Left ventricle: The cavity size was normal. Wall thickness was normal. Indeterminant diastolic function (atrial fibrillation). Systolic function was mildly reduced. The estimated ejection fraction was in the range of 45% to 50%. Diffuse hypokinesis. - Aortic valve: There was no stenosis. There was mild regurgitation. - Mitral valve: Mildly calcified annulus. Mildly calcified leaflets . There was mild to moderate regurgitation. - Left atrium: The atrium was severely  dilated. - Right ventricle: The cavity size was normal. Systolic function was normal. - Right atrium: The atrium was moderately dilated. - Tricuspid valve: Peak RV-RA gradient (S): 29 mm Hg. - Pulmonary arteries: PA peak pressure: 32 mm Hg (S). - Inferior vena cava: The vessel was normal in size. The respirophasic diameter changes were in the normal range (>= 50%), consistent with normal central venous pressure. Impressions: - The patient was in atrial fibrillation. Normal LV size with mild global  hypokinesis, EF 45-50%. Severely dilated LA, moderately dilated RA. Normal RV size and systolic function. Mild to moderate MR. Mild AI.  EKG:  EKG is not ordered today. The ekg ordered today demonstrates   Recent Labs: 03/02/2015: BUN 22; Creatinine 0.89; Hemoglobin 14.9; Platelets 175.0; Potassium 4.1; Sodium 141; TSH 2.15   Lipid Panel    Component Value Date/Time   CHOL 153 08/29/2011 0235   TRIG 80 08/29/2011 0235   HDL 64 08/29/2011 0235   CHOLHDL 2.4 08/29/2011 0235   VLDL 16 08/29/2011 0235   LDLCALC 73 08/29/2011 0235     Wt Readings from Last 3 Encounters:  05/20/15 194 lb (87.998 kg)  03/02/15 185 lb (83.915 kg)  11/22/14 187 lb (84.823 kg)     Other studies Reviewed: Additional studies/ records that were reviewed today include: . Review of the above records demonstrates:    Assessment and Plan:   1. Atrial fibrillation,persisent: Rate controlled. She appears to be in atrial fibrillation today. No palpitations at home. Continue coumadin. Cardizem stopped due to rash, dark spots on face and cough. Now on non rate control agent. Will continue  Toprol XL 25 mg po Qdaily. She will not be considered for DCCV as she is asymptomatic.   2.  Mitral regurgitation: Moderate MR by echo August 2015. Repeat August 2017.   3. Chronic diastolic CHF: Volume stable with daily Lasix.   Current medicines are reviewed at length with the patient today.  The patient does not  have concerns regarding medicines.  The following changes have been made:  no change  Labs/ tests ordered today include:  No orders of the defined types were placed in this encounter.    Disposition:   FU with me in 12  months  Signed, Lauree Chandler, MD 05/20/2015 1:15 PM    [facility] [place], [place], [place]  [address] Phone: [phone]; Fax: [phone]

## 2015-05-20 NOTE — Patient Instructions (Signed)
Medication Instructions:  Your physician recommends that you continue on your current medications as directed. Please refer to the Current Medication list given to you today.   Labwork: none  Testing/Procedures: none  Follow-Up: Your physician wants you to follow-up in:  12 months.  You will receive a reminder letter in the mail two months in advance. If you don't receive a letter, please call our office to schedule the follow-up appointment.        

## 2015-06-14 ENCOUNTER — Ambulatory Visit (INDEPENDENT_AMBULATORY_CARE_PROVIDER_SITE_OTHER): Payer: Medicare Other | Admitting: *Deleted

## 2015-06-14 DIAGNOSIS — Z5181 Encounter for therapeutic drug level monitoring: Secondary | ICD-10-CM

## 2015-06-14 DIAGNOSIS — I481 Persistent atrial fibrillation: Secondary | ICD-10-CM

## 2015-06-14 DIAGNOSIS — Z7901 Long term (current) use of anticoagulants: Secondary | ICD-10-CM | POA: Diagnosis not present

## 2015-06-14 DIAGNOSIS — I4819 Other persistent atrial fibrillation: Secondary | ICD-10-CM

## 2015-06-14 DIAGNOSIS — I4891 Unspecified atrial fibrillation: Secondary | ICD-10-CM

## 2015-06-14 LAB — POCT INR: INR: 2.9

## 2015-07-12 ENCOUNTER — Ambulatory Visit (INDEPENDENT_AMBULATORY_CARE_PROVIDER_SITE_OTHER): Payer: Medicare Other | Admitting: *Deleted

## 2015-07-12 DIAGNOSIS — I4819 Other persistent atrial fibrillation: Secondary | ICD-10-CM

## 2015-07-12 DIAGNOSIS — Z5181 Encounter for therapeutic drug level monitoring: Secondary | ICD-10-CM | POA: Diagnosis not present

## 2015-07-12 DIAGNOSIS — I4891 Unspecified atrial fibrillation: Secondary | ICD-10-CM

## 2015-07-12 DIAGNOSIS — Z7901 Long term (current) use of anticoagulants: Secondary | ICD-10-CM | POA: Diagnosis not present

## 2015-07-12 DIAGNOSIS — I481 Persistent atrial fibrillation: Secondary | ICD-10-CM

## 2015-07-12 LAB — POCT INR: INR: 3.3

## 2015-07-26 ENCOUNTER — Ambulatory Visit (INDEPENDENT_AMBULATORY_CARE_PROVIDER_SITE_OTHER): Payer: Medicare Other

## 2015-07-26 DIAGNOSIS — Z7901 Long term (current) use of anticoagulants: Secondary | ICD-10-CM

## 2015-07-26 DIAGNOSIS — I4891 Unspecified atrial fibrillation: Secondary | ICD-10-CM | POA: Diagnosis not present

## 2015-07-26 DIAGNOSIS — Z5181 Encounter for therapeutic drug level monitoring: Secondary | ICD-10-CM | POA: Diagnosis not present

## 2015-07-26 DIAGNOSIS — I481 Persistent atrial fibrillation: Secondary | ICD-10-CM

## 2015-07-26 DIAGNOSIS — I4819 Other persistent atrial fibrillation: Secondary | ICD-10-CM

## 2015-07-26 LAB — POCT INR: INR: 3

## 2015-08-15 ENCOUNTER — Ambulatory Visit (INDEPENDENT_AMBULATORY_CARE_PROVIDER_SITE_OTHER): Payer: Medicare Other | Admitting: *Deleted

## 2015-08-15 DIAGNOSIS — Z5181 Encounter for therapeutic drug level monitoring: Secondary | ICD-10-CM | POA: Diagnosis not present

## 2015-08-15 DIAGNOSIS — I4819 Other persistent atrial fibrillation: Secondary | ICD-10-CM

## 2015-08-15 DIAGNOSIS — I481 Persistent atrial fibrillation: Secondary | ICD-10-CM | POA: Diagnosis not present

## 2015-08-15 DIAGNOSIS — I4891 Unspecified atrial fibrillation: Secondary | ICD-10-CM

## 2015-08-15 DIAGNOSIS — Z7901 Long term (current) use of anticoagulants: Secondary | ICD-10-CM

## 2015-08-15 LAB — POCT INR: INR: 5.2

## 2015-08-22 ENCOUNTER — Ambulatory Visit (INDEPENDENT_AMBULATORY_CARE_PROVIDER_SITE_OTHER): Payer: Medicare Other | Admitting: *Deleted

## 2015-08-22 DIAGNOSIS — I4891 Unspecified atrial fibrillation: Secondary | ICD-10-CM | POA: Diagnosis not present

## 2015-08-22 DIAGNOSIS — Z5181 Encounter for therapeutic drug level monitoring: Secondary | ICD-10-CM | POA: Diagnosis not present

## 2015-08-22 DIAGNOSIS — I481 Persistent atrial fibrillation: Secondary | ICD-10-CM | POA: Diagnosis not present

## 2015-08-22 DIAGNOSIS — I4819 Other persistent atrial fibrillation: Secondary | ICD-10-CM

## 2015-08-22 DIAGNOSIS — Z7901 Long term (current) use of anticoagulants: Secondary | ICD-10-CM | POA: Diagnosis not present

## 2015-08-22 LAB — POCT INR: INR: 2.5

## 2015-08-28 ENCOUNTER — Emergency Department (HOSPITAL_COMMUNITY)
Admission: EM | Admit: 2015-08-28 | Discharge: 2015-08-28 | Disposition: A | Discharge status: Home / Self Care | Payer: Medicare Other | Attending: Emergency Medicine | Admitting: Emergency Medicine

## 2015-08-28 ENCOUNTER — Encounter (HOSPITAL_COMMUNITY): Payer: Self-pay | Admitting: *Deleted

## 2015-08-28 DIAGNOSIS — R110 Nausea: Secondary | ICD-10-CM | POA: Insufficient documentation

## 2015-08-28 DIAGNOSIS — Z85038 Personal history of other malignant neoplasm of large intestine: Secondary | ICD-10-CM | POA: Diagnosis not present

## 2015-08-28 DIAGNOSIS — R1013 Epigastric pain: Secondary | ICD-10-CM | POA: Diagnosis not present

## 2015-08-28 DIAGNOSIS — I4891 Unspecified atrial fibrillation: Secondary | ICD-10-CM | POA: Diagnosis not present

## 2015-08-28 DIAGNOSIS — I10 Essential (primary) hypertension: Secondary | ICD-10-CM | POA: Insufficient documentation

## 2015-08-28 DIAGNOSIS — Z91040 Latex allergy status: Secondary | ICD-10-CM | POA: Insufficient documentation

## 2015-08-28 DIAGNOSIS — M1990 Unspecified osteoarthritis, unspecified site: Secondary | ICD-10-CM | POA: Insufficient documentation

## 2015-08-28 DIAGNOSIS — Z79899 Other long term (current) drug therapy: Secondary | ICD-10-CM | POA: Diagnosis not present

## 2015-08-28 DIAGNOSIS — Z7901 Long term (current) use of anticoagulants: Secondary | ICD-10-CM | POA: Insufficient documentation

## 2015-08-28 DIAGNOSIS — E079 Disorder of thyroid, unspecified: Secondary | ICD-10-CM | POA: Insufficient documentation

## 2015-08-28 DIAGNOSIS — K5900 Constipation, unspecified: Secondary | ICD-10-CM | POA: Diagnosis not present

## 2015-08-28 DIAGNOSIS — R5383 Other fatigue: Secondary | ICD-10-CM | POA: Diagnosis not present

## 2015-08-28 LAB — CBC WITH DIFFERENTIAL/PLATELET
BASOS ABS: 0 10*3/uL (ref 0.0–0.1)
Basophils Relative: 0 % (ref 0–1)
Eosinophils Absolute: 0 10*3/uL (ref 0.0–0.7)
Eosinophils Relative: 0 % (ref 0–5)
HEMATOCRIT: 46.9 % — AB (ref 36.0–46.0)
Hemoglobin: 15.4 g/dL — ABNORMAL HIGH (ref 12.0–15.0)
LYMPHS ABS: 2.3 10*3/uL (ref 0.7–4.0)
LYMPHS PCT: 26 % (ref 12–46)
MCH: 33.3 pg (ref 26.0–34.0)
MCHC: 32.8 g/dL (ref 30.0–36.0)
MCV: 101.3 fL — AB (ref 78.0–100.0)
Monocytes Absolute: 0.5 10*3/uL (ref 0.1–1.0)
Monocytes Relative: 6 % (ref 3–12)
NEUTROS ABS: 5.8 10*3/uL (ref 1.7–7.7)
Neutrophils Relative %: 68 % (ref 43–77)
Platelets: 124 10*3/uL — ABNORMAL LOW (ref 150–400)
RBC: 4.63 MIL/uL (ref 3.87–5.11)
RDW: 12.7 % (ref 11.5–15.5)
WBC: 8.7 10*3/uL (ref 4.0–10.5)

## 2015-08-28 LAB — BASIC METABOLIC PANEL
ANION GAP: 7 (ref 5–15)
BUN: 15 mg/dL (ref 6–20)
CHLORIDE: 107 mmol/L (ref 101–111)
CO2: 26 mmol/L (ref 22–32)
Calcium: 10.5 mg/dL — ABNORMAL HIGH (ref 8.9–10.3)
Creatinine, Ser: 0.84 mg/dL (ref 0.44–1.00)
GFR calc Af Amer: >60 mL/min (ref >60)
GLUCOSE: 117 mg/dL — AB (ref 65–99)
POTASSIUM: 3.6 mmol/L (ref 3.5–5.1)
SODIUM: 140 mmol/L (ref 135–145)

## 2015-08-28 LAB — URINALYSIS, ROUTINE W REFLEX MICROSCOPIC
Bilirubin Urine: NEGATIVE
Glucose, UA: NEGATIVE mg/dL
Ketones, ur: NEGATIVE mg/dL
NITRITE: NEGATIVE
PH: 6 (ref 5.0–8.0)
Protein, ur: NEGATIVE mg/dL
SPECIFIC GRAVITY, URINE: 1.015 (ref 1.005–1.030)
Urobilinogen, UA: 0.2 mg/dL (ref 0.0–1.0)

## 2015-08-28 LAB — LIPASE, BLOOD: LIPASE: 22 U/L (ref 22–51)

## 2015-08-28 LAB — URINE MICROSCOPIC-ADD ON

## 2015-08-28 MED ORDER — RANITIDINE HCL 300 MG PO TABS: 150.0000 mg | ORAL_TABLET | Freq: Two times a day (BID) | ORAL | Status: DC

## 2015-08-28 NOTE — ED Provider Notes (Signed)
CSN: [id number]     Arrival date & time 08/28/15  1705 History   First MD Initiated Contact with Patient 08/28/15 2021     Chief Complaint  Patient presents with  . Abdominal Pain     (Consider location/radiation/quality/duration/timing/severity/associated sxs/prior Treatment) HPI Comments: 81 year old female with past medical history including hypertension, atrial fibrillation, colon cancer status post resection, gallstone pancreatitis status post cholecystectomy who presents with epigastric pain. The patient states that last night after dinner when she was in bed, she began having a squeezing, burning pain in her midepigastrium. It was associated with a heartburn sensation and some nausea. She states that she had earlier today but it has improved throughout the day and currently has resolved. She denies any associated chest pain or shortness of breath. She denies any vomiting, diarrhea, blood in her stool, black stools, or urinary symptoms. No fevers or recent illness. She does note a one-month history of decreased energy and generalized fatigue.  Patient is a 81 y.o. female presenting with abdominal pain. The history is provided by the patient.  Abdominal Pain   Past Medical History  Diagnosis Date  . HTN (hypertension)   . Environmental allergies   . Arthritis   . Thyroid disease   . History of colon cancer   . Hemochromatosis 1998  . Atrial fibrillation   . Constipation   . Arthritis pain   . Cancer   . PONV (postoperative nausea and vomiting)   . Varicose vein of leg   . Hx of echocardiogram     Echo (8/15):  EF 45-50%, diff HK, mild AI, MAC, mild to mod MR, severe LAE, mod RAE, PASP 32 mmHg   Past Surgical History  Procedure Laterality Date  . Colon surgery  1996    CANCER REMOVAL  . Total hip arthroplasty  2004    RIGHT  . Gallbladder surgery  2005  . Breast surgery  1991    Rt br lump removed-benign  . Liver biopsy  04/08/2012    Procedure: LIVER BIOPSY;  Surgeon:  Adin Hector, MD;  Location: [facility] OR;  Service: General;;  laparoscopic   Family History  Problem Relation Age of Onset  . Heart disease Mother   . Stroke Mother   . Heart disease Father   . Pneumonia Brother   . Heart attack Neg Hx   . Hypertension Maternal Grandfather    Social History  Substance Use Topics  . Smoking status: Never Smoker   . Smokeless tobacco: None  . Alcohol Use: No   OB History    No data available     Review of Systems  Gastrointestinal: Positive for abdominal pain.   10 Systems reviewed and are negative for acute change except as noted in the HPI.    Allergies  Tramadol; Adhesive; Cardizem; and Latex  Home Medications   Prior to Admission medications   Medication Sig Start Date End Date Taking? Authorizing Provider  acetaminophen (TYLENOL) 500 MG tablet Take 500 mg by mouth every 6 (six) hours as needed. For pain   Yes Historical Provider, MD  atorvastatin (LIPITOR) 10 MG tablet Take 10 mg by mouth Daily.    Yes Historical Provider, MD  furosemide (LASIX) 20 MG tablet Take 20 mg by mouth Daily. EXTRA 20 MG PRN FOR EDEMA PT STATES PER PCP 08/22/11  Yes Historical Provider, MD  gabapentin (NEURONTIN) 100 MG capsule Take 1 capsule by mouth at bedtime as needed (pain).  04/11/15  Yes Historical Provider, MD  guaiFENesin (MUCINEX) 600 MG 12 hr tablet Take 1 tablet (600 mg total) by mouth 2 (two) times daily. Patient taking differently: Take 600 mg by mouth 2 (two) times daily as needed for cough.  04/27/13  Yes Monika Salk, MD  levothyroxine (SYNTHROID, LEVOTHROID) 50 MCG tablet Take 25 mcg by mouth Daily.    Yes Historical Provider, MD  losartan (COZAAR) 25 MG tablet Take 25 mg by mouth daily.  11/18/14  Yes Historical Provider, MD  meclizine (ANTIVERT) 25 MG tablet Take 25 mg by mouth 2 (two) times daily as needed for dizziness.    Yes Historical Provider, MD  metoprolol succinate (TOPROL XL) 25 MG 24 hr tablet Take 1 tablet (25 mg total) by mouth  daily. Patient taking differently: Take 25 mg by mouth at bedtime.  05/20/15  Yes Burnell Blanks, MD  Misc Natural Products (OSTEO BI-FLEX ADV TRIPLE ST PO) Take 1 capsule by mouth 2 (two) times daily.    Yes Historical Provider, MD  Omega-3 Fatty Acids (FISH OIL) 1200 MG CAPS Take 1 capsule by mouth daily.    Yes Historical Provider, MD  polyethylene glycol (MIRALAX / GLYCOLAX) packet Take 17 g by mouth daily as needed. For constipation   Yes Historical Provider, MD  senna (SENOKOT) 8.6 MG TABS Take 1 tablet by mouth daily. For constipation   Yes Historical Provider, MD  vitamin B-12 (CYANOCOBALAMIN) 100 MCG tablet Take 100 mcg by mouth daily.    Yes Historical Provider, MD  vitamin E 400 UNIT capsule Take 400 Units by mouth daily.     Yes Historical Provider, MD  warfarin (COUMADIN) 3 MG tablet TAKE AS DIRECTED BY ANTICOAGULATION CLINIC Patient taking differently: TAKES BY MOUTH ON SUN AND THURS 3MG , TAKES 1/2 TAB ALL OTHER DAYS 04/11/15  Yes Burnell Blanks, MD  ranitidine (ZANTAC) 300 MG tablet Take 0.5 tablets (150 mg total) by mouth 2 (two) times daily. 08/28/15   Sharlett Iles, MD  traMADol (ULTRAM) 50 MG tablet Take 25-50 mg by mouth every 12 (twelve) hours as needed for severe pain.     Historical Provider, MD   BP 145/92 mmHg  Pulse 79  Temp(Src) 97.5 F (36.4 C) (Oral)  Resp 21  SpO2 91% Physical Exam  Constitutional: She is oriented to person, place, and time. She appears well-developed and well-nourished. No distress.  HENT:  Head: Normocephalic and atraumatic.  Moist mucous membranes  Eyes: Conjunctivae are normal. Pupils are equal, round, and reactive to light.  Neck: Neck supple.  Cardiovascular: Normal rate, regular rhythm and normal heart sounds.   No murmur heard. Pulmonary/Chest: Effort normal and breath sounds normal.  Abdominal: Soft. Bowel sounds are normal. She exhibits no distension. There is no tenderness.  Musculoskeletal: She exhibits no  edema.  Neurological: She is alert and oriented to person, place, and time.  Fluent speech  Skin: Skin is warm and dry.  Psychiatric: She has a normal mood and affect. Judgment normal.  pleasant  Nursing note and vitals reviewed.   ED Course  Procedures (including critical care time) Labs Review Labs Reviewed  BASIC METABOLIC PANEL - Abnormal; Notable for the following:    Glucose, Bld 117 (*)    Calcium 10.5 (*)    All other components within normal limits  CBC WITH DIFFERENTIAL/PLATELET - Abnormal; Notable for the following:    Hemoglobin 15.4 (*)    HCT 46.9 (*)    MCV 101.3 (*)    Platelets 124 (*)  All other components within normal limits  URINALYSIS, ROUTINE W REFLEX MICROSCOPIC (NOT AT [facility]) - Abnormal; Notable for the following:    APPearance CLOUDY (*)    Hgb urine dipstick SMALL (*)    Leukocytes, UA SMALL (*)    All other components within normal limits  URINE MICROSCOPIC-ADD ON - Abnormal; Notable for the following:    Squamous Epithelial / LPF MANY (*)    Bacteria, UA FEW (*)    All other components within normal limits  LIPASE, BLOOD    Imaging Review No results found. I have personally reviewed and evaluated these lab results as part of my medical decision-making.   EKG Interpretation None      MDM   Final diagnoses:  Epigastric abdominal pain   81 year old female who presents with epigastric pain and nausea that began last night and was associated with a burning, reflux feeling. At presentation, patient was well-appearing his reassuring vital signs. No abdominal tenderness on exam. The patient voiced concern about pancreatitis as she has had this in the past related to gallstones. Obtained above lab work which showed normal WBC count, normal lipase, stable CBC from previous. UA containing small leukocytes, large amount of squamous cells, and a few bacteria. The patient denied any urinary complaints or lower abdominal pain so I feel that UTI is  unlikely. The patient had a normal bowel movement yesterday and has had no vomiting thus I feel that SBO is very unlikely. The patient is currently symptom free and denies any associated chest pain, shortness of breath, or diaphoresis, thus I feel that ACS is unlikely. Patient remains with no abdominal tenderness and has tolerated sandwich and juice in ED. I instructed her on importance of follow-up with PCP for reevaluation as she may have reflux problems. Provided with zantac and patient will contact coumadin clinic to notify them of the medication change. I reviewed return precautions and the patient and her family voiced understanding. Patient discharged in satisfactory condition.  Sharlett Iles, MD 08/28/15 [phone]

## 2015-08-28 NOTE — Discharge Instructions (Signed)

## 2015-08-28 NOTE — ED Notes (Signed)
Pt with hx of pancreatitis is here after being seen by [facility].  They felt that this could be pancreatitis.  Pt has been upper abdominal pain with nausea.

## 2015-08-30 ENCOUNTER — Other Ambulatory Visit: Payer: Self-pay | Admitting: Cardiovascular Disease

## 2015-09-07 ENCOUNTER — Ambulatory Visit (INDEPENDENT_AMBULATORY_CARE_PROVIDER_SITE_OTHER): Payer: Medicare Other | Admitting: *Deleted

## 2015-09-07 DIAGNOSIS — Z7901 Long term (current) use of anticoagulants: Secondary | ICD-10-CM | POA: Diagnosis not present

## 2015-09-07 DIAGNOSIS — I4891 Unspecified atrial fibrillation: Secondary | ICD-10-CM | POA: Diagnosis not present

## 2015-09-07 DIAGNOSIS — Z5181 Encounter for therapeutic drug level monitoring: Secondary | ICD-10-CM | POA: Diagnosis not present

## 2015-09-07 DIAGNOSIS — I481 Persistent atrial fibrillation: Secondary | ICD-10-CM

## 2015-09-07 DIAGNOSIS — I4819 Other persistent atrial fibrillation: Secondary | ICD-10-CM

## 2015-09-07 LAB — POCT INR: INR: 3.5

## 2015-09-21 ENCOUNTER — Ambulatory Visit (INDEPENDENT_AMBULATORY_CARE_PROVIDER_SITE_OTHER): Payer: Medicare Other | Admitting: *Deleted

## 2015-09-21 DIAGNOSIS — Z7901 Long term (current) use of anticoagulants: Secondary | ICD-10-CM

## 2015-09-21 DIAGNOSIS — Z5181 Encounter for therapeutic drug level monitoring: Secondary | ICD-10-CM

## 2015-09-21 DIAGNOSIS — I4891 Unspecified atrial fibrillation: Secondary | ICD-10-CM | POA: Diagnosis not present

## 2015-09-21 DIAGNOSIS — I481 Persistent atrial fibrillation: Secondary | ICD-10-CM

## 2015-09-21 DIAGNOSIS — I4819 Other persistent atrial fibrillation: Secondary | ICD-10-CM

## 2015-09-21 LAB — POCT INR: INR: 3

## 2015-10-12 ENCOUNTER — Ambulatory Visit (INDEPENDENT_AMBULATORY_CARE_PROVIDER_SITE_OTHER): Payer: Medicare Other | Admitting: Pharmacist

## 2015-10-12 DIAGNOSIS — I481 Persistent atrial fibrillation: Secondary | ICD-10-CM | POA: Diagnosis not present

## 2015-10-12 DIAGNOSIS — I4819 Other persistent atrial fibrillation: Secondary | ICD-10-CM

## 2015-10-12 DIAGNOSIS — Z7901 Long term (current) use of anticoagulants: Secondary | ICD-10-CM | POA: Diagnosis not present

## 2015-10-12 DIAGNOSIS — Z5181 Encounter for therapeutic drug level monitoring: Secondary | ICD-10-CM

## 2015-10-12 DIAGNOSIS — I4891 Unspecified atrial fibrillation: Secondary | ICD-10-CM | POA: Diagnosis not present

## 2015-10-12 LAB — POCT INR: INR: 3.3

## 2015-10-24 ENCOUNTER — Ambulatory Visit (INDEPENDENT_AMBULATORY_CARE_PROVIDER_SITE_OTHER): Payer: Medicare Other | Admitting: *Deleted

## 2015-10-24 DIAGNOSIS — Z7901 Long term (current) use of anticoagulants: Secondary | ICD-10-CM | POA: Diagnosis not present

## 2015-10-24 DIAGNOSIS — I481 Persistent atrial fibrillation: Secondary | ICD-10-CM | POA: Diagnosis not present

## 2015-10-24 DIAGNOSIS — Z5181 Encounter for therapeutic drug level monitoring: Secondary | ICD-10-CM

## 2015-10-24 DIAGNOSIS — I4891 Unspecified atrial fibrillation: Secondary | ICD-10-CM | POA: Diagnosis not present

## 2015-10-24 DIAGNOSIS — I4819 Other persistent atrial fibrillation: Secondary | ICD-10-CM

## 2015-10-24 LAB — POCT INR: INR: 2.4

## 2015-10-27 ENCOUNTER — Other Ambulatory Visit: Payer: Self-pay

## 2015-10-27 DIAGNOSIS — Z1231 Encounter for screening mammogram for malignant neoplasm of breast: Secondary | ICD-10-CM

## 2015-11-14 ENCOUNTER — Ambulatory Visit (INDEPENDENT_AMBULATORY_CARE_PROVIDER_SITE_OTHER): Payer: Medicare Other | Admitting: Pharmacist

## 2015-11-14 DIAGNOSIS — I4891 Unspecified atrial fibrillation: Secondary | ICD-10-CM

## 2015-11-14 DIAGNOSIS — Z5181 Encounter for therapeutic drug level monitoring: Secondary | ICD-10-CM | POA: Diagnosis not present

## 2015-11-14 DIAGNOSIS — Z7901 Long term (current) use of anticoagulants: Secondary | ICD-10-CM

## 2015-11-14 DIAGNOSIS — I481 Persistent atrial fibrillation: Secondary | ICD-10-CM

## 2015-11-14 DIAGNOSIS — I4819 Other persistent atrial fibrillation: Secondary | ICD-10-CM

## 2015-11-14 LAB — POCT INR: INR: 2.6

## 2015-11-28 ENCOUNTER — Ambulatory Visit
Admission: RE | Admit: 2015-11-28 | Discharge: 2015-11-28 | Disposition: A | Discharge status: Home / Self Care | Payer: Medicare Other | Source: Ambulatory Visit

## 2015-11-28 DIAGNOSIS — Z1231 Encounter for screening mammogram for malignant neoplasm of breast: Secondary | ICD-10-CM

## 2015-12-12 ENCOUNTER — Ambulatory Visit (INDEPENDENT_AMBULATORY_CARE_PROVIDER_SITE_OTHER): Payer: Medicare Other | Admitting: *Deleted

## 2015-12-12 DIAGNOSIS — I4891 Unspecified atrial fibrillation: Secondary | ICD-10-CM | POA: Diagnosis not present

## 2015-12-12 DIAGNOSIS — I481 Persistent atrial fibrillation: Secondary | ICD-10-CM | POA: Diagnosis not present

## 2015-12-12 DIAGNOSIS — Z5181 Encounter for therapeutic drug level monitoring: Secondary | ICD-10-CM

## 2015-12-12 DIAGNOSIS — I4819 Other persistent atrial fibrillation: Secondary | ICD-10-CM

## 2015-12-12 DIAGNOSIS — Z7901 Long term (current) use of anticoagulants: Secondary | ICD-10-CM

## 2015-12-12 LAB — POCT INR: INR: 2.3

## 2016-01-16 ENCOUNTER — Ambulatory Visit (INDEPENDENT_AMBULATORY_CARE_PROVIDER_SITE_OTHER): Payer: Medicare Other

## 2016-01-16 DIAGNOSIS — Z7901 Long term (current) use of anticoagulants: Secondary | ICD-10-CM | POA: Diagnosis not present

## 2016-01-16 DIAGNOSIS — I481 Persistent atrial fibrillation: Secondary | ICD-10-CM

## 2016-01-16 DIAGNOSIS — I4891 Unspecified atrial fibrillation: Secondary | ICD-10-CM

## 2016-01-16 DIAGNOSIS — I4819 Other persistent atrial fibrillation: Secondary | ICD-10-CM

## 2016-01-16 DIAGNOSIS — Z5181 Encounter for therapeutic drug level monitoring: Secondary | ICD-10-CM

## 2016-01-16 LAB — POCT INR: INR: 2.5

## 2016-01-23 ENCOUNTER — Emergency Department (HOSPITAL_COMMUNITY): Payer: Medicare Other

## 2016-01-23 ENCOUNTER — Encounter (HOSPITAL_COMMUNITY): Payer: Self-pay | Admitting: Emergency Medicine

## 2016-01-23 ENCOUNTER — Emergency Department (HOSPITAL_COMMUNITY)
Admission: EM | Admit: 2016-01-23 | Discharge: 2016-01-23 | Disposition: A | Discharge status: Home / Self Care | Payer: Medicare Other | Attending: Emergency Medicine | Admitting: Emergency Medicine

## 2016-01-23 DIAGNOSIS — Z91040 Latex allergy status: Secondary | ICD-10-CM | POA: Diagnosis not present

## 2016-01-23 DIAGNOSIS — E079 Disorder of thyroid, unspecified: Secondary | ICD-10-CM | POA: Insufficient documentation

## 2016-01-23 DIAGNOSIS — Z85038 Personal history of other malignant neoplasm of large intestine: Secondary | ICD-10-CM | POA: Insufficient documentation

## 2016-01-23 DIAGNOSIS — R52 Pain, unspecified: Secondary | ICD-10-CM

## 2016-01-23 DIAGNOSIS — M545 Low back pain, unspecified: Secondary | ICD-10-CM

## 2016-01-23 DIAGNOSIS — K5900 Constipation, unspecified: Secondary | ICD-10-CM | POA: Insufficient documentation

## 2016-01-23 DIAGNOSIS — I4891 Unspecified atrial fibrillation: Secondary | ICD-10-CM | POA: Diagnosis not present

## 2016-01-23 DIAGNOSIS — Z7901 Long term (current) use of anticoagulants: Secondary | ICD-10-CM | POA: Diagnosis not present

## 2016-01-23 DIAGNOSIS — M1990 Unspecified osteoarthritis, unspecified site: Secondary | ICD-10-CM | POA: Insufficient documentation

## 2016-01-23 DIAGNOSIS — Z79899 Other long term (current) drug therapy: Secondary | ICD-10-CM | POA: Diagnosis not present

## 2016-01-23 DIAGNOSIS — I10 Essential (primary) hypertension: Secondary | ICD-10-CM | POA: Diagnosis not present

## 2016-01-23 MED ORDER — DEXAMETHASONE SODIUM PHOSPHATE 10 MG/ML IJ SOLN
10.0000 mg | Freq: Once | INTRAMUSCULAR | Status: DC
  Administered 2016-01-23: 10 mg via INTRAVENOUS

## 2016-01-23 MED ORDER — DEXAMETHASONE SODIUM PHOSPHATE 10 MG/ML IJ SOLN
INTRAMUSCULAR | Status: AC
  Administered 2016-01-23: 10 mg via INTRAVENOUS
  Filled 2016-01-23: qty 1

## 2016-01-23 MED ORDER — DEXAMETHASONE 4 MG PO TABS
6.0000 mg | ORAL_TABLET | Freq: Once | ORAL | Status: DC
Start: 2016-01-23 — End: 2016-01-23
  Filled 2016-01-23: qty 2

## 2016-01-23 MED ORDER — HYDROCODONE-ACETAMINOPHEN 5-325 MG PO TABS: 1.0000 | ORAL_TABLET | ORAL | Status: DC | PRN

## 2016-01-23 MED ORDER — HYDROCODONE-ACETAMINOPHEN 5-325 MG PO TABS
1.0000 | ORAL_TABLET | Freq: Once | ORAL | Status: AC
  Administered 2016-01-23: 1 via ORAL
  Filled 2016-01-23: qty 1

## 2016-01-23 MED ORDER — DEXAMETHASONE 4 MG PO TABS: 4.0000 mg | ORAL_TABLET | Freq: Once | ORAL | Status: DC

## 2016-01-23 NOTE — Discharge Instructions (Signed)
Use heat and sore area 3-4 times a day. See Dr. Noemi Chapel, tomorrow, as scheduled.   Back Pain, Adult Back pain is very common. The pain often gets better over time. The cause of back pain is usually not dangerous. Most people can learn to manage their back pain on their own.  HOME CARE  Watch your back pain for any changes. The following actions may help to lessen any pain you are feeling:  Stay active. Start with short walks on flat ground if you can. Try to walk farther each day.  Exercise regularly as told by your doctor. Exercise helps your back heal faster. It also helps avoid future injury by keeping your muscles strong and flexible.  Do not sit, drive, or stand in one place for more than 30 minutes.  Do not stay in bed. Resting more than 1-2 days can slow down your recovery.  Be careful when you bend or lift an object. Use good form when lifting:  Bend at your knees.  Keep the object close to your body.  Do not twist.  Sleep on a firm mattress. Lie on your side, and bend your knees. If you lie on your back, put a pillow under your knees.  Take medicines only as told by your doctor.  Put ice on the injured area.  Put ice in a plastic bag.  Place a towel between your skin and the bag.  Leave the ice on for 20 minutes, 2-3 times a day for the first 2-3 days. After that, you can switch between ice and heat packs.  Avoid feeling anxious or stressed. Find good ways to deal with stress, such as exercise.  Maintain a healthy weight. Extra weight puts stress on your back. GET HELP IF:   You have pain that does not go away with rest or medicine.  You have worsening pain that goes down into your legs or buttocks.  You have pain that does not get better in one week.  You have pain at night.  You lose weight.  You have a fever or chills. GET HELP RIGHT AWAY IF:   You cannot control when you poop (bowel movement) or pee (urinate).  Your arms or legs feel weak.  Your  arms or legs lose feeling (numbness).  You feel sick to your stomach (nauseous) or throw up (vomit).  You have belly (abdominal) pain.  You feel like you may pass out (faint).   This information is not intended to replace advice given to you by your health care provider. Make sure you discuss any questions you have with your health care provider.   Document Released: 06/04/2008 Document Revised: 01/07/2015 Document Reviewed: 04/20/2014 Elsevier Interactive Patient Education [employer].

## 2016-01-23 NOTE — ED Notes (Signed)
Onset today patient woke up got out of bed placed right foot on floor developed pain right hip 8/10 achy sharp with movement. History of right hip surgery. Last took tylenol at 1500 today.

## 2016-01-23 NOTE — ED Notes (Signed)
Pt left with all her belongings and was wheeled out to the treatment area.

## 2016-01-23 NOTE — ED Notes (Signed)
Patient ambulated well, with less pain than when she came to the ER with.

## 2016-01-23 NOTE — ED Provider Notes (Signed)
CSN: [id number]     Arrival date & time 01/23/16  1734 History   First MD Initiated Contact with Patient 01/23/16 1819     Chief Complaint  Patient presents with  . Hip Pain     (Consider location/radiation/quality/duration/timing/severity/associated sxs/prior Treatment) HPI   [name] is a 82 y.o. female who noticed pain in her right lower back/buttock, when she first got out of bed this morning. By moving slowly. She was able to get up and walk, using her cane. The pain persisted, and continued to be worse with attempts at ambulation, so she decided to come here for evaluation, by EMS. No known trauma. No similar in the past. She has chronic arthritis. She denies recent illness. She is taking usual medications and today took a Tylenol without relief of the pain. There are no other known modifying factors.   Past Medical History  Diagnosis Date  . HTN (hypertension)   . Environmental allergies   . Arthritis   . Thyroid disease   . History of colon cancer   . Hemochromatosis 1998  . Atrial fibrillation ([place])   . Constipation   . Arthritis pain   . Cancer ([place])   . PONV (postoperative nausea and vomiting)   . Varicose vein of leg   . Hx of echocardiogram     Echo (8/15):  EF 45-50%, diff HK, mild AI, MAC, mild to mod MR, severe LAE, mod RAE, PASP 32 mmHg   Past Surgical History  Procedure Laterality Date  . Colon surgery  1996    CANCER REMOVAL  . Total hip arthroplasty  2004    RIGHT  . Gallbladder surgery  2005  . Breast surgery  1991    Rt br lump removed-benign  . Liver biopsy  04/08/2012    Procedure: LIVER BIOPSY;  Surgeon: Adin Hector, MD;  Location: [facility] OR;  Service: General;;  laparoscopic   Family History  Problem Relation Age of Onset  . Heart disease Mother   . Stroke Mother   . Heart disease Father   . Pneumonia Brother   . Heart attack Neg Hx   . Hypertension Maternal Grandfather    Social History  Substance Use Topics  . Smoking status: Never  Smoker   . Smokeless tobacco: None  . Alcohol Use: No   OB History    No data available     Review of Systems  All other systems reviewed and are negative.     Allergies  Tramadol; Adhesive; Cardizem; and Latex  Home Medications   Prior to Admission medications   Medication Sig Start Date End Date Taking? Authorizing Provider  acetaminophen (TYLENOL) 500 MG tablet Take 500 mg by mouth every 6 (six) hours as needed. For pain   Yes Historical Provider, MD  atorvastatin (LIPITOR) 10 MG tablet Take 10 mg by mouth Daily.    Yes Historical Provider, MD  furosemide (LASIX) 20 MG tablet Take 20 mg by mouth Daily. EXTRA 20 MG PRN FOR EDEMA PT STATES PER PCP 08/22/11  Yes Historical Provider, MD  gabapentin (NEURONTIN) 100 MG capsule Take 1 capsule by mouth at bedtime as needed (pain).  04/11/15  Yes Historical Provider, MD  guaiFENesin (MUCINEX) 600 MG 12 hr tablet Take 1 tablet (600 mg total) by mouth 2 (two) times daily. Patient taking differently: Take 600 mg by mouth 2 (two) times daily as needed for cough.  04/27/13  Yes Monika Salk, MD  levothyroxine (SYNTHROID, [place]) 50 MCG  tablet Take 25 mcg by mouth Daily.    Yes Historical Provider, MD  losartan (COZAAR) 25 MG tablet Take 25 mg by mouth daily.  11/18/14  Yes Historical Provider, MD  meclizine (ANTIVERT) 25 MG tablet Take 25 mg by mouth 2 (two) times daily as needed for dizziness.    Yes Historical Provider, MD  metoprolol succinate (TOPROL XL) 25 MG 24 hr tablet Take 1 tablet (25 mg total) by mouth daily. Patient taking differently: Take 25 mg by mouth at bedtime.  05/20/15  Yes Burnell Blanks, MD  Omega-3 Fatty Acids (FISH OIL) 1200 MG CAPS Take 1 capsule by mouth daily.    Yes Historical Provider, MD  polyethylene glycol (MIRALAX / GLYCOLAX) packet Take 17 g by mouth daily as needed. For constipation   Yes Historical Provider, MD  senna (SENOKOT) 8.6 MG TABS Take 1 tablet by mouth 2 (two) times daily. For constipation    Yes Historical Provider, MD  vitamin E 400 UNIT capsule Take 400 Units by mouth daily.     Yes Historical Provider, MD  warfarin (COUMADIN) 3 MG tablet TAKE AS DIRECTED BY  ANTICOAGULATION  CLINIC Patient taking differently: Take 1.5 mg daily except take 3 mg ONLY on Thursay 08/30/15  Yes Burnell Blanks, MD  HYDROcodone-acetaminophen (NORCO) 5-325 MG tablet Take 1 tablet by mouth every 4 (four) hours as needed. 01/23/16   Daleen Bo, MD   BP 142/101 mmHg  Pulse 89  Temp(Src) 98.4 F (36.9 C) (Oral)  Resp 16  Ht 5\' 6"  (1.676 m)  Wt 185 lb (83.915 kg)  BMI 29.87 kg/m2  SpO2 96% Physical Exam  Constitutional: She is oriented to person, place, and time. She appears well-developed. No distress.  Elderly, Frail  HENT:  Head: Normocephalic and atraumatic.  Right Ear: External ear normal.  Left Ear: External ear normal.  Eyes: Conjunctivae and EOM are normal. Pupils are equal, round, and reactive to light.  Neck: Normal range of motion and phonation normal. Neck supple.  Cardiovascular: Normal rate, regular rhythm and normal heart sounds.   Pulmonary/Chest: Effort normal and breath sounds normal. She exhibits no bony tenderness.  Abdominal: Soft. There is no tenderness.  Musculoskeletal: Normal range of motion.  Tender right sacroiliac region without swelling or deformity. Mild right lumbar tenderness without step-off. I'm able to passively move the right hip, without pain.  Neurological: She is alert and oriented to person, place, and time. No cranial nerve deficit or sensory deficit. She exhibits normal muscle tone. Coordination normal.  Skin: Skin is warm, dry and intact.  Psychiatric: She has a normal mood and affect. Her behavior is normal. Judgment and thought content normal.  Nursing note and vitals reviewed.   ED Course  Procedures (including critical care time)  Medications  HYDROcodone-acetaminophen (NORCO/VICODIN) 5-325 MG per tablet 1 tablet (1 tablet Oral Given  01/23/16 2009)    Patient Vitals for the past 24 hrs:  BP Temp Temp src Pulse Resp SpO2 Height Weight  01/23/16 2045 (!) 142/101 mmHg - - 89 - 96 % - -  01/23/16 2030 135/74 mmHg - - 86 - 96 % - -  01/23/16 2015 (!) 134/104 mmHg - - 72 - 97 % - -  01/23/16 2000 130/88 mmHg - - 81 - 98 % - -  01/23/16 1945 132/76 mmHg - - 84 - 95 % - -  01/23/16 1930 129/69 mmHg - - 78 - 94 % - -  01/23/16 1830 135/92 mmHg - -  87 16 97 % - -  01/23/16 1815 134/92 mmHg - - 88 16 95 % - -  01/23/16 1800 138/88 mmHg - - 86 16 96 % - -  01/23/16 1739 141/93 mmHg 98.4 F (36.9 C) Oral 91 16 94 % - -  01/23/16 1736 - - - - - - 5\' 6"  (1.676 m) 185 lb (83.915 kg)  01/23/16 1734 - - - - - 94 % - -    9:40 PM Reevaluation with update and discussion. After initial assessment and treatment, an updated evaluation reveals she is currently sitting up on the stretcher, comfortable, and has ablated using her cane. She and family members feel like she is significantly improved, and that she is able to manage herself at home and this condition. Findings discussed with patient, family members, all questions were answered. [place] Review Labs Reviewed - No data to display  Imaging Review Dg Lumbar Spine Complete  01/23/2016  CLINICAL DATA:  Pain radiates from tailbone to right hip. EXAM: LUMBAR SPINE - COMPLETE 4+ VIEW COMPARISON:  None. FINDINGS: No evidence of fracture. No subluxation. Loss of disc height with endplate degenerative spurring is seen at L3-4 and L4-5. The SI joints are unremarkable. IMPRESSION: Degenerative changes in the lower lumbar spine without acute bony abnormality. Electronically Signed   By: Misty Stanley M.D.   On: 01/23/2016 19:07   Dg Pelvis 1-2 Views  01/23/2016  CLINICAL DATA:  Acute onset right hip pain on standing at today. Initial encounter. EXAM: PELVIS - 1-2 VIEW COMPARISON:  None. FINDINGS: No acute bony or joint abnormality is identified. The patient has a right hip  arthroplasty in place. The femoral stem is incompletely imaged. Severe left hip osteoarthritis is identified. There is convex left lumbar scoliosis and lower lumbar degenerative change. IMPRESSION: No acute abnormality. Status post right hip replacement. The femoral stem is incompletely imaged on this single view of the pelvis. Advanced left hip osteoarthritis. Lower lumbar scoliosis and spondylosis. Electronically Signed   By: Inge Rise M.D.   On: 01/23/2016 19:08   I have personally reviewed and evaluated these images and lab results as part of my medical decision-making.   EKG Interpretation None      MDM   Final diagnoses:  Pain  Right-sided low back pain without sciatica    Back pain, likely related to degenerative joint disease from the lumbar spine. Doubt cauda equina syndrome, fracture, lumbar radiculopathy.  Nursing Notes Reviewed/ Care Coordinated Applicable Imaging Reviewed Interpretation of Laboratory Data incorporated into ED treatment  The patient appears reasonably screened and/or stabilized for discharge and I doubt any other medical condition or other [facility] requiring further screening, evaluation, or treatment in the ED at this time prior to discharge.  Plan: Home Medications- Norco; Home Treatments- rest, Heat; return here if the recommended treatment, does not improve the symptoms; Recommended follow up- PCP prn. Ortho tomorrow as scheduled     Daleen Bo, MD 01/23/16 2141

## 2016-01-26 ENCOUNTER — Telehealth: Payer: Self-pay | Admitting: Pharmacist

## 2016-01-26 NOTE — Telephone Encounter (Signed)
Pt called to report that she is starting prednisone 5mg  taper for 6 days (6 tablets, then 5 tablets, then 4 tablets, etc.) and  Norco Q4Hr as needed.  Instructed her that prednisone can affect her INR and we would like to see her sooner. Rescheduled appt for 1/31. Pt stated she understood.

## 2016-01-30 ENCOUNTER — Other Ambulatory Visit: Payer: Self-pay | Admitting: Cardiovascular Disease

## 2016-01-31 ENCOUNTER — Ambulatory Visit (INDEPENDENT_AMBULATORY_CARE_PROVIDER_SITE_OTHER): Payer: Medicare Other | Admitting: *Deleted

## 2016-01-31 DIAGNOSIS — Z7901 Long term (current) use of anticoagulants: Secondary | ICD-10-CM

## 2016-01-31 DIAGNOSIS — I4819 Other persistent atrial fibrillation: Secondary | ICD-10-CM

## 2016-01-31 DIAGNOSIS — I4891 Unspecified atrial fibrillation: Secondary | ICD-10-CM | POA: Diagnosis not present

## 2016-01-31 DIAGNOSIS — I481 Persistent atrial fibrillation: Secondary | ICD-10-CM

## 2016-01-31 DIAGNOSIS — Z5181 Encounter for therapeutic drug level monitoring: Secondary | ICD-10-CM

## 2016-01-31 LAB — POCT INR: INR: 3.1

## 2016-02-03 ENCOUNTER — Telehealth: Payer: Self-pay | Admitting: *Deleted

## 2016-02-03 NOTE — Telephone Encounter (Signed)
Spoke with pt and she states was started on Prednisone 5mg  take 2 tabs daily for 1 week then take 1 tablet daily for arthritis the patient informed that [place] and coumadin do interact so will bring her in next week to check her INR and appt made for Tuesday Feb 7th and she states understanding Pt instructed to take Prednisone and coumadin as ordered

## 2016-02-07 ENCOUNTER — Ambulatory Visit (INDEPENDENT_AMBULATORY_CARE_PROVIDER_SITE_OTHER): Payer: Medicare Other | Admitting: Pharmacist

## 2016-02-07 DIAGNOSIS — I481 Persistent atrial fibrillation: Secondary | ICD-10-CM

## 2016-02-07 DIAGNOSIS — I4891 Unspecified atrial fibrillation: Secondary | ICD-10-CM

## 2016-02-07 DIAGNOSIS — Z5181 Encounter for therapeutic drug level monitoring: Secondary | ICD-10-CM

## 2016-02-07 DIAGNOSIS — I4819 Other persistent atrial fibrillation: Secondary | ICD-10-CM

## 2016-02-07 DIAGNOSIS — Z7901 Long term (current) use of anticoagulants: Secondary | ICD-10-CM

## 2016-02-07 LAB — POCT INR: INR: 2.5

## 2016-02-17 ENCOUNTER — Ambulatory Visit (INDEPENDENT_AMBULATORY_CARE_PROVIDER_SITE_OTHER): Payer: Medicare Other | Admitting: *Deleted

## 2016-02-17 DIAGNOSIS — I4891 Unspecified atrial fibrillation: Secondary | ICD-10-CM

## 2016-02-17 DIAGNOSIS — Z7901 Long term (current) use of anticoagulants: Secondary | ICD-10-CM | POA: Diagnosis not present

## 2016-02-17 DIAGNOSIS — Z5181 Encounter for therapeutic drug level monitoring: Secondary | ICD-10-CM | POA: Diagnosis not present

## 2016-02-17 DIAGNOSIS — I481 Persistent atrial fibrillation: Secondary | ICD-10-CM

## 2016-02-17 DIAGNOSIS — I4819 Other persistent atrial fibrillation: Secondary | ICD-10-CM

## 2016-02-17 LAB — POCT INR: INR: 2.9

## 2016-03-05 ENCOUNTER — Ambulatory Visit (INDEPENDENT_AMBULATORY_CARE_PROVIDER_SITE_OTHER): Payer: Medicare Other | Admitting: Pharmacist

## 2016-03-05 DIAGNOSIS — I481 Persistent atrial fibrillation: Secondary | ICD-10-CM

## 2016-03-05 DIAGNOSIS — Z7901 Long term (current) use of anticoagulants: Secondary | ICD-10-CM | POA: Diagnosis not present

## 2016-03-05 DIAGNOSIS — I4891 Unspecified atrial fibrillation: Secondary | ICD-10-CM | POA: Diagnosis not present

## 2016-03-05 DIAGNOSIS — Z5181 Encounter for therapeutic drug level monitoring: Secondary | ICD-10-CM | POA: Diagnosis not present

## 2016-03-05 DIAGNOSIS — I4819 Other persistent atrial fibrillation: Secondary | ICD-10-CM

## 2016-03-05 LAB — POCT INR: INR: 2.7

## 2016-04-02 ENCOUNTER — Ambulatory Visit (INDEPENDENT_AMBULATORY_CARE_PROVIDER_SITE_OTHER): Payer: Medicare Other

## 2016-04-02 DIAGNOSIS — Z5181 Encounter for therapeutic drug level monitoring: Secondary | ICD-10-CM

## 2016-04-02 DIAGNOSIS — I4819 Other persistent atrial fibrillation: Secondary | ICD-10-CM

## 2016-04-02 DIAGNOSIS — Z7901 Long term (current) use of anticoagulants: Secondary | ICD-10-CM | POA: Diagnosis not present

## 2016-04-02 DIAGNOSIS — I4891 Unspecified atrial fibrillation: Secondary | ICD-10-CM

## 2016-04-02 DIAGNOSIS — I481 Persistent atrial fibrillation: Secondary | ICD-10-CM

## 2016-04-02 LAB — POCT INR: INR: 2.5

## 2016-05-14 ENCOUNTER — Ambulatory Visit (INDEPENDENT_AMBULATORY_CARE_PROVIDER_SITE_OTHER): Payer: Medicare Other | Admitting: *Deleted

## 2016-05-14 DIAGNOSIS — I481 Persistent atrial fibrillation: Secondary | ICD-10-CM

## 2016-05-14 DIAGNOSIS — I4891 Unspecified atrial fibrillation: Secondary | ICD-10-CM

## 2016-05-14 DIAGNOSIS — Z5181 Encounter for therapeutic drug level monitoring: Secondary | ICD-10-CM | POA: Diagnosis not present

## 2016-05-14 DIAGNOSIS — Z7901 Long term (current) use of anticoagulants: Secondary | ICD-10-CM | POA: Diagnosis not present

## 2016-05-14 DIAGNOSIS — I4819 Other persistent atrial fibrillation: Secondary | ICD-10-CM

## 2016-05-14 LAB — POCT INR: INR: 1.9

## 2016-06-11 ENCOUNTER — Ambulatory Visit (INDEPENDENT_AMBULATORY_CARE_PROVIDER_SITE_OTHER): Payer: Medicare Other | Admitting: *Deleted

## 2016-06-11 DIAGNOSIS — Z5181 Encounter for therapeutic drug level monitoring: Secondary | ICD-10-CM | POA: Diagnosis not present

## 2016-06-11 DIAGNOSIS — I4891 Unspecified atrial fibrillation: Secondary | ICD-10-CM | POA: Diagnosis not present

## 2016-06-11 DIAGNOSIS — I481 Persistent atrial fibrillation: Secondary | ICD-10-CM

## 2016-06-11 DIAGNOSIS — Z7901 Long term (current) use of anticoagulants: Secondary | ICD-10-CM | POA: Diagnosis not present

## 2016-06-11 DIAGNOSIS — I4819 Other persistent atrial fibrillation: Secondary | ICD-10-CM

## 2016-06-11 LAB — POCT INR: INR: 1.9

## 2016-06-25 ENCOUNTER — Ambulatory Visit (INDEPENDENT_AMBULATORY_CARE_PROVIDER_SITE_OTHER): Payer: Medicare Other | Admitting: *Deleted

## 2016-06-25 DIAGNOSIS — I4819 Other persistent atrial fibrillation: Secondary | ICD-10-CM

## 2016-06-25 DIAGNOSIS — Z7901 Long term (current) use of anticoagulants: Secondary | ICD-10-CM

## 2016-06-25 DIAGNOSIS — I4891 Unspecified atrial fibrillation: Secondary | ICD-10-CM | POA: Diagnosis not present

## 2016-06-25 DIAGNOSIS — Z5181 Encounter for therapeutic drug level monitoring: Secondary | ICD-10-CM

## 2016-06-25 DIAGNOSIS — I481 Persistent atrial fibrillation: Secondary | ICD-10-CM

## 2016-06-25 LAB — POCT INR: INR: 2.6

## 2016-07-01 ENCOUNTER — Other Ambulatory Visit: Payer: Self-pay | Admitting: Cardiovascular Disease

## 2016-07-12 ENCOUNTER — Ambulatory Visit (INDEPENDENT_AMBULATORY_CARE_PROVIDER_SITE_OTHER): Payer: Medicare Other | Admitting: Pharmacist

## 2016-07-12 DIAGNOSIS — I4891 Unspecified atrial fibrillation: Secondary | ICD-10-CM | POA: Diagnosis not present

## 2016-07-12 DIAGNOSIS — Z5181 Encounter for therapeutic drug level monitoring: Secondary | ICD-10-CM | POA: Diagnosis not present

## 2016-07-12 DIAGNOSIS — Z7901 Long term (current) use of anticoagulants: Secondary | ICD-10-CM | POA: Diagnosis not present

## 2016-07-12 DIAGNOSIS — I4819 Other persistent atrial fibrillation: Secondary | ICD-10-CM

## 2016-07-12 DIAGNOSIS — I481 Persistent atrial fibrillation: Secondary | ICD-10-CM | POA: Diagnosis not present

## 2016-07-12 LAB — POCT INR: INR: 2.5

## 2016-08-09 ENCOUNTER — Ambulatory Visit (INDEPENDENT_AMBULATORY_CARE_PROVIDER_SITE_OTHER): Payer: Medicare Other | Admitting: Pharmacist

## 2016-08-09 DIAGNOSIS — Z7901 Long term (current) use of anticoagulants: Secondary | ICD-10-CM

## 2016-08-09 DIAGNOSIS — Z5181 Encounter for therapeutic drug level monitoring: Secondary | ICD-10-CM

## 2016-08-09 DIAGNOSIS — I481 Persistent atrial fibrillation: Secondary | ICD-10-CM

## 2016-08-09 DIAGNOSIS — I4891 Unspecified atrial fibrillation: Secondary | ICD-10-CM

## 2016-08-09 DIAGNOSIS — I4819 Other persistent atrial fibrillation: Secondary | ICD-10-CM

## 2016-08-09 LAB — POCT INR: INR: 2.7

## 2016-08-17 IMAGING — DX DG LUMBAR SPINE COMPLETE 4+V
5 series · 5 of 5 positions shown · non-contrast
Comparison: None.

CLINICAL DATA: Pain radiates from tailbone to right hip.

EXAM:
LUMBAR SPINE - COMPLETE 4+ VIEW

[l-spine ap]
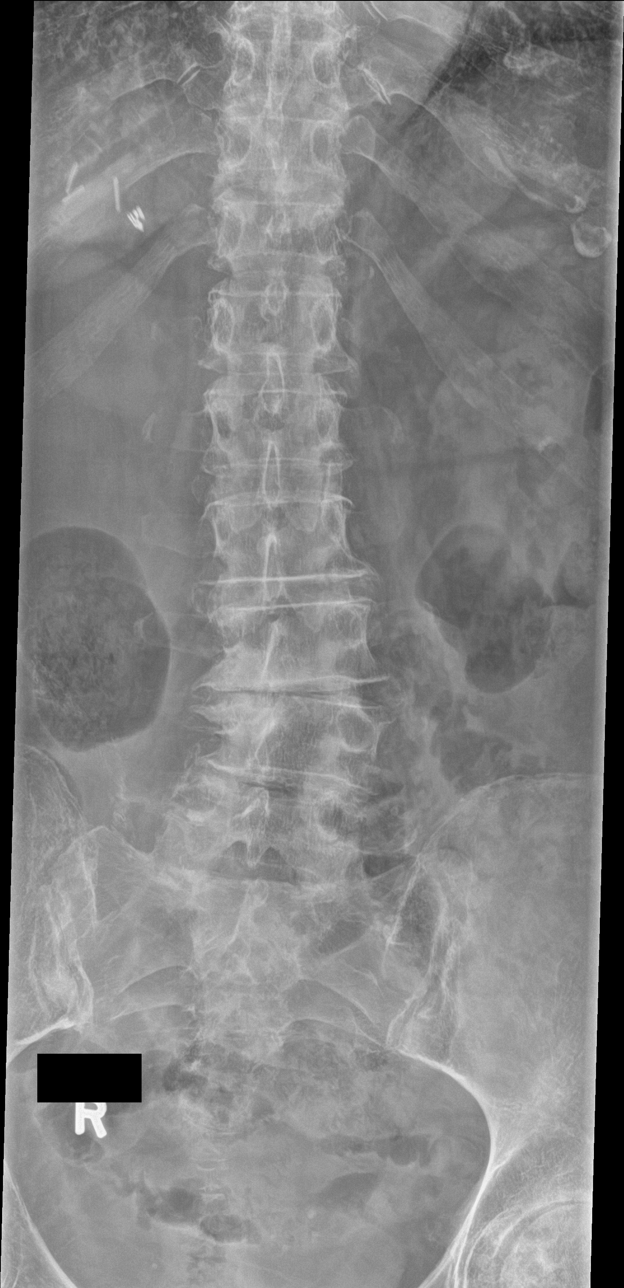

[l-spine obl (1 of 2)]
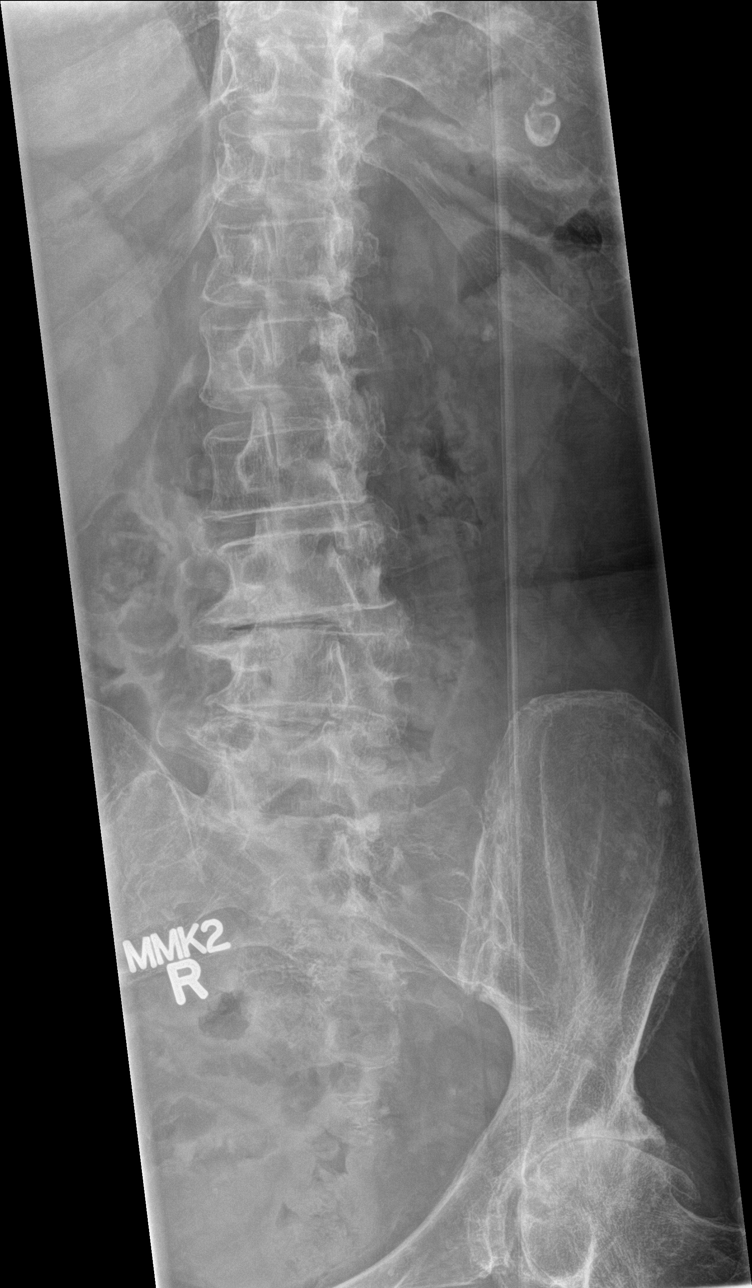

[l-spine obl (2 of 2)]
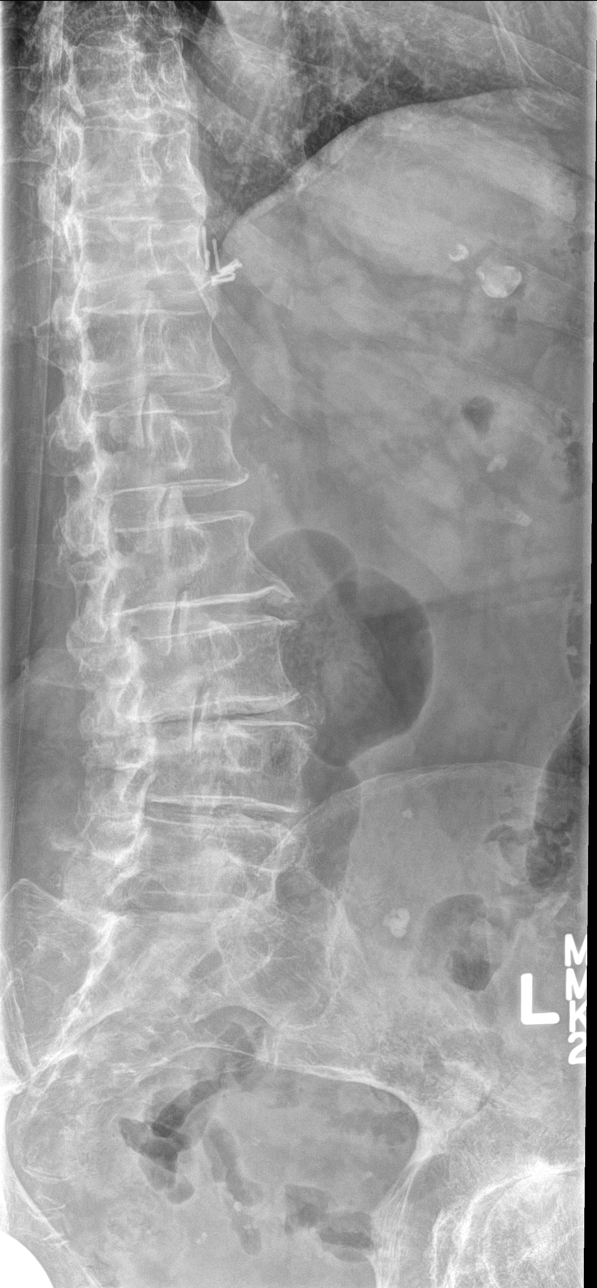

[l-spine lat]
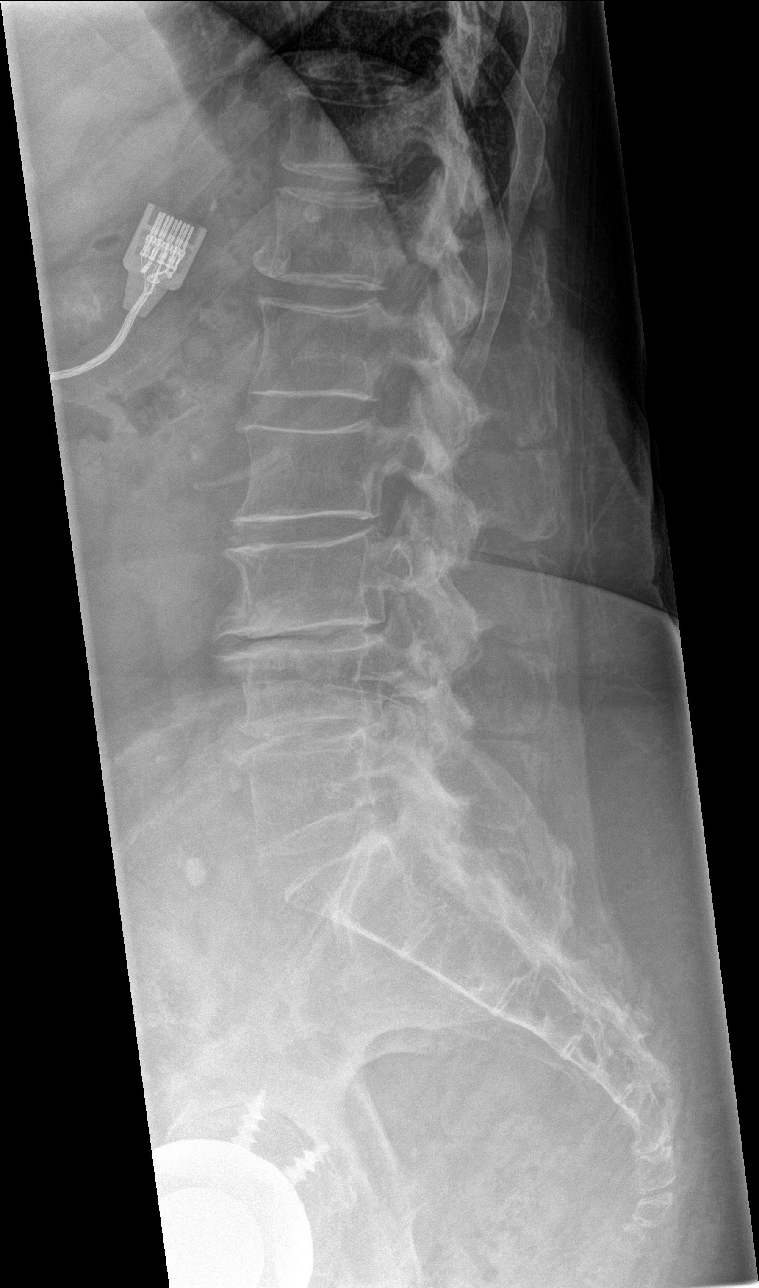

[l-spine spot]
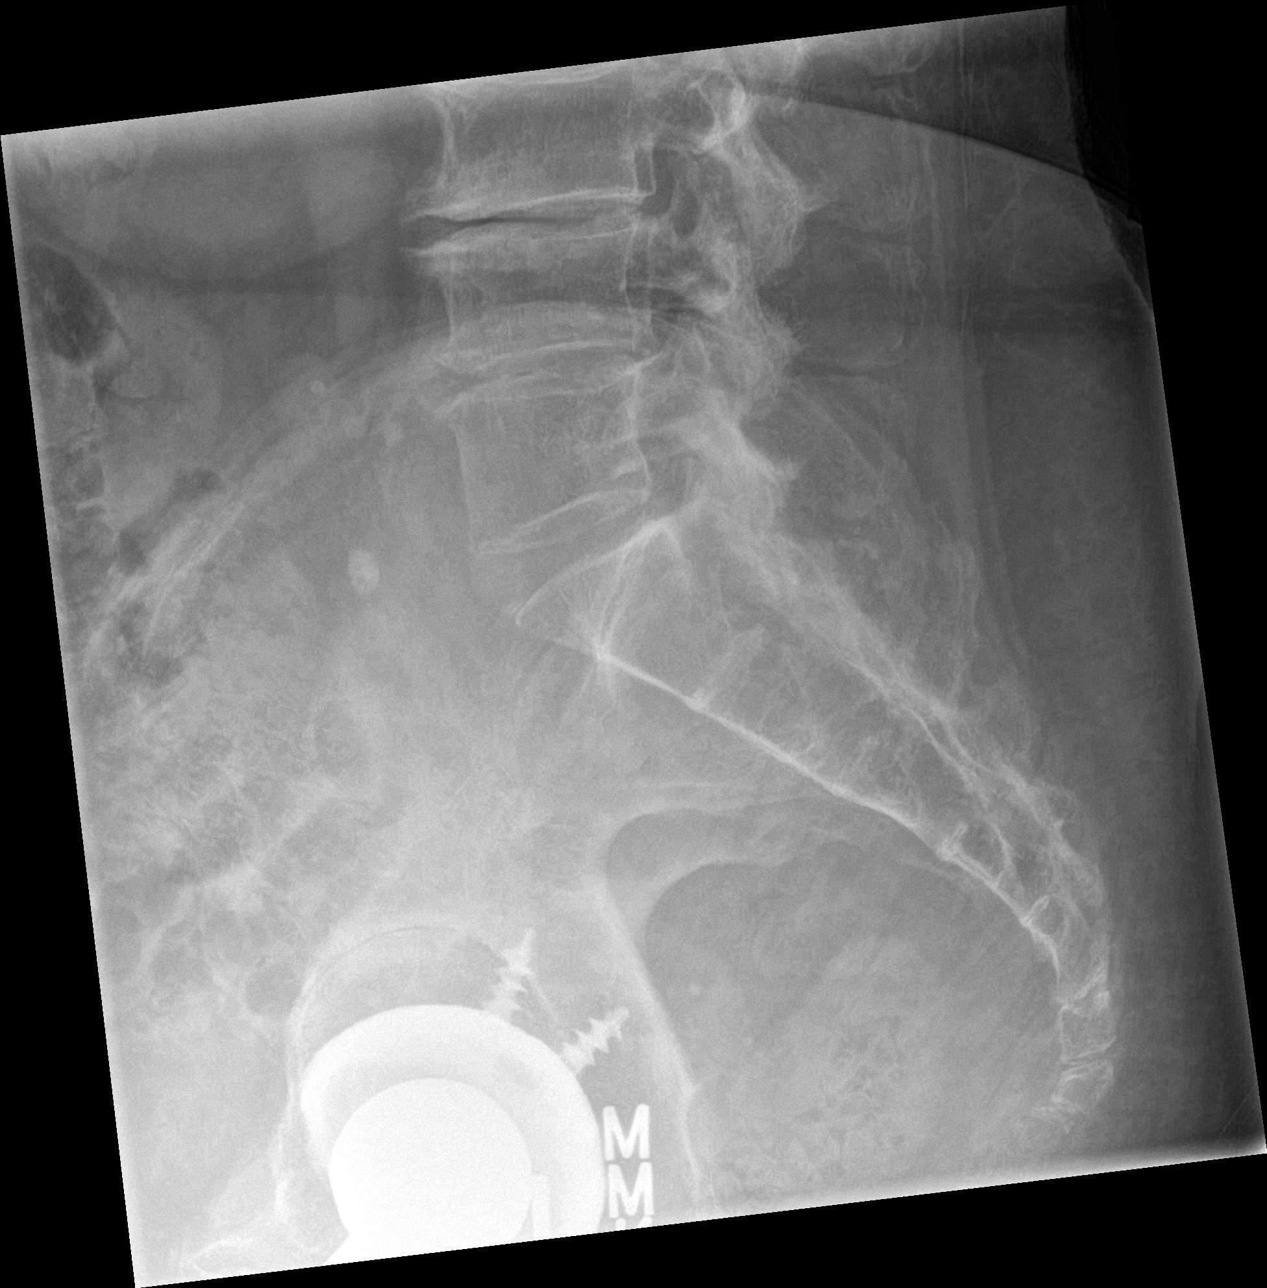

[5 of 5 positions shown; findings below may reference images not displayed]

FINDINGS: No evidence of fracture. No subluxation. Loss of disc height with
endplate degenerative spurring is seen at L3-4 and L4-5. The SI
joints are unremarkable.
IMPRESSION: Degenerative changes in the lower lumbar spine without acute bony
abnormality.

## 2016-08-17 IMAGING — DX DG PELVIS 1-2V
1 series · 1 of 1 positions shown · non-contrast
Comparison: None.

CLINICAL DATA: Acute onset right hip pain on standing at today.
Initial encounter.

EXAM:
PELVIS - 1-2 VIEW

[pelvis ap]
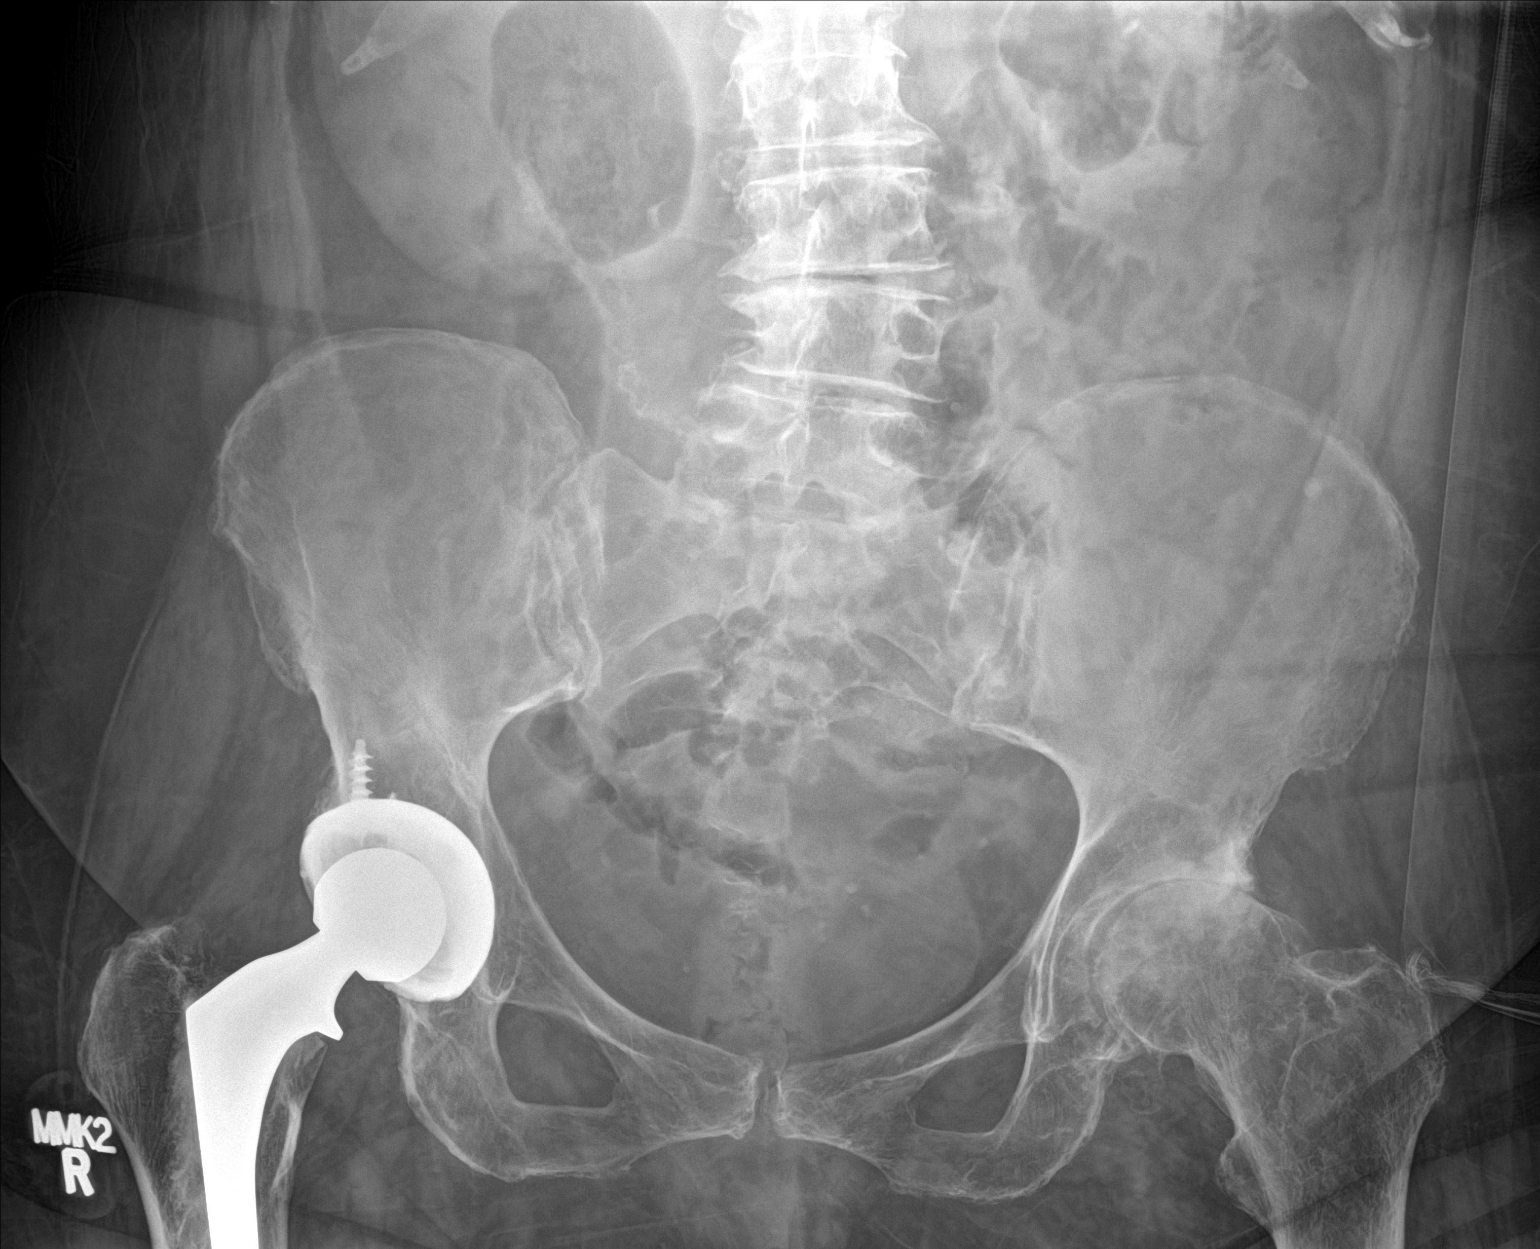

[1 of 1 positions shown; findings below may reference images not displayed]

FINDINGS: No acute bony or joint abnormality is identified. The patient has a
right hip arthroplasty in place. The femoral stem is incompletely
imaged. Severe left hip osteoarthritis is identified. There is
convex left lumbar scoliosis and lower lumbar degenerative change.
IMPRESSION: No acute abnormality.

Status post right hip replacement. The femoral stem is incompletely
imaged on this single view of the pelvis.

Advanced left hip osteoarthritis.

Lower lumbar scoliosis and spondylosis.

## 2016-09-18 NOTE — Progress Notes (Signed)
Chief Complaint  Patient presents with  . Leg Swelling    History of Present Illness: 82 yo WF with history of colon cancer, hypothyroidism, HLD, HTN, mitral regurgitation, aortic valve insufficiency, hemochromatosis, paroxysmal atrial fibrillation here today for cardiac follow up. When I met her in September of 2012, she was in sinus rhythm. She has been continued on coumadin. Echo September 2013 showed mild AI, moderate MR, normal LV function. She underwent laparoscopic lysis of adhesions, biopsy of the liver mass, a laparoscopic right colectomy on April 09, 2011 per Dr. Dalbert Batman. Final pathology report showed a benign tubular adenoma of the colon, a degenerating hyalinized cyst of the liver. She was seen by Richardson Dopp, PA-C July 2015 and c/o LE edema. Lasix was increased. Echo 08/07/14 with LVEF=50%, mild AI, mild to moderate MR. I saw her last in May 2016 and she was doing well.   She is here today for cardiac follow up. She is feeling well. No chest pain or SOB. No dizziness, near syncope or syncope.    Primary Care Physician: Myriam Jacobson, MD   Past Medical History:  Diagnosis Date  . Arthritis   . Arthritis pain   . Atrial fibrillation ([place])   . Cancer ([place])   . Constipation   . Environmental allergies   . Hemochromatosis 1998  . History of colon cancer   . HTN (hypertension)   . Hx of echocardiogram    Echo (8/15):  EF 45-50%, diff HK, mild AI, MAC, mild to mod MR, severe LAE, mod RAE, PASP 32 mmHg  . PONV (postoperative nausea and vomiting)   . Thyroid disease   . Varicose vein of leg     Past Surgical History:  Procedure Laterality Date  . BREAST SURGERY  1991   Rt br lump removed-benign  . COLON SURGERY  1996   CANCER REMOVAL  . GALLBLADDER SURGERY  2005  . LIVER BIOPSY  04/08/2012   Procedure: LIVER BIOPSY;  Surgeon: Adin Hector, MD;  Location: [facility];  Service: General;;  laparoscopic  . TOTAL HIP ARTHROPLASTY  2004   RIGHT    Current Outpatient  Prescriptions  Medication Sig Dispense Refill  . acetaminophen (TYLENOL) 500 MG tablet Take 500 mg by mouth every 6 (six) hours as needed. For pain    . atorvastatin (LIPITOR) 10 MG tablet Take 10 mg by mouth Daily.     . furosemide (LASIX) 20 MG tablet Take 20 mg by mouth Daily. EXTRA 20 MG PRN FOR EDEMA PT STATES PER PCP    . gabapentin (NEURONTIN) 100 MG capsule Take 1 capsule by mouth at bedtime as needed (pain).     Marland Kitchen guaiFENesin (MUCINEX) 600 MG 12 hr tablet Take 1 tablet (600 mg total) by mouth 2 (two) times daily. (Patient taking differently: Take 600 mg by mouth 2 (two) times daily as needed for cough. ) 14 tablet 0  . HYDROcodone-acetaminophen (NORCO) 5-325 MG tablet Take 1 tablet by mouth every 4 (four) hours as needed. 20 tablet 0  . levothyroxine (SYNTHROID, LEVOTHROID) 50 MCG tablet Take 25 mcg by mouth Daily.     Marland Kitchen losartan (COZAAR) 50 MG tablet Take 50 mg by mouth daily. MG  QD  . meclizine (ANTIVERT) 25 MG tablet Take 25 mg by mouth 2 (two) times daily as needed for dizziness.     . metoprolol succinate (TOPROL XL) 25 MG 24 hr tablet Take 1 tablet (25 mg total) by mouth daily. (Patient taking differently: Take  25 mg by mouth at bedtime. ) 90 tablet 3  . Omega-3 Fatty Acids (FISH OIL) 1200 MG CAPS Take 1 capsule by mouth daily.     . polyethylene glycol (MIRALAX / GLYCOLAX) packet Take 17 g by mouth daily as needed. For constipation    . senna (SENOKOT) 8.6 MG TABS Take 1 tablet by mouth 2 (two) times daily. For constipation    . vitamin E 400 UNIT capsule Take 400 Units by mouth daily.      Marland Kitchen warfarin (COUMADIN) 3 MG tablet TAKE AS DIRECTED BY  ANTICOAGULATION  CLINIC 90 tablet 0   No current facility-administered medications for this visit.     Allergies  Allergen Reactions  . Tramadol Other (See Comments)    MENTAL CHANGES, VERY SNAPPY, MADE PATIENT FEEL WORSE AND PATIENT PREFERENCE NOT TO TAKE MED  . Adhesive [Tape] Rash  . Cardizem [Diltiazem Hcl] Cough    Rash that  caused dark spotting   . Latex Rash    Per Patient Latex=lip swelling    Social History   Social History  . Marital status: Married    Spouse name: N/A  . Number of children: N/A  . Years of education: N/A   Occupational History  . RETIRED [employer]   Social History Main Topics  . Smoking status: Never Smoker  . Smokeless tobacco: Not on file  . Alcohol use No  . Drug use: No  . Sexual activity: Not on file   Other Topics Concern  . Not on file   Social History Narrative  . No narrative on file    Family History  Problem Relation Age of Onset  . Heart disease Mother   . Stroke Mother   . Heart disease Father   . Pneumonia Brother   . Heart attack Neg Hx   . Hypertension Maternal Grandfather     Review of Systems:  As stated in the HPI and otherwise negative.   BP 100/76 (BP Location: Left Arm, Patient Position: Sitting, Cuff Size: Normal)   Pulse 76   Ht 5\' 6"  (1.676 m)   Wt 85.2 kg (187 lb 12.8 oz)   BMI 30.31 kg/m   Physical Examination: General: Well developed, well nourished, NAD  HEENT: OP clear, mucus membranes moist  SKIN: warm, dry. No rashes. Neuro: No focal deficits  Musculoskeletal: Muscle strength 5/5 all ext  Psychiatric: Mood and affect normal  Neck: No JVD, no carotid bruits, no thyromegaly, no lymphadenopathy.  Lungs:Clear bilaterally, no wheezes, rhonci, crackles Cardiovascular: Irregular irregular. No murmurs, gallops or rubs. Abdomen:Soft. Bowel sounds present. Non-tender.  Extremities: No lower extremity edema. Pulses are 2 + in the bilateral DP/PT.  Echo 08/07/14: Left ventricle: The cavity size was normal. Wall thickness was normal. Indeterminant diastolic function (atrial fibrillation). Systolic function was mildly reduced. The estimated ejection fraction was in the range of 45% to 50%. Diffuse hypokinesis. - Aortic valve: There was no stenosis. There was mild regurgitation. - Mitral valve: Mildly calcified annulus.  Mildly calcified leaflets . There was mild to moderate regurgitation. - Left atrium: The atrium was severely dilated. - Right ventricle: The cavity size was normal. Systolic function was normal. - Right atrium: The atrium was moderately dilated. - Tricuspid valve: Peak RV-RA gradient (S): 29 mm Hg. - Pulmonary arteries: PA peak pressure: 32 mm Hg (S). - Inferior vena cava: The vessel was normal in size. The respirophasic diameter changes were in the normal range (>= 50%), consistent with normal central venous  pressure. Impressions: - The patient was in atrial fibrillation. Normal LV size with mild global hypokinesis, EF 45-50%. Severely dilated LA, moderately dilated RA. Normal RV size and systolic function. Mild to moderate MR. Mild AI.  EKG:  EKG is  ordered today. The ekg ordered today demonstrates Atrial fibrillation, rate 76 bpm. Low voltage. Poor R wave progression precordial leads.   Recent Labs: No results found for requested labs within last 8760 hours.   Lipid Panel Followed in primary care   Wt Readings from Last 3 Encounters:  09/19/16 85.2 kg (187 lb 12.8 oz)  01/23/16 83.9 kg (185 lb)  05/20/15 88 kg (194 lb)     Other studies Reviewed: Additional studies/ records that were reviewed today include: . Review of the above records demonstrates:    Assessment and Plan:   1. Atrial fibrillation,persisent: Rate controlled. She is in atrial fibrillation today. No palpitations at home. Continue coumadin. Cardizem stopped due to rash, dark spots on face and cough. Will continue  Toprol XL 25 mg po Qdaily. She will not be considered for DCCV as she is asymptomatic.   2.  Mitral regurgitation: Moderate MR by echo August 2015. Repeat echo now.    3. Chronic diastolic CHF: Volume stable with daily Lasix. She uses extra as needed for swelling.   Current medicines are reviewed at length with the patient today.  The patient does not have concerns regarding  medicines.  The following changes have been made:  no change  Labs/ tests ordered today include:   Orders Placed This Encounter  Procedures  . EKG 12-Lead  . ECHOCARDIOGRAM COMPLETE    Disposition:   FU with me in 12  months  Signed, Lauree Chandler, MD 09/19/2016 1:12 PM    [facility] [place], [place], [place]  [address] Phone: [phone]; Fax: [phone]

## 2016-09-19 ENCOUNTER — Ambulatory Visit (INDEPENDENT_AMBULATORY_CARE_PROVIDER_SITE_OTHER): Payer: Medicare Other | Admitting: Pharmacist

## 2016-09-19 ENCOUNTER — Encounter (INDEPENDENT_AMBULATORY_CARE_PROVIDER_SITE_OTHER): Payer: Self-pay

## 2016-09-19 ENCOUNTER — Encounter: Payer: Self-pay | Admitting: Cardiovascular Disease

## 2016-09-19 ENCOUNTER — Ambulatory Visit (INDEPENDENT_AMBULATORY_CARE_PROVIDER_SITE_OTHER): Payer: Medicare Other | Admitting: Cardiovascular Disease

## 2016-09-19 VITALS — BP 100/76 | HR 76 | Ht 66.0 in | Wt 187.8 lb

## 2016-09-19 DIAGNOSIS — I5032 Chronic diastolic (congestive) heart failure: Secondary | ICD-10-CM

## 2016-09-19 DIAGNOSIS — I340 Nonrheumatic mitral (valve) insufficiency: Secondary | ICD-10-CM

## 2016-09-19 DIAGNOSIS — I4819 Other persistent atrial fibrillation: Secondary | ICD-10-CM

## 2016-09-19 DIAGNOSIS — Z5181 Encounter for therapeutic drug level monitoring: Secondary | ICD-10-CM

## 2016-09-19 DIAGNOSIS — Z7901 Long term (current) use of anticoagulants: Secondary | ICD-10-CM

## 2016-09-19 DIAGNOSIS — I481 Persistent atrial fibrillation: Secondary | ICD-10-CM | POA: Diagnosis not present

## 2016-09-19 DIAGNOSIS — I4891 Unspecified atrial fibrillation: Secondary | ICD-10-CM | POA: Diagnosis not present

## 2016-09-19 LAB — POCT INR: INR: 2.9

## 2016-09-19 NOTE — Patient Instructions (Signed)

## 2016-10-31 ENCOUNTER — Ambulatory Visit (INDEPENDENT_AMBULATORY_CARE_PROVIDER_SITE_OTHER): Payer: Medicare Other | Admitting: *Deleted

## 2016-10-31 ENCOUNTER — Ambulatory Visit (HOSPITAL_COMMUNITY): Payer: Medicare Other | Attending: Cardiovascular Disease

## 2016-10-31 ENCOUNTER — Other Ambulatory Visit: Payer: Self-pay

## 2016-10-31 DIAGNOSIS — I4819 Other persistent atrial fibrillation: Secondary | ICD-10-CM

## 2016-10-31 DIAGNOSIS — I083 Combined rheumatic disorders of mitral, aortic and tricuspid valves: Secondary | ICD-10-CM | POA: Insufficient documentation

## 2016-10-31 DIAGNOSIS — I5032 Chronic diastolic (congestive) heart failure: Secondary | ICD-10-CM | POA: Diagnosis not present

## 2016-10-31 DIAGNOSIS — I340 Nonrheumatic mitral (valve) insufficiency: Secondary | ICD-10-CM

## 2016-10-31 DIAGNOSIS — Z7901 Long term (current) use of anticoagulants: Secondary | ICD-10-CM | POA: Diagnosis not present

## 2016-10-31 DIAGNOSIS — I481 Persistent atrial fibrillation: Secondary | ICD-10-CM

## 2016-10-31 DIAGNOSIS — I5020 Unspecified systolic (congestive) heart failure: Secondary | ICD-10-CM | POA: Diagnosis not present

## 2016-10-31 DIAGNOSIS — I4891 Unspecified atrial fibrillation: Secondary | ICD-10-CM | POA: Diagnosis not present

## 2016-10-31 DIAGNOSIS — Z5181 Encounter for therapeutic drug level monitoring: Secondary | ICD-10-CM

## 2016-10-31 LAB — POCT INR: INR: 3

## 2016-11-26 ENCOUNTER — Other Ambulatory Visit: Payer: Self-pay | Admitting: Cardiovascular Disease

## 2016-11-28 ENCOUNTER — Other Ambulatory Visit: Payer: Self-pay | Admitting: Cardiovascular Disease

## 2016-11-28 NOTE — Telephone Encounter (Signed)
Rx refill for Warfarin sent to pharmacy on 11/26/16 for 90 tablets, no refills.

## 2016-11-28 NOTE — Telephone Encounter (Signed)
Pt calling requesting a refill on her Warfarin 3 mg tablet. Please advised

## 2016-11-29 ENCOUNTER — Other Ambulatory Visit: Payer: Self-pay | Admitting: Cardiovascular Disease

## 2016-12-11 ENCOUNTER — Other Ambulatory Visit: Payer: Self-pay | Admitting: Internal Medicine

## 2016-12-11 DIAGNOSIS — Z1231 Encounter for screening mammogram for malignant neoplasm of breast: Secondary | ICD-10-CM

## 2016-12-12 ENCOUNTER — Ambulatory Visit (INDEPENDENT_AMBULATORY_CARE_PROVIDER_SITE_OTHER): Payer: Medicare Other | Admitting: *Deleted

## 2016-12-12 DIAGNOSIS — I4891 Unspecified atrial fibrillation: Secondary | ICD-10-CM

## 2016-12-12 DIAGNOSIS — Z7901 Long term (current) use of anticoagulants: Secondary | ICD-10-CM

## 2016-12-12 DIAGNOSIS — Z5181 Encounter for therapeutic drug level monitoring: Secondary | ICD-10-CM | POA: Diagnosis not present

## 2016-12-12 DIAGNOSIS — I4819 Other persistent atrial fibrillation: Secondary | ICD-10-CM

## 2016-12-12 DIAGNOSIS — I481 Persistent atrial fibrillation: Secondary | ICD-10-CM | POA: Diagnosis not present

## 2016-12-12 LAB — POCT INR: INR: 3.3

## 2017-01-01 ENCOUNTER — Ambulatory Visit
Admission: RE | Admit: 2017-01-01 | Discharge: 2017-01-01 | Disposition: A | Discharge status: Home / Self Care | Payer: Medicare Other | Source: Ambulatory Visit | Attending: Internal Medicine | Admitting: Internal Medicine

## 2017-01-01 DIAGNOSIS — Z1231 Encounter for screening mammogram for malignant neoplasm of breast: Secondary | ICD-10-CM

## 2017-01-09 ENCOUNTER — Ambulatory Visit (INDEPENDENT_AMBULATORY_CARE_PROVIDER_SITE_OTHER): Payer: Medicare Other | Admitting: *Deleted

## 2017-01-09 DIAGNOSIS — Z5181 Encounter for therapeutic drug level monitoring: Secondary | ICD-10-CM

## 2017-01-09 DIAGNOSIS — Z7901 Long term (current) use of anticoagulants: Secondary | ICD-10-CM | POA: Diagnosis not present

## 2017-01-09 DIAGNOSIS — I4891 Unspecified atrial fibrillation: Secondary | ICD-10-CM

## 2017-01-09 DIAGNOSIS — I481 Persistent atrial fibrillation: Secondary | ICD-10-CM

## 2017-01-09 DIAGNOSIS — I4819 Other persistent atrial fibrillation: Secondary | ICD-10-CM

## 2017-01-09 LAB — POCT INR: INR: 3

## 2017-02-06 ENCOUNTER — Ambulatory Visit (INDEPENDENT_AMBULATORY_CARE_PROVIDER_SITE_OTHER): Payer: Medicare Other | Admitting: *Deleted

## 2017-02-06 DIAGNOSIS — I4891 Unspecified atrial fibrillation: Secondary | ICD-10-CM | POA: Diagnosis not present

## 2017-02-06 DIAGNOSIS — I481 Persistent atrial fibrillation: Secondary | ICD-10-CM

## 2017-02-06 DIAGNOSIS — Z5181 Encounter for therapeutic drug level monitoring: Secondary | ICD-10-CM

## 2017-02-06 DIAGNOSIS — I4819 Other persistent atrial fibrillation: Secondary | ICD-10-CM

## 2017-02-06 DIAGNOSIS — Z7901 Long term (current) use of anticoagulants: Secondary | ICD-10-CM | POA: Diagnosis not present

## 2017-02-06 LAB — POCT INR: INR: 3.4

## 2017-02-27 ENCOUNTER — Ambulatory Visit (INDEPENDENT_AMBULATORY_CARE_PROVIDER_SITE_OTHER): Payer: Medicare Other | Admitting: *Deleted

## 2017-02-27 DIAGNOSIS — I481 Persistent atrial fibrillation: Secondary | ICD-10-CM

## 2017-02-27 DIAGNOSIS — I4891 Unspecified atrial fibrillation: Secondary | ICD-10-CM | POA: Diagnosis not present

## 2017-02-27 DIAGNOSIS — I4819 Other persistent atrial fibrillation: Secondary | ICD-10-CM

## 2017-02-27 DIAGNOSIS — Z5181 Encounter for therapeutic drug level monitoring: Secondary | ICD-10-CM

## 2017-02-27 DIAGNOSIS — Z7901 Long term (current) use of anticoagulants: Secondary | ICD-10-CM | POA: Diagnosis not present

## 2017-02-27 LAB — POCT INR: INR: 2.9

## 2017-03-20 ENCOUNTER — Other Ambulatory Visit: Payer: Self-pay | Admitting: Cardiovascular Disease

## 2017-03-27 ENCOUNTER — Ambulatory Visit (INDEPENDENT_AMBULATORY_CARE_PROVIDER_SITE_OTHER): Payer: Medicare Other | Admitting: *Deleted

## 2017-03-27 DIAGNOSIS — I4819 Other persistent atrial fibrillation: Secondary | ICD-10-CM

## 2017-03-27 DIAGNOSIS — I4891 Unspecified atrial fibrillation: Secondary | ICD-10-CM

## 2017-03-27 DIAGNOSIS — Z5181 Encounter for therapeutic drug level monitoring: Secondary | ICD-10-CM

## 2017-03-27 DIAGNOSIS — I481 Persistent atrial fibrillation: Secondary | ICD-10-CM

## 2017-03-27 DIAGNOSIS — Z7901 Long term (current) use of anticoagulants: Secondary | ICD-10-CM | POA: Diagnosis not present

## 2017-03-27 LAB — POCT INR: INR: 3.1

## 2017-04-11 ENCOUNTER — Ambulatory Visit (INDEPENDENT_AMBULATORY_CARE_PROVIDER_SITE_OTHER): Payer: Medicare Other | Admitting: *Deleted

## 2017-04-11 DIAGNOSIS — Z5181 Encounter for therapeutic drug level monitoring: Secondary | ICD-10-CM | POA: Diagnosis not present

## 2017-04-11 DIAGNOSIS — I4891 Unspecified atrial fibrillation: Secondary | ICD-10-CM

## 2017-04-11 DIAGNOSIS — I4819 Other persistent atrial fibrillation: Secondary | ICD-10-CM

## 2017-04-11 DIAGNOSIS — Z7901 Long term (current) use of anticoagulants: Secondary | ICD-10-CM

## 2017-04-11 DIAGNOSIS — I481 Persistent atrial fibrillation: Secondary | ICD-10-CM | POA: Diagnosis not present

## 2017-04-11 LAB — POCT INR: INR: 3.1

## 2017-04-25 ENCOUNTER — Ambulatory Visit (INDEPENDENT_AMBULATORY_CARE_PROVIDER_SITE_OTHER): Payer: Medicare Other | Admitting: *Deleted

## 2017-04-25 DIAGNOSIS — Z7901 Long term (current) use of anticoagulants: Secondary | ICD-10-CM

## 2017-04-25 DIAGNOSIS — Z5181 Encounter for therapeutic drug level monitoring: Secondary | ICD-10-CM | POA: Diagnosis not present

## 2017-04-25 DIAGNOSIS — I4819 Other persistent atrial fibrillation: Secondary | ICD-10-CM

## 2017-04-25 DIAGNOSIS — I481 Persistent atrial fibrillation: Secondary | ICD-10-CM

## 2017-04-25 DIAGNOSIS — I4891 Unspecified atrial fibrillation: Secondary | ICD-10-CM | POA: Diagnosis not present

## 2017-04-25 LAB — POCT INR: INR: 2.1

## 2017-05-16 ENCOUNTER — Ambulatory Visit (INDEPENDENT_AMBULATORY_CARE_PROVIDER_SITE_OTHER): Payer: Medicare Other | Admitting: *Deleted

## 2017-05-16 DIAGNOSIS — I4891 Unspecified atrial fibrillation: Secondary | ICD-10-CM

## 2017-05-16 DIAGNOSIS — Z7901 Long term (current) use of anticoagulants: Secondary | ICD-10-CM | POA: Diagnosis not present

## 2017-05-16 DIAGNOSIS — Z5181 Encounter for therapeutic drug level monitoring: Secondary | ICD-10-CM

## 2017-05-16 DIAGNOSIS — I481 Persistent atrial fibrillation: Secondary | ICD-10-CM

## 2017-05-16 DIAGNOSIS — I4819 Other persistent atrial fibrillation: Secondary | ICD-10-CM

## 2017-05-16 LAB — POCT INR: INR: 2

## 2017-06-06 ENCOUNTER — Ambulatory Visit (INDEPENDENT_AMBULATORY_CARE_PROVIDER_SITE_OTHER): Payer: Medicare Other | Admitting: *Deleted

## 2017-06-06 DIAGNOSIS — Z5181 Encounter for therapeutic drug level monitoring: Secondary | ICD-10-CM

## 2017-06-06 DIAGNOSIS — Z7901 Long term (current) use of anticoagulants: Secondary | ICD-10-CM

## 2017-06-06 DIAGNOSIS — I481 Persistent atrial fibrillation: Secondary | ICD-10-CM | POA: Diagnosis not present

## 2017-06-06 DIAGNOSIS — I4891 Unspecified atrial fibrillation: Secondary | ICD-10-CM

## 2017-06-06 DIAGNOSIS — I4819 Other persistent atrial fibrillation: Secondary | ICD-10-CM

## 2017-06-06 LAB — POCT INR: INR: 2.3

## 2017-07-11 ENCOUNTER — Ambulatory Visit (INDEPENDENT_AMBULATORY_CARE_PROVIDER_SITE_OTHER): Payer: Medicare Other | Admitting: *Deleted

## 2017-07-11 DIAGNOSIS — Z5181 Encounter for therapeutic drug level monitoring: Secondary | ICD-10-CM | POA: Diagnosis not present

## 2017-07-11 DIAGNOSIS — I4891 Unspecified atrial fibrillation: Secondary | ICD-10-CM

## 2017-07-11 DIAGNOSIS — I481 Persistent atrial fibrillation: Secondary | ICD-10-CM

## 2017-07-11 DIAGNOSIS — Z7901 Long term (current) use of anticoagulants: Secondary | ICD-10-CM | POA: Diagnosis not present

## 2017-07-11 DIAGNOSIS — I4819 Other persistent atrial fibrillation: Secondary | ICD-10-CM

## 2017-07-11 LAB — POCT INR: INR: 2.6

## 2017-08-06 ENCOUNTER — Other Ambulatory Visit: Payer: Self-pay | Admitting: Cardiovascular Disease

## 2017-08-22 ENCOUNTER — Ambulatory Visit (INDEPENDENT_AMBULATORY_CARE_PROVIDER_SITE_OTHER): Payer: Medicare Other

## 2017-08-22 DIAGNOSIS — Z5181 Encounter for therapeutic drug level monitoring: Secondary | ICD-10-CM | POA: Diagnosis not present

## 2017-08-22 DIAGNOSIS — Z7901 Long term (current) use of anticoagulants: Secondary | ICD-10-CM

## 2017-08-22 DIAGNOSIS — I4891 Unspecified atrial fibrillation: Secondary | ICD-10-CM

## 2017-08-22 DIAGNOSIS — I4819 Other persistent atrial fibrillation: Secondary | ICD-10-CM

## 2017-08-22 DIAGNOSIS — I481 Persistent atrial fibrillation: Secondary | ICD-10-CM

## 2017-08-22 LAB — POCT INR: INR: 2.2

## 2017-10-03 ENCOUNTER — Ambulatory Visit (INDEPENDENT_AMBULATORY_CARE_PROVIDER_SITE_OTHER): Payer: Medicare Other | Admitting: Pharmacist

## 2017-10-03 DIAGNOSIS — I4819 Other persistent atrial fibrillation: Secondary | ICD-10-CM

## 2017-10-03 DIAGNOSIS — I481 Persistent atrial fibrillation: Secondary | ICD-10-CM

## 2017-10-03 DIAGNOSIS — I4891 Unspecified atrial fibrillation: Secondary | ICD-10-CM

## 2017-10-03 DIAGNOSIS — Z5181 Encounter for therapeutic drug level monitoring: Secondary | ICD-10-CM | POA: Diagnosis not present

## 2017-10-03 DIAGNOSIS — Z7901 Long term (current) use of anticoagulants: Secondary | ICD-10-CM

## 2017-10-03 LAB — POCT INR: INR: 2.1

## 2017-11-14 ENCOUNTER — Ambulatory Visit (INDEPENDENT_AMBULATORY_CARE_PROVIDER_SITE_OTHER): Payer: Medicare Other

## 2017-11-14 DIAGNOSIS — Z7901 Long term (current) use of anticoagulants: Secondary | ICD-10-CM

## 2017-11-14 DIAGNOSIS — I481 Persistent atrial fibrillation: Secondary | ICD-10-CM

## 2017-11-14 DIAGNOSIS — I4819 Other persistent atrial fibrillation: Secondary | ICD-10-CM

## 2017-11-14 DIAGNOSIS — I4891 Unspecified atrial fibrillation: Secondary | ICD-10-CM

## 2017-11-14 DIAGNOSIS — Z5181 Encounter for therapeutic drug level monitoring: Secondary | ICD-10-CM | POA: Diagnosis not present

## 2017-11-14 LAB — POCT INR: INR: 2

## 2017-11-14 NOTE — Patient Instructions (Signed)
Continue taking 1/2 tablet every day.  Recheck INR in 6 weeks.  [facility] [phone]. Main # [phone].

## 2017-11-25 ENCOUNTER — Other Ambulatory Visit: Payer: Self-pay | Admitting: Internal Medicine

## 2017-11-25 DIAGNOSIS — Z1231 Encounter for screening mammogram for malignant neoplasm of breast: Secondary | ICD-10-CM

## 2017-12-26 ENCOUNTER — Ambulatory Visit (INDEPENDENT_AMBULATORY_CARE_PROVIDER_SITE_OTHER): Payer: Medicare Other | Admitting: Pharmacist

## 2017-12-26 DIAGNOSIS — Z7901 Long term (current) use of anticoagulants: Secondary | ICD-10-CM

## 2017-12-26 DIAGNOSIS — I481 Persistent atrial fibrillation: Secondary | ICD-10-CM

## 2017-12-26 DIAGNOSIS — I4891 Unspecified atrial fibrillation: Secondary | ICD-10-CM

## 2017-12-26 DIAGNOSIS — Z5181 Encounter for therapeutic drug level monitoring: Secondary | ICD-10-CM

## 2017-12-26 DIAGNOSIS — I4819 Other persistent atrial fibrillation: Secondary | ICD-10-CM

## 2017-12-26 LAB — POCT INR: INR: 2.5

## 2017-12-26 NOTE — Patient Instructions (Signed)
Description   Continue taking 1/2 tablet every day.  Recheck INR in 6 weeks.  [facility] [phone]. Main # [phone].

## 2018-01-02 ENCOUNTER — Ambulatory Visit
Admission: RE | Admit: 2018-01-02 | Discharge: 2018-01-02 | Disposition: A | Discharge status: Home / Self Care | Payer: Medicare Other | Source: Ambulatory Visit | Attending: Internal Medicine | Admitting: Internal Medicine

## 2018-01-02 DIAGNOSIS — Z1231 Encounter for screening mammogram for malignant neoplasm of breast: Secondary | ICD-10-CM

## 2018-01-13 ENCOUNTER — Encounter: Payer: Self-pay | Admitting: Cardiovascular Disease

## 2018-01-13 ENCOUNTER — Ambulatory Visit (INDEPENDENT_AMBULATORY_CARE_PROVIDER_SITE_OTHER): Payer: Medicare Other | Admitting: Cardiovascular Disease

## 2018-01-13 VITALS — BP 122/70 | HR 72 | Ht 66.0 in | Wt 182.8 lb

## 2018-01-13 DIAGNOSIS — I5032 Chronic diastolic (congestive) heart failure: Secondary | ICD-10-CM | POA: Diagnosis not present

## 2018-01-13 DIAGNOSIS — I340 Nonrheumatic mitral (valve) insufficiency: Secondary | ICD-10-CM

## 2018-01-13 DIAGNOSIS — I481 Persistent atrial fibrillation: Secondary | ICD-10-CM

## 2018-01-13 DIAGNOSIS — I4819 Other persistent atrial fibrillation: Secondary | ICD-10-CM

## 2018-01-13 NOTE — Patient Instructions (Signed)
Medication Instructions:  Your physician recommends that you continue on your current medications as directed. Please refer to the Current Medication list given to you today.   Labwork: none  Testing/Procedures: Your physician has requested that you have an echocardiogram. Echocardiography is a painless test that uses sound waves to create images of your heart. It provides your doctor with information about the size and shape of your heart and how well your heart's chambers and valves are working. This procedure takes approximately one hour. There are no restrictions for this procedure.  To be done summer 2019  Follow-Up: Your physician recommends that you schedule a follow-up appointment in: 12 months. Please call our office in about 9 months to schedule this appointment    Any Other Special Instructions Will Be Listed Below (If Applicable).     If you need a refill on your cardiac medications before your next appointment, please call your pharmacy.

## 2018-01-13 NOTE — Progress Notes (Signed)
Chief Complaint  Patient presents with  . Follow-up    AFIB   History of Present Illness: 84 yo female with history of colon cancer, hypothyroidism, HLD, HTN, mitral regurgitation, aortic valve insufficiency, hemochromatosis, paroxysmal atrial fibrillation here today for cardiac follow up. When I met her in September of 2012, she was in sinus rhythm. She has been continued on coumadin. Echo September 2013 showed mild AI, moderate MR, normal LV function. She underwent laparoscopic lysis of adhesions, biopsy of the liver mass, a laparoscopic right colectomy on April 09, 2011 per Dr. Dalbert Batman. Final pathology report showed a benign tubular adenoma of the colon, a degenerating hyalinized cyst of the liver. She was seen by Richardson Dopp, PA-C July 2015 and c/o LE edema. Lasix was increased. Echo November 2017 with LVEF=40%, mild to moderate AI, moderate MR.     She is here today for follow up. The patient denies any chest pain, dyspnea, palpitations, lower extremity edema, orthopnea, PND, dizziness, near syncope or syncope.   Primary Care Physician: Lorene Dy, MD   Past Medical History:  Diagnosis Date  . Arthritis   . Arthritis pain   . Atrial fibrillation ([place])   . Breast cancer ([place]) 1991   right  . Cancer ([place])   . Constipation   . Environmental allergies   . Hemochromatosis 1998  . History of colon cancer   . HTN (hypertension)   . Hx of echocardiogram    Echo (8/15):  EF 45-50%, diff HK, mild AI, MAC, mild to mod MR, severe LAE, mod RAE, PASP 32 mmHg  . PONV (postoperative nausea and vomiting)   . Thyroid disease   . Varicose vein of leg     Past Surgical History:  Procedure Laterality Date  . BREAST LUMPECTOMY Right 1991  . BREAST SURGERY  1991   Rt br lump removed-benign  . COLON SURGERY  1996   CANCER REMOVAL  . GALLBLADDER SURGERY  2005  . LIVER BIOPSY  04/08/2012   Procedure: LIVER BIOPSY;  Surgeon: Adin Hector, MD;  Location: [facility];  Service: General;;   laparoscopic  . TOTAL HIP ARTHROPLASTY  2004   RIGHT    Current Outpatient Medications  Medication Sig Dispense Refill  . acetaminophen (TYLENOL) 500 MG tablet Take 500 mg by mouth every 6 (six) hours as needed. For pain    . atorvastatin (LIPITOR) 10 MG tablet Take 10 mg by mouth Daily.     . furosemide (LASIX) 20 MG tablet Take 20 mg by mouth Daily. EXTRA 20 MG PRN FOR EDEMA PT STATES PER PCP    . gabapentin (NEURONTIN) 100 MG capsule Take 1 capsule by mouth at bedtime as needed (pain).     Marland Kitchen guaiFENesin (MUCINEX) 600 MG 12 hr tablet Take 1 tablet (600 mg total) by mouth 2 (two) times daily. (Patient taking differently: Take 600 mg by mouth 2 (two) times daily as needed for cough. ) 14 tablet 0  . HYDROcodone-acetaminophen (NORCO) 5-325 MG tablet Take 1 tablet by mouth every 4 (four) hours as needed. 20 tablet 0  . levothyroxine (SYNTHROID, LEVOTHROID) 50 MCG tablet Take 25 mcg by mouth Daily.     Marland Kitchen losartan (COZAAR) 50 MG tablet Take 50 mg by mouth daily. MG  QD  . meclizine (ANTIVERT) 25 MG tablet Take 25 mg by mouth 2 (two) times daily as needed for dizziness.     . metoprolol succinate (TOPROL XL) 25 MG 24 hr tablet Take 1 tablet (25 mg  total) by mouth daily. (Patient taking differently: Take 25 mg by mouth at bedtime. ) 90 tablet 3  . Omega-3 Fatty Acids (FISH OIL) 1200 MG CAPS Take 1 capsule by mouth daily.     . polyethylene glycol (MIRALAX / GLYCOLAX) packet Take 17 g by mouth daily as needed. For constipation    . senna (SENOKOT) 8.6 MG TABS Take 1 tablet by mouth 2 (two) times daily. For constipation    . vitamin E 400 UNIT capsule Take 400 Units by mouth daily.      Marland Kitchen warfarin (COUMADIN) 3 MG tablet TAKE AS DIRECTED BY COUMADIN CLINIC 90 tablet 0   No current facility-administered medications for this visit.     Allergies  Allergen Reactions  . Tramadol Other (See Comments)    MENTAL CHANGES, VERY SNAPPY, MADE PATIENT FEEL WORSE AND PATIENT PREFERENCE NOT TO TAKE MED  .  Adhesive [Tape] Rash  . Cardizem [Diltiazem Hcl] Cough    Rash that caused dark spotting   . Latex Rash    Per Patient Latex=lip swelling    Social History   Socioeconomic History  . Marital status: Married    Spouse name: Not on file  . Number of children: Not on file  . Years of education: Not on file  . Highest education level: Not on file  Social Needs  . Financial resource strain: Not on file  . Food insecurity - worry: Not on file  . Food insecurity - inability: Not on file  . Transportation needs - medical: Not on file  . Transportation needs - non-medical: Not on file  Occupational History  . Occupation: RETIRED    Employer: [employer]  Tobacco Use  . Smoking status: Never Smoker  . Smokeless tobacco: Never Used  Substance and Sexual Activity  . Alcohol use: No  . Drug use: No  . Sexual activity: Not on file  Other Topics Concern  . Not on file  Social History Narrative  . Not on file    Family History  Problem Relation Age of Onset  . Heart disease Mother   . Stroke Mother   . Heart disease Father   . Pneumonia Brother   . Hypertension Maternal Grandfather   . Heart attack Neg Hx     Review of Systems:  As stated in the HPI and otherwise negative.   BP 122/70   Pulse 72   Ht _0  (1.676 m)   Wt 182 lb 12.8 oz (82.9 kg)   SpO2 98%   BMI 29.50 kg/m   Physical Examination:  General: Well developed, well nourished, NAD  HEENT: OP clear, mucus membranes moist  SKIN: warm, dry. No rashes. Neuro: No focal deficits  Musculoskeletal: Muscle strength 5/5 all ext  Psychiatric: Mood and affect normal  Neck: No JVD, no carotid bruits, no thyromegaly, no lymphadenopathy.  Lungs:Clear bilaterally, no wheezes, rhonci, crackles Cardiovascular: Irreg irreg. Systolic murmur.  Abdomen:Soft. Bowel sounds present. Non-tender.  Extremities: No lower extremity edema. Pulses are 2 + in the bilateral DP/PT.  Echo 10/31/16: - Left ventricle: The cavity size was  normal. Wall thickness was   normal. Systolic function was moderately reduced. The estimated   ejection fraction was in the range of 35% to 40%. - Aortic valve: There was mild to moderate regurgitation. - Mitral valve: There was moderate regurgitation. - Left atrium: The atrium was moderately dilated. - Right ventricle: The cavity size was mildly dilated. - Right atrium: The atrium was severely dilated. -  Tricuspid valve: There was mild-moderate regurgitation.  EKG:  EKG is ordered today. The ekg ordered today demonstrates atrial fib, rate 72 bpm. Non-specific ST abn  Recent Labs: No results found for requested labs within last 8760 hours.   Lipid Panel Followed in primary care   Wt Readings from Last 3 Encounters:  01/13/18 182 lb 12.8 oz (82.9 kg)  09/19/16 187 lb 12.8 oz (85.2 kg)  01/23/16 185 lb (83.9 kg)     Other studies Reviewed: Additional studies/ records that were reviewed today include: . Review of the above records demonstrates:    Assessment and Plan:   1. Atrial fibrillation,persisent: She is in atrial fib. Rate is controlled. She has no awareness of her arrhythmia. Cardizem stopped due to rash, dark spots on face and cough. Will continue Toprol for rate control and Coumadin for anti-coagulation.    2.  Mitral regurgitation: Moderate MR by echo 2017. Repeat echo this summer.   3. Chronic diastolic CHF: Volume status is ok. Weight is stable. Continue Lasix.   4. Aortic valve insufficiency: Mild to moderate by echo 2017.   Current medicines are reviewed at length with the patient today.  The patient does not have concerns regarding medicines.  The following changes have been made:  no change  Labs/ tests ordered today include:   Orders Placed This Encounter  Procedures  . ECHOCARDIOGRAM COMPLETE    Disposition:   FU with me in 12  months  Signed, Lauree Chandler, MD 01/13/2018 12:09 PM    [facility],  [place], [place]  [address] Phone: [phone]; Fax: [phone]

## 2018-01-14 NOTE — Addendum Note (Signed)
Addended by: Mendel Ryder on: 01/14/2018 07:23 AM   Modules accepted: Orders

## 2018-01-17 ENCOUNTER — Other Ambulatory Visit: Payer: Self-pay | Admitting: *Deleted

## 2018-01-17 MED ORDER — WARFARIN SODIUM 3 MG PO TABS: ORAL_TABLET | ORAL | 0 refills | Status: DC

## 2018-02-11 ENCOUNTER — Ambulatory Visit (INDEPENDENT_AMBULATORY_CARE_PROVIDER_SITE_OTHER): Payer: Medicare Other

## 2018-02-11 DIAGNOSIS — I481 Persistent atrial fibrillation: Secondary | ICD-10-CM

## 2018-02-11 DIAGNOSIS — Z7901 Long term (current) use of anticoagulants: Secondary | ICD-10-CM

## 2018-02-11 DIAGNOSIS — Z5181 Encounter for therapeutic drug level monitoring: Secondary | ICD-10-CM

## 2018-02-11 DIAGNOSIS — I4891 Unspecified atrial fibrillation: Secondary | ICD-10-CM | POA: Diagnosis not present

## 2018-02-11 DIAGNOSIS — I4819 Other persistent atrial fibrillation: Secondary | ICD-10-CM

## 2018-02-11 LAB — POCT INR: INR: 1.4

## 2018-02-11 NOTE — Patient Instructions (Signed)
Description   Take 1 tablet today and tomorrow, then resume same dosage 1/2 tablet every day.  Recheck INR in 2 weeks.  [facility] [phone]. Main # [phone].

## 2018-02-25 ENCOUNTER — Ambulatory Visit (INDEPENDENT_AMBULATORY_CARE_PROVIDER_SITE_OTHER): Payer: Medicare Other | Admitting: *Deleted

## 2018-02-25 DIAGNOSIS — Z7901 Long term (current) use of anticoagulants: Secondary | ICD-10-CM | POA: Diagnosis not present

## 2018-02-25 DIAGNOSIS — I4891 Unspecified atrial fibrillation: Secondary | ICD-10-CM | POA: Diagnosis not present

## 2018-02-25 DIAGNOSIS — Z5181 Encounter for therapeutic drug level monitoring: Secondary | ICD-10-CM

## 2018-02-25 DIAGNOSIS — I481 Persistent atrial fibrillation: Secondary | ICD-10-CM | POA: Diagnosis not present

## 2018-02-25 DIAGNOSIS — I4819 Other persistent atrial fibrillation: Secondary | ICD-10-CM

## 2018-02-25 LAB — POCT INR: INR: 2.3

## 2018-02-25 NOTE — Patient Instructions (Signed)
Description   Continue taking same dosage of 1/2 tablet every day.  Recheck INR in 4 weeks. [facility] [phone]. Main # [phone].      

## 2018-03-18 ENCOUNTER — Ambulatory Visit (INDEPENDENT_AMBULATORY_CARE_PROVIDER_SITE_OTHER): Payer: Medicare Other | Admitting: *Deleted

## 2018-03-18 DIAGNOSIS — Z7901 Long term (current) use of anticoagulants: Secondary | ICD-10-CM | POA: Diagnosis not present

## 2018-03-18 DIAGNOSIS — I4891 Unspecified atrial fibrillation: Secondary | ICD-10-CM

## 2018-03-18 DIAGNOSIS — Z5181 Encounter for therapeutic drug level monitoring: Secondary | ICD-10-CM | POA: Diagnosis not present

## 2018-03-18 DIAGNOSIS — I4819 Other persistent atrial fibrillation: Secondary | ICD-10-CM

## 2018-03-18 DIAGNOSIS — I481 Persistent atrial fibrillation: Secondary | ICD-10-CM | POA: Diagnosis not present

## 2018-03-18 LAB — POCT INR: INR: 2.2

## 2018-03-18 NOTE — Patient Instructions (Signed)
Description   Continue taking same dosage of 1/2 tablet every day.  Recheck INR in 4 weeks. [facility] [phone]. Main # [phone].      

## 2018-04-15 ENCOUNTER — Ambulatory Visit (INDEPENDENT_AMBULATORY_CARE_PROVIDER_SITE_OTHER): Payer: Medicare Other | Admitting: *Deleted

## 2018-04-15 DIAGNOSIS — I481 Persistent atrial fibrillation: Secondary | ICD-10-CM | POA: Diagnosis not present

## 2018-04-15 DIAGNOSIS — Z7901 Long term (current) use of anticoagulants: Secondary | ICD-10-CM | POA: Diagnosis not present

## 2018-04-15 DIAGNOSIS — I4819 Other persistent atrial fibrillation: Secondary | ICD-10-CM

## 2018-04-15 DIAGNOSIS — Z5181 Encounter for therapeutic drug level monitoring: Secondary | ICD-10-CM

## 2018-04-15 DIAGNOSIS — I4891 Unspecified atrial fibrillation: Secondary | ICD-10-CM | POA: Diagnosis not present

## 2018-04-15 LAB — POCT INR: INR: 1.7

## 2018-04-15 NOTE — Patient Instructions (Signed)
Description   Continue taking same dosage of 1/2 tablet every day.  Recheck INR on Friday April 19th as she started Septra DS bid for 1 week today April 16th  [facility] [phone]. Main # [phone].

## 2018-04-18 ENCOUNTER — Ambulatory Visit (INDEPENDENT_AMBULATORY_CARE_PROVIDER_SITE_OTHER): Payer: Medicare Other | Admitting: Pharmacist

## 2018-04-18 DIAGNOSIS — Z7901 Long term (current) use of anticoagulants: Secondary | ICD-10-CM

## 2018-04-18 DIAGNOSIS — I4819 Other persistent atrial fibrillation: Secondary | ICD-10-CM

## 2018-04-18 DIAGNOSIS — I4891 Unspecified atrial fibrillation: Secondary | ICD-10-CM

## 2018-04-18 DIAGNOSIS — Z5181 Encounter for therapeutic drug level monitoring: Secondary | ICD-10-CM

## 2018-04-18 DIAGNOSIS — I481 Persistent atrial fibrillation: Secondary | ICD-10-CM | POA: Diagnosis not present

## 2018-04-18 LAB — POCT INR: INR: 2.5

## 2018-04-18 NOTE — Patient Instructions (Signed)
Description   Continue taking same dosage of 1/2 tablet every day except skip your dose on Monday (finishing Bactrim that night).  Recheck INR in 3 weeks. [facility] Clinic [phone]. Main # [phone].

## 2018-05-07 ENCOUNTER — Ambulatory Visit (INDEPENDENT_AMBULATORY_CARE_PROVIDER_SITE_OTHER): Payer: Medicare Other | Admitting: *Deleted

## 2018-05-07 DIAGNOSIS — I481 Persistent atrial fibrillation: Secondary | ICD-10-CM | POA: Diagnosis not present

## 2018-05-07 DIAGNOSIS — Z5181 Encounter for therapeutic drug level monitoring: Secondary | ICD-10-CM

## 2018-05-07 DIAGNOSIS — I4819 Other persistent atrial fibrillation: Secondary | ICD-10-CM

## 2018-05-07 DIAGNOSIS — Z7901 Long term (current) use of anticoagulants: Secondary | ICD-10-CM

## 2018-05-07 DIAGNOSIS — I4891 Unspecified atrial fibrillation: Secondary | ICD-10-CM | POA: Diagnosis not present

## 2018-05-07 LAB — POCT INR: INR: 1.6

## 2018-05-07 NOTE — Patient Instructions (Signed)
Description   Today take 1 tablet then continue taking same dosage of 1/2 tablet every day.  Recheck INR in 3 weeks. [facility] [phone]. Main # [phone].

## 2018-05-19 ENCOUNTER — Telehealth: Payer: Self-pay | Admitting: *Deleted

## 2018-05-19 NOTE — Telephone Encounter (Signed)
Pt called and states she was sick over the weekend and went to MD and he ordered  Zpak Tessalon pearls and Prednisone 20 mg daily for 10 days She is starting Prednisone today so pt instructed to take Zpak ,Tessalon pearls and Prednisone as ordered as well as take coumadin as ordered and made an appt for her to be seen in coumadin clinic on Thursday to recheck INR as pt instructed that Prednisone can elevate INR and she states understanding

## 2018-05-22 ENCOUNTER — Ambulatory Visit (INDEPENDENT_AMBULATORY_CARE_PROVIDER_SITE_OTHER): Payer: Medicare Other | Admitting: *Deleted

## 2018-05-22 DIAGNOSIS — I481 Persistent atrial fibrillation: Secondary | ICD-10-CM | POA: Diagnosis not present

## 2018-05-22 DIAGNOSIS — Z5181 Encounter for therapeutic drug level monitoring: Secondary | ICD-10-CM

## 2018-05-22 DIAGNOSIS — Z7901 Long term (current) use of anticoagulants: Secondary | ICD-10-CM | POA: Diagnosis not present

## 2018-05-22 DIAGNOSIS — I4819 Other persistent atrial fibrillation: Secondary | ICD-10-CM

## 2018-05-22 DIAGNOSIS — I4891 Unspecified atrial fibrillation: Secondary | ICD-10-CM | POA: Diagnosis not present

## 2018-05-22 LAB — POCT INR: INR: 2.2 (ref 2.0–3.0)

## 2018-05-22 NOTE — Patient Instructions (Signed)
Description   Continue taking same dosage of 1/2 tablet every day.  Recheck INR in 4 weeks. [facility] [phone]. Main # [phone].      

## 2018-06-19 ENCOUNTER — Ambulatory Visit (INDEPENDENT_AMBULATORY_CARE_PROVIDER_SITE_OTHER): Payer: Medicare Other

## 2018-06-19 DIAGNOSIS — I481 Persistent atrial fibrillation: Secondary | ICD-10-CM | POA: Diagnosis not present

## 2018-06-19 DIAGNOSIS — I4891 Unspecified atrial fibrillation: Secondary | ICD-10-CM

## 2018-06-19 DIAGNOSIS — Z5181 Encounter for therapeutic drug level monitoring: Secondary | ICD-10-CM

## 2018-06-19 DIAGNOSIS — Z7901 Long term (current) use of anticoagulants: Secondary | ICD-10-CM | POA: Diagnosis not present

## 2018-06-19 DIAGNOSIS — I4819 Other persistent atrial fibrillation: Secondary | ICD-10-CM

## 2018-06-19 LAB — POCT INR: INR: 2.1 (ref 2.0–3.0)

## 2018-06-19 NOTE — Patient Instructions (Signed)
Description   Continue taking same dosage of 1/2 tablet every day.  Recheck INR in 4 weeks. [facility] [phone]. Main # [phone].      

## 2018-07-01 ENCOUNTER — Other Ambulatory Visit: Payer: Self-pay

## 2018-07-01 ENCOUNTER — Ambulatory Visit (HOSPITAL_COMMUNITY): Payer: Medicare Other | Attending: Cardiovascular Disease

## 2018-07-01 DIAGNOSIS — I4891 Unspecified atrial fibrillation: Secondary | ICD-10-CM | POA: Diagnosis not present

## 2018-07-01 DIAGNOSIS — I340 Nonrheumatic mitral (valve) insufficiency: Secondary | ICD-10-CM | POA: Diagnosis present

## 2018-07-01 DIAGNOSIS — I083 Combined rheumatic disorders of mitral, aortic and tricuspid valves: Secondary | ICD-10-CM | POA: Diagnosis not present

## 2018-07-17 ENCOUNTER — Ambulatory Visit (INDEPENDENT_AMBULATORY_CARE_PROVIDER_SITE_OTHER): Payer: Medicare Other | Admitting: *Deleted

## 2018-07-17 DIAGNOSIS — I481 Persistent atrial fibrillation: Secondary | ICD-10-CM | POA: Diagnosis not present

## 2018-07-17 DIAGNOSIS — Z5181 Encounter for therapeutic drug level monitoring: Secondary | ICD-10-CM

## 2018-07-17 DIAGNOSIS — I4819 Other persistent atrial fibrillation: Secondary | ICD-10-CM

## 2018-07-17 DIAGNOSIS — Z7901 Long term (current) use of anticoagulants: Secondary | ICD-10-CM | POA: Diagnosis not present

## 2018-07-17 DIAGNOSIS — I4891 Unspecified atrial fibrillation: Secondary | ICD-10-CM

## 2018-07-17 LAB — POCT INR: INR: 1.9 — AB (ref 2.0–3.0)

## 2018-07-17 NOTE — Patient Instructions (Signed)
Description   Today take 1 tablet, then continue taking same dosage of 1/2 tablet every day.  Recheck INR in 3 weeks. [facility] [phone]. Main # [phone].

## 2018-07-18 ENCOUNTER — Other Ambulatory Visit: Payer: Self-pay | Admitting: Cardiovascular Disease

## 2018-08-08 ENCOUNTER — Ambulatory Visit (INDEPENDENT_AMBULATORY_CARE_PROVIDER_SITE_OTHER): Payer: Medicare Other

## 2018-08-08 DIAGNOSIS — Z7901 Long term (current) use of anticoagulants: Secondary | ICD-10-CM

## 2018-08-08 DIAGNOSIS — Z5181 Encounter for therapeutic drug level monitoring: Secondary | ICD-10-CM | POA: Diagnosis not present

## 2018-08-08 DIAGNOSIS — I4819 Other persistent atrial fibrillation: Secondary | ICD-10-CM

## 2018-08-08 DIAGNOSIS — I481 Persistent atrial fibrillation: Secondary | ICD-10-CM | POA: Diagnosis not present

## 2018-08-08 DIAGNOSIS — I4891 Unspecified atrial fibrillation: Secondary | ICD-10-CM

## 2018-08-08 LAB — POCT INR: INR: 2.3 (ref 2.0–3.0)

## 2018-08-08 NOTE — Patient Instructions (Signed)
Description   Continue taking same dosage of 1/2 tablet every day.  Recheck INR in 4 weeks. [facility] [phone]. Main # [phone].      

## 2018-09-05 ENCOUNTER — Ambulatory Visit (INDEPENDENT_AMBULATORY_CARE_PROVIDER_SITE_OTHER): Payer: Medicare Other | Admitting: *Deleted

## 2018-09-05 DIAGNOSIS — Z7901 Long term (current) use of anticoagulants: Secondary | ICD-10-CM

## 2018-09-05 DIAGNOSIS — I481 Persistent atrial fibrillation: Secondary | ICD-10-CM | POA: Diagnosis not present

## 2018-09-05 DIAGNOSIS — I4819 Other persistent atrial fibrillation: Secondary | ICD-10-CM

## 2018-09-05 DIAGNOSIS — Z5181 Encounter for therapeutic drug level monitoring: Secondary | ICD-10-CM

## 2018-09-05 DIAGNOSIS — I4891 Unspecified atrial fibrillation: Secondary | ICD-10-CM

## 2018-09-05 LAB — POCT INR: INR: 2.1 (ref 2.0–3.0)

## 2018-09-05 NOTE — Patient Instructions (Signed)
Description   Continue taking same dosage of 1/2 tablet every day.  Recheck INR in 4 weeks. [facility] [phone]. Main # [phone].      

## 2018-09-14 ENCOUNTER — Observation Stay (HOSPITAL_COMMUNITY)
Admission: EM | Admit: 2018-09-14 | Discharge: 2018-09-15 | Disposition: A | Discharge status: Home / Self Care | Payer: Medicare Other | Attending: Internal Medicine | Admitting: Internal Medicine

## 2018-09-14 ENCOUNTER — Other Ambulatory Visit: Payer: Self-pay

## 2018-09-14 ENCOUNTER — Emergency Department (HOSPITAL_COMMUNITY): Payer: Medicare Other

## 2018-09-14 ENCOUNTER — Encounter (HOSPITAL_COMMUNITY): Payer: Self-pay

## 2018-09-14 DIAGNOSIS — G629 Polyneuropathy, unspecified: Secondary | ICD-10-CM | POA: Insufficient documentation

## 2018-09-14 DIAGNOSIS — Z85038 Personal history of other malignant neoplasm of large intestine: Secondary | ICD-10-CM | POA: Insufficient documentation

## 2018-09-14 DIAGNOSIS — Z7901 Long term (current) use of anticoagulants: Secondary | ICD-10-CM | POA: Insufficient documentation

## 2018-09-14 DIAGNOSIS — Z23 Encounter for immunization: Secondary | ICD-10-CM | POA: Diagnosis not present

## 2018-09-14 DIAGNOSIS — I110 Hypertensive heart disease with heart failure: Secondary | ICD-10-CM | POA: Insufficient documentation

## 2018-09-14 DIAGNOSIS — J181 Lobar pneumonia, unspecified organism: Secondary | ICD-10-CM | POA: Diagnosis not present

## 2018-09-14 DIAGNOSIS — I4891 Unspecified atrial fibrillation: Secondary | ICD-10-CM | POA: Insufficient documentation

## 2018-09-14 DIAGNOSIS — R42 Dizziness and giddiness: Secondary | ICD-10-CM | POA: Diagnosis not present

## 2018-09-14 DIAGNOSIS — R509 Fever, unspecified: Secondary | ICD-10-CM | POA: Diagnosis present

## 2018-09-14 DIAGNOSIS — K219 Gastro-esophageal reflux disease without esophagitis: Secondary | ICD-10-CM | POA: Insufficient documentation

## 2018-09-14 DIAGNOSIS — J189 Pneumonia, unspecified organism: Secondary | ICD-10-CM

## 2018-09-14 DIAGNOSIS — I509 Heart failure, unspecified: Secondary | ICD-10-CM | POA: Insufficient documentation

## 2018-09-14 DIAGNOSIS — K5900 Constipation, unspecified: Secondary | ICD-10-CM | POA: Diagnosis not present

## 2018-09-14 DIAGNOSIS — Z79899 Other long term (current) drug therapy: Secondary | ICD-10-CM | POA: Diagnosis not present

## 2018-09-14 DIAGNOSIS — Z96641 Presence of right artificial hip joint: Secondary | ICD-10-CM | POA: Diagnosis not present

## 2018-09-14 DIAGNOSIS — Z853 Personal history of malignant neoplasm of breast: Secondary | ICD-10-CM | POA: Insufficient documentation

## 2018-09-14 LAB — URINALYSIS, ROUTINE W REFLEX MICROSCOPIC
BACTERIA UA: NONE SEEN
Bilirubin Urine: NEGATIVE
Glucose, UA: NEGATIVE mg/dL
Ketones, ur: 5 mg/dL — AB
LEUKOCYTES UA: NEGATIVE
NITRITE: NEGATIVE
PH: 5 (ref 5.0–8.0)
Protein, ur: NEGATIVE mg/dL
Specific Gravity, Urine: 1.019 (ref 1.005–1.030)

## 2018-09-14 LAB — BASIC METABOLIC PANEL
ANION GAP: 7 (ref 5–15)
BUN: 20 mg/dL (ref 8–23)
CHLORIDE: 108 mmol/L (ref 98–111)
CO2: 28 mmol/L (ref 22–32)
CREATININE: 0.55 mg/dL (ref 0.44–1.00)
Calcium: 9.7 mg/dL (ref 8.9–10.3)
GFR calc Af Amer: >60 mL/min (ref >60)
GFR calc non Af Amer: >60 mL/min (ref >60)
GLUCOSE: 100 mg/dL — AB (ref 70–99)
Potassium: 3.6 mmol/L (ref 3.5–5.1)
Sodium: 143 mmol/L (ref 135–145)

## 2018-09-14 LAB — CBC WITH DIFFERENTIAL/PLATELET
BASOS ABS: 0 10*3/uL (ref 0.0–0.1)
Basophils Relative: 0 %
EOS ABS: 0 10*3/uL (ref 0.0–0.7)
Eosinophils Relative: 0 %
HCT: 42.1 % (ref 36.0–46.0)
Hemoglobin: 13.6 g/dL (ref 12.0–15.0)
Lymphocytes Relative: 16 %
Lymphs Abs: 1.5 10*3/uL (ref 0.7–4.0)
MCH: 32.2 pg (ref 26.0–34.0)
MCHC: 32.3 g/dL (ref 30.0–36.0)
MCV: 99.8 fL (ref 78.0–100.0)
MONOS PCT: 8 %
Monocytes Absolute: 0.8 10*3/uL (ref 0.1–1.0)
NEUTROS ABS: 7.2 10*3/uL (ref 1.7–7.7)
NEUTROS PCT: 76 %
PLATELETS: 142 10*3/uL — AB (ref 150–400)
RBC: 4.22 MIL/uL (ref 3.87–5.11)
RDW: 12.7 % (ref 11.5–15.5)
WBC: 9.5 10*3/uL (ref 4.0–10.5)

## 2018-09-14 LAB — I-STAT CG4 LACTIC ACID, ED: Lactic Acid, Venous: 0.88 mmol/L (ref 0.5–1.9)

## 2018-09-14 LAB — PROTIME-INR
INR: 2.43
PROTHROMBIN TIME: 26.2 s — AB (ref 11.4–15.2)

## 2018-09-14 MED ORDER — POLYETHYLENE GLYCOL 3350 17 G PO PACK: 17.0000 g | PACK | Freq: Every day | ORAL | Status: DC | PRN

## 2018-09-14 MED ORDER — SENNA 8.6 MG PO TABS: 1.0000 | ORAL_TABLET | Freq: Two times a day (BID) | ORAL | Status: DC | PRN

## 2018-09-14 MED ORDER — GUAIFENESIN ER 600 MG PO TB12
600.0000 mg | ORAL_TABLET | Freq: Two times a day (BID) | ORAL | Status: DC
  Administered 2018-09-14 – 2018-09-15 (×2): 600 mg via ORAL
  Filled 2018-09-14 (×2): qty 1

## 2018-09-14 MED ORDER — SODIUM CHLORIDE 0.9 % IV BOLUS
1000.0000 mL | Freq: Once | INTRAVENOUS | Status: AC
  Administered 2018-09-14: 1000 mL via INTRAVENOUS

## 2018-09-14 MED ORDER — LISINOPRIL 5 MG PO TABS
2.5000 mg | ORAL_TABLET | Freq: Every day | ORAL | Status: DC
  Administered 2018-09-15: 2.5 mg via ORAL
  Filled 2018-09-14: qty 1

## 2018-09-14 MED ORDER — WARFARIN - PHARMACIST DOSING INPATIENT: Freq: Every day | Status: DC

## 2018-09-14 MED ORDER — MECLIZINE HCL 25 MG PO TABS: 25.0000 mg | ORAL_TABLET | Freq: Two times a day (BID) | ORAL | Status: DC | PRN

## 2018-09-14 MED ORDER — GABAPENTIN 100 MG PO CAPS
100.0000 mg | ORAL_CAPSULE | Freq: Every day | ORAL | Status: DC
  Administered 2018-09-14: 100 mg via ORAL
  Filled 2018-09-14: qty 1

## 2018-09-14 MED ORDER — WARFARIN SODIUM 1 MG PO TABS
1.0000 mg | ORAL_TABLET | Freq: Once | ORAL | Status: DC
  Filled 2018-09-14: qty 1

## 2018-09-14 MED ORDER — PANTOPRAZOLE SODIUM 40 MG PO TBEC
40.0000 mg | DELAYED_RELEASE_TABLET | Freq: Every day | ORAL | Status: DC
  Administered 2018-09-15: 40 mg via ORAL
  Filled 2018-09-14: qty 1

## 2018-09-14 MED ORDER — LEVOTHYROXINE SODIUM 50 MCG PO TABS
50.0000 ug | ORAL_TABLET | Freq: Every day | ORAL | Status: DC
  Administered 2018-09-15: 50 ug via ORAL
  Filled 2018-09-14: qty 1

## 2018-09-14 MED ORDER — WARFARIN SODIUM 1 MG PO TABS: 1.5000 mg | ORAL_TABLET | Freq: Every day | ORAL | Status: DC

## 2018-09-14 MED ORDER — METOPROLOL SUCCINATE ER 25 MG PO TB24
25.0000 mg | ORAL_TABLET | Freq: Every day | ORAL | Status: DC
  Administered 2018-09-14: 25 mg via ORAL
  Filled 2018-09-14: qty 1

## 2018-09-14 MED ORDER — INFLUENZA VAC SPLIT HIGH-DOSE 0.5 ML IM SUSY
0.5000 mL | PREFILLED_SYRINGE | INTRAMUSCULAR | Status: AC
  Administered 2018-09-15: 0.5 mL via INTRAMUSCULAR
  Filled 2018-09-14: qty 0.5

## 2018-09-14 MED ORDER — SODIUM CHLORIDE 0.9 % IV SOLN
1.0000 g | Freq: Once | INTRAVENOUS | Status: AC
  Administered 2018-09-14: 1 g via INTRAVENOUS
  Filled 2018-09-14: qty 10

## 2018-09-14 MED ORDER — SODIUM CHLORIDE 0.9 % IV SOLN
INTRAVENOUS | Status: AC
  Administered 2018-09-14: 18:00:00 via INTRAVENOUS

## 2018-09-14 MED ORDER — ACETAMINOPHEN 500 MG PO TABS: 500.0000 mg | ORAL_TABLET | Freq: Four times a day (QID) | ORAL | Status: DC | PRN

## 2018-09-14 MED ORDER — WARFARIN SODIUM 1 MG PO TABS
1.0000 mg | ORAL_TABLET | Freq: Once | ORAL | Status: AC
  Administered 2018-09-14: 1 mg via ORAL
  Filled 2018-09-14: qty 1

## 2018-09-14 MED ORDER — SODIUM CHLORIDE 0.9 % IV SOLN
500.0000 mg | Freq: Once | INTRAVENOUS | Status: AC
  Administered 2018-09-14: 500 mg via INTRAVENOUS
  Filled 2018-09-14: qty 500

## 2018-09-14 MED ORDER — SODIUM CHLORIDE 0.9 % IV SOLN
500.0000 mg | INTRAVENOUS | Status: DC
  Filled 2018-09-14: qty 500

## 2018-09-14 MED ORDER — SODIUM CHLORIDE 0.9 % IV SOLN
1.0000 g | INTRAVENOUS | Status: DC
  Filled 2018-09-14: qty 10

## 2018-09-14 NOTE — ED Triage Notes (Signed)
Pt via EMS. Sx began this morning, pt reports onset of "body shaking" and frequent urination. "unable to empty my bladder." EMS gave 1000 mg tylenol. Temp was 103.5, went down to 102.6.  Denies weakness. Hx of afib.

## 2018-09-14 NOTE — H&P (Signed)
HPI  [name] EYC:[id number] DOB: [birth year] DOA: 09/14/2018  PCP: Lorene Dy, MD   Chief Complaint: Cough, fever  HPI:  76 f colon cancer-tubular adenoma status post laparoscopic right colectomy for 9.12 and prior surgery 1996 under Dr. Rosana Hoes for the same issue hypothyroid HTN HLD MR AoV insufficiency peripheral neuropathy hemochromatosis LVH systolic function 14-97% severe RA E, PA peak 47 07/01/2018 prior microscopic hematuria  Endorse 2-day history cough, feeling poorly, weakness, poor appetite-found to have fever at home p.m. 9/14 Saw PCP in the past 2 weeks prior, told likely reflux was given OTC meds and then Rx for the same guaifenesin and omeprazole   ED Course: febrile 103--given tylenol --UC and BC performed-antibiotics initiated later with IV fluid  Review of Systems:  Has had some falls at home ambulates with a cane but is supposed to use a walker- + fever, - visual changes, + sore throat--- patient states that she does snore at times, rash, some muscle aches,  She has had mild cough related chest pain without radiation and other issue, SOB, dysuria, bleeding, n/v/abdominal pain.  Past Medical History:  Diagnosis Date  . Arthritis   . Arthritis pain   . Atrial fibrillation ([place])   . Breast cancer ([place]) 1991   right  . Cancer ([place])   . Constipation   . Environmental allergies   . Hemochromatosis 1998  . History of colon cancer   . HTN (hypertension)   . Hx of echocardiogram    Echo (8/15):  EF 45-50%, diff HK, mild AI, MAC, mild to mod MR, severe LAE, mod RAE, PASP 32 mmHg  . PONV (postoperative nausea and vomiting)   . Thyroid disease   . Varicose vein of leg     Past Surgical History:  Procedure Laterality Date  . BREAST LUMPECTOMY Right 1991  . BREAST SURGERY  1991   Rt br lump removed-benign  . COLON SURGERY  1996   CANCER REMOVAL  . GALLBLADDER SURGERY  2005  . LIVER BIOPSY  04/08/2012   Procedure: LIVER BIOPSY;  Surgeon: Adin Hector,  MD;  Location: [facility];  Service: General;;  laparoscopic  . TOTAL HIP ARTHROPLASTY  2004   RIGHT     reports that she has never smoked. She has never used smokeless tobacco. She reports that she does not drink alcohol or use drugs. Mobility: Ambulates with cane should be using a walker Lives with husband of 64 years of age independently Both daughters live behind her house Previously used to work with [employer] went through and completed high school Never smoker never drinker  Allergies  Allergen Reactions  . Tramadol Other (See Comments)    MENTAL CHANGES, VERY SNAPPY, MADE PATIENT FEEL WORSE AND PATIENT PREFERENCE NOT TO TAKE MED  . Adhesive [Tape] Rash  . Cardizem [Diltiazem Hcl] Other (See Comments) and Cough    Rash that caused dark spotting   . Latex Rash and Other (See Comments)    Per Patient Latex=lip swelling    Family History  Problem Relation Age of Onset  . Heart disease Mother   . Stroke Mother   . Heart disease Father   . Pneumonia Brother   . Hypertension Maternal Grandfather   . Heart attack Neg Hx      Prior to Admission medications   Medication Sig Start Date End Date Taking? Authorizing Provider  acetaminophen (TYLENOL) 500 MG tablet Take 500 mg by mouth every 6 (six) hours as needed for moderate  pain or headache.    Yes [provider]  atorvastatin (LIPITOR) 10 MG tablet Take 10 mg by mouth Daily.    Yes [provider]  furosemide (LASIX) 20 MG tablet Take 20 mg by mouth Daily. EXTRA 20 MG PRN FOR EDEMA PT STATES PER PCP 08/22/11  Yes [provider]  gabapentin (NEURONTIN) 100 MG capsule Take 100 mg by mouth at bedtime.  04/11/15  Yes [provider]  levothyroxine (SYNTHROID, LEVOTHROID) 50 MCG tablet Take 50 mcg by mouth Daily.    Yes [provider]  lisinopril (PRINIVIL,ZESTRIL) 2.5 MG tablet Take 2.5 mg by mouth daily. 08/11/18  Yes [provider]  losartan (COZAAR) 50 MG tablet Take 50 mg by  mouth daily.  08/15/16  Yes [provider]  meclizine (ANTIVERT) 25 MG tablet Take 25 mg by mouth 2 (two) times daily as needed for dizziness.    Yes [provider]  metoprolol succinate (TOPROL XL) 25 MG 24 hr tablet Take 1 tablet (25 mg total) by mouth daily. Patient taking differently: Take 25 mg by mouth at bedtime.  05/20/15  Yes Burnell Blanks, MD  omeprazole (PRILOSEC) 20 MG capsule Take 20 mg by mouth daily.   Yes [provider]  polyethylene glycol (MIRALAX / GLYCOLAX) packet Take 17 g by mouth daily as needed for moderate constipation.    Yes [provider]  senna (SENOKOT) 8.6 MG TABS Take 1 tablet by mouth 2 (two) times daily as needed for moderate constipation.    Yes [provider]  warfarin (COUMADIN) 3 MG tablet TAKE AS DIRECTED BY COUMADIN CLINIC Patient taking differently: Take 1.5 mg by mouth at bedtime.  07/18/18  Yes Burnell Blanks, MD  guaiFENesin (MUCINEX) 600 MG 12 hr tablet Take 1 tablet (600 mg total) by mouth 2 (two) times daily. Patient not taking: Reported on 09/14/2018 04/27/13   Monika Salk, MD  HYDROcodone-acetaminophen (NORCO) 5-325 MG tablet Take 1 tablet by mouth every 4 (four) hours as needed. Patient not taking: Reported on 09/14/2018 01/23/16   Daleen Bo, MD    Physical Exam:  Vitals:   09/14/18 1330 09/14/18 1528  BP: 114/76 121/82  Pulse: 100 94  Resp: (!) 22 17  Temp:  97.8 F (36.6 C)  SpO2: 94% 94%     Pleasant alert arcus senilis no icterus no pallor  Mallampatti 1, no JVD no bruit S1-S2 irregularly irregular no murmur  Chest clinically clear no rales no rhonchi no TVR no TVF trachea central  Abdomen slightly obese with large midline scar-no rebound no guarding  Lower extremities slightly swollen grade 1 lower extremity pitting edema  Neurologically intact moves 4 limbs equally without deficit  I have personally reviewed following labs and imaging studies  Labs:    BUN/creatinine 20/0.5 up from15/0.8, lactic acid 0.88,  Hemoglobin 13 platelet 142  Imaging studies:  IMPRESSION: RIGHT infrahilar pneumonia, most likely within the RIGHT middle  lobe.    Medical tests:   EKG independently reviewed: Rate controlled A. fib rate about 80, QRS axis is left anterior fascicular block but -50 no ischemic injury  Test discussed with performing physician:  n  Decision to obtain old records:   n   Review and summation of old records:   n   Active Problems:   * No active hospital problems. *   Assessment/Plan Probable community-acquired pneumonia however could be aspiration-we will start with Coverage and narrow rapidly to probable Unasyn or Augmentin  if she looks this good tomorrow-she is not febrile at this time and is not requiring oxygen-because she has some chest pain see below Speech eval not emergently in a.m. because of location of right middle lobe pneumonia Continue Mucinex 600 twice daily We will give saline at 75 cc/h for 12 hours and then stop and can be reevaluated in the morning as she has heart failure  GERd ddx angina Has been describing reflux-like pain for the past 2 weeks-no strong prior history of cardiac issues-EKG and seems benign and would not treat as ACS at this stage  Atrial fibrillation chads score >3-on Coumadin-continue the same as per pharmacy-INR in the morning Would continue toprol XL 25 daily  HTN is on dual ACE inhibitor and ARB-discontinue ARB 50 daily given slight azotemia-monitor  Heart failure-holding Lasix 20 right now for lower extremity swelling may benefit from TED hose  Peripheral neuropathy not otherwise specified continue gabapentin 100 at bedtime  Constipation continue MiraLAX and senna  Dizziness continue meclizine 25 twice daily   Severity of Illness: The appropriate patient status for this patient is INPATIENT. Inpatient status is judged to be reasonable and necessary in order to  provide the required intensity of service to ensure the patient's safety. The patient's presenting symptoms, physical exam findings, and initial radiographic and laboratory data in the context of their chronic comorbidities is felt to place them at high risk for further clinical deterioration. Furthermore, it is not anticipated that the patient will be medically stable for discharge from the hospital within 2 midnights of admission. The following factors support the patient status of inpatient.   " The patient's presenting symptoms include cough and high fever. " The worrisome physical exam findings include an 80. " The initial radiographic and laboratory data are worrisome because of pneumonia. " The chronic co-morbidities include A. fib prior colon cancer.   * I certify that at the point of admission it is my clinical judgment that the patient will require inpatient hospital care spanning beyond 2 midnights from the point of admission due to high intensity of service, high risk for further deterioration and high frequency of surveillance required.*     Therapeutic on Coumadin, full code, inpatient, none  Time spent: 72 minutes  Serra Younan, MD  [facility] Direct contact: [phone] --[place]  --[email or web address]; password [email or web address]  7PM-7AM contact night coverage as above  09/14/2018, 3:35 PM

## 2018-09-14 NOTE — ED Notes (Signed)
Pt catheterized with approximately 150 mL dark urine return.

## 2018-09-14 NOTE — ED Notes (Signed)
Patient transported to X-ray 

## 2018-09-14 NOTE — Progress Notes (Signed)
[facility] for warfarin Indication: atrial fibrillation  Allergies  Allergen Reactions  . Tramadol Other (See Comments)    MENTAL CHANGES, VERY SNAPPY, MADE PATIENT FEEL WORSE AND PATIENT PREFERENCE NOT TO TAKE MED  . Adhesive [Tape] Rash  . Cardizem [Diltiazem Hcl] Other (See Comments) and Cough    Rash that caused dark spotting   . Latex Rash and Other (See Comments)    Per Patient Latex=lip swelling    Patient Measurements: Height: _0  (160 cm) Weight: 170 lb 9.6 oz (77.4 kg) IBW/kg (Calculated) : 52.4  Vital Signs: Temp: 97.6 F (36.4 C) (09/15 1737) Temp Source: Oral (09/15 1737) BP: 107/70 (09/15 1737) Pulse Rate: 89 (09/15 1737)  Labs: Recent Labs    09/14/18 1355 09/14/18 1636  HGB 13.6  --   HCT 42.1  --   PLT 142*  --   LABPROT  --  26.2*  INR  --  2.43  CREATININE 0.55  --     Estimated Creatinine Clearance: 49.7 mL/min (by C-G formula based on SCr of 0.55 mg/dL).   Medical History: Past Medical History:  Diagnosis Date  . Arthritis   . Arthritis pain   . Atrial fibrillation ([place])   . Breast cancer ([place]) 1991   right  . Cancer ([place])   . Constipation   . Environmental allergies   . Hemochromatosis 1998  . History of colon cancer   . HTN (hypertension)   . Hx of echocardiogram    Echo (8/15):  EF 45-50%, diff HK, mild AI, MAC, mild to mod MR, severe LAE, mod RAE, PASP 32 mmHg  . PONV (postoperative nausea and vomiting)   . Thyroid disease   . Varicose vein of leg     Medications:  Medications Prior to Admission  Medication Sig Dispense Refill Last Dose  . acetaminophen (TYLENOL) 500 MG tablet Take 500 mg by mouth every 6 (six) hours as needed for moderate pain or headache.    PRN  . atorvastatin (LIPITOR) 10 MG tablet Take 10 mg by mouth Daily.    09/14/2018 at Unknown time  . furosemide (LASIX) 20 MG tablet Take 20 mg by mouth Daily. EXTRA 20 MG PRN FOR EDEMA PT STATES PER PCP   09/14/2018 at Unknown  time  . gabapentin (NEURONTIN) 100 MG capsule Take 100 mg by mouth at bedtime.    09/13/2018 at Unknown time  . levothyroxine (SYNTHROID, LEVOTHROID) 50 MCG tablet Take 50 mcg by mouth Daily.    09/14/2018 at Unknown time  . lisinopril (PRINIVIL,ZESTRIL) 2.5 MG tablet Take 2.5 mg by mouth daily.  2 09/14/2018 at Unknown time  . losartan (COZAAR) 50 MG tablet Take 50 mg by mouth daily.   QD 09/14/2018 at Unknown time  . meclizine (ANTIVERT) 25 MG tablet Take 25 mg by mouth 2 (two) times daily as needed for dizziness.    PRN  . metoprolol succinate (TOPROL XL) 25 MG 24 hr tablet Take 1 tablet (25 mg total) by mouth daily. (Patient taking differently: Take 25 mg by mouth at bedtime. ) 90 tablet 3 09/13/2018 at 2200  . omeprazole (PRILOSEC) 20 MG capsule Take 20 mg by mouth daily.   09/14/2018 at Unknown time  . polyethylene glycol (MIRALAX / GLYCOLAX) packet Take 17 g by mouth daily as needed for moderate constipation.    PRN  . senna (SENOKOT) 8.6 MG TABS Take 1 tablet by mouth 2 (two) times daily as needed for moderate constipation.  PRN  . warfarin (COUMADIN) 3 MG tablet TAKE AS DIRECTED BY COUMADIN CLINIC (Patient taking differently: Take 1.5 mg by mouth at bedtime. ) 80 tablet 0 09/13/2018 at 2200  . guaiFENesin (MUCINEX) 600 MG 12 hr tablet Take 1 tablet (600 mg total) by mouth 2 (two) times daily. (Patient not taking: Reported on 09/14/2018) 14 tablet 0 Not Taking at Unknown time  . HYDROcodone-acetaminophen (NORCO) 5-325 MG tablet Take 1 tablet by mouth every 4 (four) hours as needed. (Patient not taking: Reported on 09/14/2018) 20 tablet 0 Not Taking at Unknown time   Scheduled:  . gabapentin  100 mg Oral QHS  . guaiFENesin  600 mg Oral BID  . [START ON 09/15/2018] Influenza vac split quadrivalent PF  0.5 mL Intramuscular Tomorrow-1000  . [START ON 09/15/2018] levothyroxine  50 mcg Oral QAC breakfast  . [START ON 09/15/2018] lisinopril  2.5 mg Oral Daily  . metoprolol succinate  25 mg Oral QHS  .  [START ON 09/15/2018] pantoprazole  40 mg Oral Daily  . warfarin  1 mg Oral ONCE-1800  . Warfarin - Pharmacist Dosing Inpatient   Does not apply q1800   PRN: acetaminophen, meclizine, polyethylene glycol, senna  Assessment: 70 yoF with PMH colon cancer, hypothyroid, HTN, HLD, Afib on warfarin, hemochromatosis, aortic valve insufficiency, admitted 9/15 for CAP. Pharmacy to dose coumadin while admitted.   Baseline INR 2.43  Prior anticoagulation: warfarin 1.5 mg daily  Significant events:  Today, 09/14/2018:  CBC: wnl, Plt borderline low  INR therapeutic  Major drug interactions: none, but on broad spectrum abx  No bleeding issues per nursing  Diet ordered  Goal of Therapy: INR 2-3  Plan:  Warfarin 1 mg PO tonight at 18:00; will dose conservatively given acute illness, abx, etc.  Daily INR  CBC at least q72 hr while on warfarin  Monitor for signs of bleeding or thrombosis   Reuel Boom, PharmD, BCPS [phone] 09/14/2018, 6:31 PM

## 2018-09-14 NOTE — ED Provider Notes (Signed)
[facility] DEPT Provider Note   CSN: [id number] Arrival date & time: 09/14/18  1209     History   Chief Complaint Chief Complaint  Patient presents with  . Fever    HPI [name] is a 84 y.o. female presenting for fever, chills and rigors that began approximately 9 AM this morning.  Patient states that she was at a family function when symptoms began.  EMS was called by family members and 1 g of Tylenol was given per EMS.  Upon EMS arrival patient's temperature was measured at 103.5 and has decreased to 98.8 on arrival to ED.  Patient endorses resolution of symptoms with Tylenol.  Additionally with onset of chills, rigors and fever patient had nausea without vomiting.  Patient having denies pain at that time and also denies any and all pain at time of my evaluation.  Patient endorses increased urinary frequency and feeling of urinary retention.  Also with history of recent occasional nonproductive cough. Denies sick contacts or SOB. Patient states that cough began approximately one week ago. Patient denies hemoptysis, chest pain, or new leg swelling/color change.  HPI  Past Medical History:  Diagnosis Date  . Arthritis   . Arthritis pain   . Atrial fibrillation ([place])   . Breast cancer ([place]) 1991   right  . Cancer ([place])   . Constipation   . Environmental allergies   . Hemochromatosis 1998  . History of colon cancer   . HTN (hypertension)   . Hx of echocardiogram    Echo (8/15):  EF 45-50%, diff HK, mild AI, MAC, mild to mod MR, severe LAE, mod RAE, PASP 32 mmHg  . PONV (postoperative nausea and vomiting)   . Thyroid disease   . Varicose vein of leg     Patient Active Problem List   Diagnosis Date Noted  . PNA (pneumonia) 09/14/2018  . Mitral regurgitation 07/13/2014  . Encounter for therapeutic drug monitoring 03/15/2014  . Acute bronchitis 04/24/2013  . Reactive airway disease 04/24/2013  . Pre-operative cardiovascular examination  03/03/2012  . Benign neoplasm of ascending colon 02/21/2012  . Long term (current) use of anticoagulants 09/20/2011  . Atrial fibrillation ([place]) 09/13/2011    Past Surgical History:  Procedure Laterality Date  . BREAST LUMPECTOMY Right 1991  . BREAST SURGERY  1991   Rt br lump removed-benign  . COLON SURGERY  1996   CANCER REMOVAL  . GALLBLADDER SURGERY  2005  . LIVER BIOPSY  04/08/2012   Procedure: LIVER BIOPSY;  Surgeon: Adin Hector, MD;  Location: [facility];  Service: General;;  laparoscopic  . TOTAL HIP ARTHROPLASTY  2004   RIGHT     OB History   None      Home Medications    Prior to Admission medications   Medication Sig Start Date End Date Taking? Authorizing Provider  acetaminophen (TYLENOL) 500 MG tablet Take 500 mg by mouth every 6 (six) hours as needed for moderate pain or headache.    Yes [provider]  atorvastatin (LIPITOR) 10 MG tablet Take 10 mg by mouth Daily.    Yes [provider]  furosemide (LASIX) 20 MG tablet Take 20 mg by mouth Daily. EXTRA 20 MG PRN FOR EDEMA PT STATES PER PCP 08/22/11  Yes [provider]  gabapentin (NEURONTIN) 100 MG capsule Take 100 mg by mouth at bedtime.  04/11/15  Yes [provider]  levothyroxine (SYNTHROID, LEVOTHROID) 50 MCG tablet Take 50 mcg by mouth  Daily.    Yes [provider]  lisinopril (PRINIVIL,ZESTRIL) 2.5 MG tablet Take 2.5 mg by mouth daily. 08/11/18  Yes [provider]  losartan (COZAAR) 50 MG tablet Take 50 mg by mouth daily.  08/15/16  Yes [provider]  meclizine (ANTIVERT) 25 MG tablet Take 25 mg by mouth 2 (two) times daily as needed for dizziness.    Yes [provider]  metoprolol succinate (TOPROL XL) 25 MG 24 hr tablet Take 1 tablet (25 mg total) by mouth daily. Patient taking differently: Take 25 mg by mouth at bedtime.  05/20/15  Yes Burnell Blanks, MD  omeprazole (PRILOSEC) 20 MG capsule Take 20 mg by mouth daily.   Yes  [provider]  polyethylene glycol (MIRALAX / GLYCOLAX) packet Take 17 g by mouth daily as needed for moderate constipation.    Yes [provider]  senna (SENOKOT) 8.6 MG TABS Take 1 tablet by mouth 2 (two) times daily as needed for moderate constipation.    Yes [provider]  warfarin (COUMADIN) 3 MG tablet TAKE AS DIRECTED BY COUMADIN CLINIC Patient taking differently: Take 1.5 mg by mouth at bedtime.  07/18/18  Yes Burnell Blanks, MD  guaiFENesin (MUCINEX) 600 MG 12 hr tablet Take 1 tablet (600 mg total) by mouth 2 (two) times daily. Patient not taking: Reported on 09/14/2018 04/27/13   Monika Salk, MD  HYDROcodone-acetaminophen (NORCO) 5-325 MG tablet Take 1 tablet by mouth every 4 (four) hours as needed. Patient not taking: Reported on 09/14/2018 01/23/16   Daleen Bo, MD    Family History Family History  Problem Relation Age of Onset  . Heart disease Mother   . Stroke Mother   . Heart disease Father   . Pneumonia Brother   . Hypertension Maternal Grandfather   . Heart attack Neg Hx     Social History Social History   Tobacco Use  . Smoking status: Never Smoker  . Smokeless tobacco: Never Used  Substance Use Topics  . Alcohol use: No  . Drug use: No     Allergies   Tramadol; Adhesive [tape]; Cardizem [diltiazem hcl]; and Latex   Review of Systems Review of Systems  Constitutional: Positive for chills and fever.  HENT: Negative for congestion, rhinorrhea and sore throat.   Respiratory: Positive for cough. Negative for shortness of breath.   Cardiovascular: Negative.  Negative for chest pain and leg swelling.  Gastrointestinal: Positive for nausea. Negative for abdominal pain, diarrhea and vomiting.  Genitourinary: Positive for difficulty urinating and frequency. Negative for dysuria and flank pain.  Skin: Negative.  Negative for color change, rash and wound.  Neurological: Positive for weakness. Negative for dizziness,  syncope and headaches.  All other systems reviewed and are negative.  Physical Exam Updated Vital Signs BP 121/82 (BP Location: Left Arm)   Pulse 94   Temp 97.8 F (36.6 C) (Oral)   Resp 17   SpO2 94%   Physical Exam  Constitutional: She is oriented to person, place, and time. She appears well-developed and well-nourished. No distress.  HENT:  Head: Normocephalic and atraumatic.  Right Ear: External ear normal.  Left Ear: External ear normal.  Nose: Nose normal.  Eyes: Pupils are equal, round, and reactive to light. EOM are normal.  Neck: Trachea normal, normal range of motion, full passive range of motion without pain and phonation normal. Neck supple. No tracheal deviation present.  Cardiovascular: Normal rate and intact distal pulses. An irregularly irregular rhythm  present.  Pulses:      Dorsalis pedis pulses are 2+ on the right side, and 2+ on the left side.       Posterior tibial pulses are 2+ on the right side, and 2+ on the left side.  Pulmonary/Chest: Effort normal and breath sounds normal. No respiratory distress. She has no decreased breath sounds. She has no rhonchi. She exhibits no tenderness, no crepitus, no edema, no deformity and no swelling.  Abdominal: Soft. Bowel sounds are normal. There is no tenderness. There is no rigidity, no rebound, no guarding, no tenderness at McBurney's point and negative Murphy's sign.  Musculoskeletal: Normal range of motion.       Right lower leg: She exhibits edema.       Left lower leg: She exhibits edema.  Bilateral lower extremity edema, symmetrical.  Patient and family states that legs appear normal at this time, takes Lasix.  Feet:  Right Foot:  Protective Sensation: 3 sites tested. 3 sites sensed.  Left Foot:  Protective Sensation: 3 sites tested. 3 sites sensed.  Neurological: She is alert and oriented to person, place, and time. No cranial nerve deficit or sensory deficit.  Skin: Skin is warm and dry.  Psychiatric: She has  a normal mood and affect. Her behavior is normal.    ED Treatments / Results  Labs (all labs ordered are listed, but only abnormal results are displayed) Labs Reviewed  URINALYSIS, ROUTINE W REFLEX MICROSCOPIC - Abnormal; Notable for the following components:      Result Value   Hgb urine dipstick MODERATE (*)    Ketones, ur 5 (*)    All other components within normal limits  CBC WITH DIFFERENTIAL/PLATELET - Abnormal; Notable for the following components:   Platelets 142 (*)    All other components within normal limits  BASIC METABOLIC PANEL - Abnormal; Notable for the following components:   Glucose, Bld 100 (*)    All other components within normal limits  URINE CULTURE  CULTURE, BLOOD (ROUTINE X 2)  CULTURE, BLOOD (ROUTINE X 2)  PROTIME-INR  I-STAT CG4 LACTIC ACID, ED  I-STAT CG4 LACTIC ACID, ED    EKG None  Radiology Dg Chest 2 View  Result Date: 09/14/2018 CLINICAL DATA:  Fever today.  Recent history of acid reflux.  Cough. EXAM: CHEST - 2 VIEW COMPARISON:  Chest x-rays dated 04/24/2013 and 04/07/2013. FINDINGS: Ill-defined opacity within the RIGHT lower lung, infrahilar, suggesting pneumonia. No pleural effusion or pneumothorax seen. Stable cardiomegaly. IMPRESSION: RIGHT infrahilar pneumonia, most likely within the RIGHT middle lobe. Electronically Signed   By: Franki Cabot M.D.   On: 09/14/2018 14:47    Procedures Procedures (including critical care time)  Medications Ordered in ED Medications  cefTRIAXone (ROCEPHIN) 1 g in sodium chloride 0.9 % 100 mL IVPB (1 g Intravenous New Bag/Given 09/14/18 1616)  azithromycin (ZITHROMAX) 500 mg in sodium chloride 0.9 % 250 mL IVPB (has no administration in time range)  sodium chloride 0.9 % bolus 1,000 mL (1,000 mLs Intravenous New Bag/Given 09/14/18 1616)  Warfarin - Pharmacist Dosing Inpatient (has no administration in time range)     Initial Impression / Assessment and Plan / ED Course  I have reviewed the triage vital  signs and the nursing notes.  Pertinent labs & imaging results that were available during my care of the patient were reviewed by me and considered in my medical decision making (see chart for details).  Clinical Course as of Sep 14 1640  Sun Sep 14, 2018  1544 Port Score of 91: 86 points for age, 15 points for fever, -10 for female.   [BM]    Clinical Course User Index [BM] Deliah Boston, PA-C   Patient planning after fever that began this morning.  Fever accompanied with chills, rigors.  Initial thought was patient may be experiencing a urinary tract infection due to increased urinary frequency over the past few days.  No urinary concern on bladder scan today and is regular 150 cc of urine output after catheterization.  Lactic acid and blood cultures were drawn considering patient's history of chills and rigors along with fever.  Urinalysis did not show bacteria, nitrates or leukocytes today. Patient also with history of non-productive cough for the past few days.  Chest x-ray today shows right middle lobe pneumonia.  Patient does not meet SIRS criteria. CBC nonacute BMP nonacute UA nonacute Lactic acid within normal limits Blood cultures pending Vital signs within normal limits post acetaminophen. Chest x-ray with right middle lobe pneumonia  Fluids given, IV antibiotics started.  Patient and family updated on plan of care and are agreeable to admission at this time.  PORT score of 91 places patient in class risk for recommends hospitalization.  Consult called to Dr. Verlon Au who has seen patient in emergency department and has admitted to his service for further treatment of community acquired pneumonia.  Patient's case discussed with Dr. Tamera Punt who agrees with work-up and admission.    Note: Portions of this report may have been transcribed using voice recognition software. Every effort was made to ensure accuracy; however, inadvertent computerized transcription errors  may still be present.  Final Clinical Impressions(s) / ED Diagnoses   Final diagnoses:  Community acquired pneumonia of right middle lobe of lung [facility])    ED Discharge Orders    None       Gari Crown 09/14/18 1648    Malvin Johns, MD 09/19/18 1506

## 2018-09-14 NOTE — ED Notes (Signed)
Report given to Pam, 4E.

## 2018-09-14 NOTE — ED Notes (Signed)
ED TO INPATIENT HANDOFF REPORT  Name/Age/Gender [name] 84 y.o. female  Code Status Code Status History    Date Active Date Inactive Code Status Order ID Comments User Context   04/08/2012 1532 04/14/2012 1246 Full Code [id number]  Ronnette Hila, RN Inpatient    Advance Directive Documentation     Most Recent Value  Type of Advance Directive  Healthcare Power of Attorney  Pre-existing out of facility DNR order (yellow form or pink MOST form)  -  "MOST" Form in Place?  -      Home/SNF/Other Home  Chief Complaint weakness  Level of Care/Admitting Diagnosis ED Disposition    ED Disposition Condition Walker: [facility] [100102]  Level of Care: Telemetry [5]  Admit to tele based on following criteria: Eval of Syncope  Diagnosis: PNA (pneumonia) [235361]  Admitting Physician: Nita Sells [phone]  Attending Physician: Nita Sells [phone]  Estimated length of stay: past midnight tomorrow  Certification:: I certify this patient will need inpatient services for at least 2 midnights  PT Class (Do Not Modify): Inpatient [101]  PT Acc Code (Do Not Modify): Private [1]       Medical History Past Medical History:  Diagnosis Date  . Arthritis   . Arthritis pain   . Atrial fibrillation ([place])   . Breast cancer ([place]) 1991   right  . Cancer ([place])   . Constipation   . Environmental allergies   . Hemochromatosis 1998  . History of colon cancer   . HTN (hypertension)   . Hx of echocardiogram    Echo (8/15):  EF 45-50%, diff HK, mild AI, MAC, mild to mod MR, severe LAE, mod RAE, PASP 32 mmHg  . PONV (postoperative nausea and vomiting)   . Thyroid disease   . Varicose vein of leg     Allergies Allergies  Allergen Reactions  . Tramadol Other (See Comments)    MENTAL CHANGES, VERY SNAPPY, MADE PATIENT FEEL WORSE AND PATIENT PREFERENCE NOT TO TAKE MED  . Adhesive [Tape] Rash  . Cardizem [Diltiazem Hcl]  Other (See Comments) and Cough    Rash that caused dark spotting   . Latex Rash and Other (See Comments)    Per Patient Latex=lip swelling    IV Location/Drains/Wounds Patient Lines/Drains/Airways Status   Active Line/Drains/Airways    Name:   Placement date:   Placement time:   Site:   Days:   Peripheral IV 09/14/18 Left;Posterior Forearm   09/14/18    1515    Forearm   less than 1          Labs/Imaging Results for orders placed or performed during the hospital encounter of 09/14/18 (from the past 48 hour(s))  CBC with Differential     Status: Abnormal   Collection Time: 09/14/18  1:55 PM  Result Value Ref Range   WBC 9.5 4.0 - 10.5 K/uL   RBC 4.22 3.87 - 5.11 MIL/uL   Hemoglobin 13.6 12.0 - 15.0 g/dL   HCT 42.1 36.0 - 46.0 %   MCV 99.8 78.0 - 100.0 fL   MCH 32.2 26.0 - 34.0 pg   MCHC 32.3 30.0 - 36.0 g/dL   RDW 12.7 11.5 - 15.5 %   Platelets 142 (L) 150 - 400 K/uL   Neutrophils Relative % 76 %   Neutro Abs 7.2 1.7 - 7.7 K/uL   Lymphocytes Relative 16 %   Lymphs Abs 1.5 0.7 - 4.0 K/uL  Monocytes Relative 8 %   Monocytes Absolute 0.8 0.1 - 1.0 K/uL   Eosinophils Relative 0 %   Eosinophils Absolute 0.0 0.0 - 0.7 K/uL   Basophils Relative 0 %   Basophils Absolute 0.0 0.0 - 0.1 K/uL    Comment: Performed at [facility], [place] [address], [place], [place] [address]  Basic metabolic panel     Status: Abnormal   Collection Time: 09/14/18  1:55 PM  Result Value Ref Range   Sodium 143 135 - 145 mmol/L   Potassium 3.6 3.5 - 5.1 mmol/L   Chloride 108 98 - 111 mmol/L   CO2 28 22 - 32 mmol/L   Glucose, Bld 100 (H) 70 - 99 mg/dL   BUN 20 8 - 23 mg/dL   Creatinine, Ser 0.55 0.44 - 1.00 mg/dL   Calcium 9.7 8.9 - 10.3 mg/dL   GFR calc non Af Amer >60 >60 mL/min   GFR calc Af Amer >60 >60 mL/min    Comment: (NOTE) The eGFR has been calculated using the CKD EPI equation. This calculation has not been validated in all clinical situations. eGFR's persistently  <60 mL/min signify possible Chronic Kidney Disease.    Anion gap 7 5 - 15    Comment: Performed at [facility], [place] [address], [place], [place] [address]  Urinalysis, Routine w reflex microscopic- may I&O cath if menses     Status: Abnormal   Collection Time: 09/14/18  2:15 PM  Result Value Ref Range   Color, Urine YELLOW YELLOW   APPearance CLEAR CLEAR   Specific Gravity, Urine 1.019 1.005 - 1.030   pH 5.0 5.0 - 8.0   Glucose, UA NEGATIVE NEGATIVE mg/dL   Hgb urine dipstick MODERATE (A) NEGATIVE   Bilirubin Urine NEGATIVE NEGATIVE   Ketones, ur 5 (A) NEGATIVE mg/dL   Protein, ur NEGATIVE NEGATIVE mg/dL   Nitrite NEGATIVE NEGATIVE   Leukocytes, UA NEGATIVE NEGATIVE   RBC / HPF 6-10 0 - 5 RBC/hpf   WBC, UA 0-5 0 - 5 WBC/hpf   Bacteria, UA NONE SEEN NONE SEEN   Mucus PRESENT     Comment: Performed at [facility], [place] [address] [place], [place] [address]  I-Stat CG4 Lactic Acid, ED     Status: None   Collection Time: 09/14/18  2:31 PM  Result Value Ref Range   Lactic Acid, Venous 0.88 0.5 - 1.9 mmol/L   Dg Chest 2 View  Result Date: 09/14/2018 CLINICAL DATA:  Fever today.  Recent history of acid reflux.  Cough. EXAM: CHEST - 2 VIEW COMPARISON:  Chest x-rays dated 04/24/2013 and 04/07/2013. FINDINGS: Ill-defined opacity within the RIGHT lower lung, infrahilar, suggesting pneumonia. No pleural effusion or pneumothorax seen. Stable cardiomegaly. IMPRESSION: RIGHT infrahilar pneumonia, most likely within the RIGHT middle lobe. Electronically Signed   By: Franki Cabot M.D.   On: 09/14/2018 14:47    Pending Labs Unresulted Labs (From admission, onward)    Start     Ordered   09/15/18 0500  Protime-INR  Daily,   R     09/14/18 1632   09/14/18 1636  Protime-INR  STAT,   R     09/14/18 1636   09/14/18 1414  Blood culture (routine x 2)  BLOOD CULTURE X 2,   STAT     09/14/18 1413   09/14/18 1337  Urine culture  STAT,   STAT     09/14/18 1336    Signed and Held  Culture, blood (routine x 2) Call MD if unable to obtain prior to antibiotics being given  BLOOD CULTURE X 2,   R    Comments:  If blood cultures drawn in Emergency Department - Do not draw and cancel order    Signed and Held   Signed and Held  Culture, sputum-assessment  STAT,   R     Signed and Held   Signed and Held  Gram stain  Once,   R     Signed and Held   Signed and Held  Strep pneumoniae urinary antigen  Once,   R     Signed and Held   Signed and Held  Legionella Pneumophila Serogp 1 Ur Ag  Once,   R     Signed and Held   Signed and Held  Comprehensive metabolic panel  Tomorrow morning,   R     Signed and Held   Signed and Held  CBC WITH DIFFERENTIAL  Tomorrow morning,   R     Signed and Held          Vitals/Pain Today's Vitals   09/14/18 1600 09/14/18 1615 09/14/18 1630 09/14/18 1704  BP: 133/79  135/89 134/64  Pulse: (!) 105 88 90 80  Resp: _0 Temp:      TempSrc:      SpO2: 95% 96% 97% 97%  PainSc:        Isolation Precautions No active isolations  Medications Medications  azithromycin (ZITHROMAX) 500 mg in sodium chloride 0.9 % 250 mL IVPB (500 mg Intravenous New Bag/Given 09/14/18 1657)  sodium chloride 0.9 % bolus 1,000 mL (1,000 mLs Intravenous New Bag/Given 09/14/18 1616)  Warfarin - Pharmacist Dosing Inpatient (has no administration in time range)  cefTRIAXone (ROCEPHIN) 1 g in sodium chloride 0.9 % 100 mL IVPB (0 g Intravenous Stopped 09/14/18 1655)    Mobility walks with device

## 2018-09-15 ENCOUNTER — Encounter (HOSPITAL_COMMUNITY): Payer: Self-pay

## 2018-09-15 DIAGNOSIS — J181 Lobar pneumonia, unspecified organism: Secondary | ICD-10-CM | POA: Diagnosis not present

## 2018-09-15 DIAGNOSIS — J189 Pneumonia, unspecified organism: Secondary | ICD-10-CM

## 2018-09-15 LAB — CBC WITH DIFFERENTIAL/PLATELET
BASOS PCT: 0 %
Basophils Absolute: 0 10*3/uL (ref 0.0–0.1)
EOS ABS: 0 10*3/uL (ref 0.0–0.7)
Eosinophils Relative: 0 %
HCT: 39.1 % (ref 36.0–46.0)
Hemoglobin: 12.7 g/dL (ref 12.0–15.0)
LYMPHS ABS: 2.7 10*3/uL (ref 0.7–4.0)
Lymphocytes Relative: 31 %
MCH: 33.1 pg (ref 26.0–34.0)
MCHC: 32.5 g/dL (ref 30.0–36.0)
MCV: 101.8 fL — ABNORMAL HIGH (ref 78.0–100.0)
Monocytes Absolute: 0.7 10*3/uL (ref 0.1–1.0)
Monocytes Relative: 8 %
NEUTROS PCT: 61 %
Neutro Abs: 5.3 10*3/uL (ref 1.7–7.7)
PLATELETS: 131 10*3/uL — AB (ref 150–400)
RBC: 3.84 MIL/uL — AB (ref 3.87–5.11)
RDW: 12.9 % (ref 11.5–15.5)
WBC: 8.7 10*3/uL (ref 4.0–10.5)

## 2018-09-15 LAB — URINE CULTURE: Culture: NO GROWTH

## 2018-09-15 LAB — COMPREHENSIVE METABOLIC PANEL
ALBUMIN: 3.1 g/dL — AB (ref 3.5–5.0)
ALT: 13 U/L (ref 0–44)
AST: 18 U/L (ref 15–41)
Alkaline Phosphatase: 64 U/L (ref 38–126)
Anion gap: 9 (ref 5–15)
BUN: 17 mg/dL (ref 8–23)
CALCIUM: 9 mg/dL (ref 8.9–10.3)
CHLORIDE: 108 mmol/L (ref 98–111)
CO2: 25 mmol/L (ref 22–32)
CREATININE: 0.54 mg/dL (ref 0.44–1.00)
GFR calc non Af Amer: >60 mL/min (ref >60)
Glucose, Bld: 79 mg/dL (ref 70–99)
Potassium: 3.5 mmol/L (ref 3.5–5.1)
SODIUM: 142 mmol/L (ref 135–145)
Total Bilirubin: 3.4 mg/dL — ABNORMAL HIGH (ref 0.3–1.2)
Total Protein: 5.5 g/dL — ABNORMAL LOW (ref 6.5–8.1)

## 2018-09-15 LAB — PROTIME-INR
INR: 2.73
PROTHROMBIN TIME: 28.7 s — AB (ref 11.4–15.2)

## 2018-09-15 MED ORDER — WARFARIN SODIUM 1 MG PO TABS: 1.5000 mg | ORAL_TABLET | Freq: Every day | ORAL | Status: DC

## 2018-09-15 MED ORDER — POLYETHYLENE GLYCOL 3350 17 G PO PACK: 17.0000 g | PACK | Freq: Every day | ORAL | Status: DC | PRN

## 2018-09-15 MED ORDER — AZITHROMYCIN 250 MG PO TABS: ORAL_TABLET | ORAL | 0 refills | Status: DC

## 2018-09-15 MED ORDER — AZITHROMYCIN 250 MG PO TABS: ORAL_TABLET | ORAL | 0 refills | Status: AC

## 2018-09-15 MED ORDER — AZITHROMYCIN 250 MG PO TABS: 500.0000 mg | ORAL_TABLET | Freq: Every day | ORAL | Status: DC

## 2018-09-15 NOTE — Evaluation (Signed)
Physical Therapy Evaluation Patient Details Name: [name] MRN: [id number] DOB: December 25, 1929 Today's Date: 09/15/2018   History of Present Illness  56 f colon cancer-tubular adenoma status post laparoscopic right colectomy for 9.12, hypothyroid, HTN, HLD, MR AoV insufficiency peripheral neuropathy hemochromatosis; admitted with feverm pna  Clinical Impression  Pt admitted with above diagnosis. Pt currently with functional limitations due to the deficits listed below (see PT Problem List). Pt would benefit from HHPT vs OPPT but is reluctant d/t her husband having dementia and not liking people in the house, if she goes to [place] someone has to stay with her husabnd; discussed use of shower chair (in storage) at home and getting hand  Held shower head and dtr verbalizes understanding; Pt will benefit from skilled PT to increase their independence and safety with mobility to allow discharge to the venue listed below.       Follow Up Recommendations Home health PT(needs Hhpt VS OPPT, declines d/t other issues)    Equipment Recommendations  None recommended by PT    Recommendations for Other Services       Precautions / Restrictions Precautions Precautions: Fall      Mobility  Bed Mobility Overal bed mobility: Modified Independent             General bed mobility comments: incr time  Transfers Overall transfer level: Needs assistance Equipment used: Rolling walker (2 wheeled) Transfers: Sit to/from Stand Sit to Stand: Min guard         General transfer comment: cues for hand placement and to control descent  Ambulation/Gait Ambulation/Gait assistance: Min guard Gait Distance (Feet): 100 Feet Assistive device: Straight cane Gait Pattern/deviations: Step-through pattern;Decreased stride length;Wide base of support     General Gait Details: cues for trunk extension, step length; pt attempts to furniture walk but declines RW (also states she can't get around with it in  her house)  Stairs            Wheelchair Mobility    Modified Rankin (Stroke Patients Only)       Balance Overall balance assessment: Needs assistance;History of Falls(at least 2 recent falls)   Sitting balance-Leahy Scale: Fair       Standing balance-Leahy Scale: Poor Standing balance comment: reliant on UEs             High level balance activites: Turns;Side stepping High Level Balance Comments: min/guard, pt attempts to hold onto bed, chair, walls etc d/t decr balance             Pertinent Vitals/Pain Pain Assessment: No/denies pain    Home Living Family/patient expects to be discharged to:: Private residence Living Arrangements: Spouse/significant other(husband has Alzheimer's) Available Help at Discharge: Family Type of Home: House Home Access: Stairs to enter   Technical brewer of Steps: 3 Home Layout: One level Home Equipment: Environmental consultant - 2 wheels;Shower seat;Cane - single point      Prior Function Level of Independence: Independent with assistive device(s)         Comments: pt reports independence with ADLS     Hand Dominance        Extremity/Trunk Assessment   Upper Extremity Assessment Upper Extremity Assessment: Generalized weakness    Lower Extremity Assessment Lower Extremity Assessment: Generalized weakness       Communication   Communication: No difficulties  Cognition Arousal/Alertness: Awake/alert Behavior During Therapy: WFL for tasks assessed/performed Overall Cognitive Status: Within Functional Limits for tasks assessed  General Comments      Exercises     Assessment/Plan    PT Assessment Patient needs continued PT services  PT Problem List Decreased strength;Decreased mobility;Decreased activity tolerance;Decreased balance       PT Treatment Interventions DME instruction;Gait training;Functional mobility training;Therapeutic  activities;Therapeutic exercise;Patient/family education;Balance training    PT Goals (Current goals can be found in the Care Plan section)  Acute Rehab PT Goals PT Goal Formulation: With patient Time For Goal Achievement: 09/29/18 Potential to Achieve Goals: Good    Frequency Min 3X/week   Barriers to discharge        Co-evaluation               AM-PAC PT "6 Clicks" Daily Activity  Outcome Measure Difficulty turning over in bed (including adjusting bedclothes, sheets and blankets)?: A Little Difficulty moving from lying on back to sitting on the side of the bed? : A Little Difficulty sitting down on and standing up from a chair with arms (e.g., wheelchair, bedside commode, etc,.)?: A Little Help needed moving to and from a bed to chair (including a wheelchair)?: A Little Help needed walking in hospital room?: A Little Help needed climbing 3-5 steps with a railing? : A Lot 6 Click Score: 17    End of Session Equipment Utilized During Treatment: Gait belt Activity Tolerance: Patient tolerated treatment well Patient left: in chair;with family/visitor present;with call bell/phone within reach   PT Visit Diagnosis: Difficulty in walking, not elsewhere classified (R26.2);History of falling (Z91.81)    Time: 6578-4696 PT Time Calculation (min) (ACUTE ONLY): 24 min   Charges:   PT Evaluation $PT Eval Low Complexity: 1 Low PT Treatments $Gait Training: 8-22 mins        Kenyon Ana, PT Pager: [phone] 09/15/2018   [facility] [phone]   [facility] 09/15/2018, 1:30 PM

## 2018-09-15 NOTE — Care Management Obs Status (Signed)
[facility] NOTIFICATION   Patient Details  Name: [name] MRN: [id number] Date of Birth: [birth year]   Medicare Observation Status Notification Given:       Dessa Phi, RN 09/15/2018, 11:17 AM

## 2018-09-15 NOTE — Care Management CC44 (Signed)
Condition Code 44 Documentation Completed  Patient Details  Name: [name] MRN: [id number] Date of Birth: 1929-03-20   Condition Code 44 given:    Patient signature on Condition Code 44 notice:    Documentation of 2 MD's agreement:    Code 44 added to claim:       Dessa Phi, RN 09/15/2018, 11:18 AM

## 2018-09-15 NOTE — Evaluation (Signed)
Clinical/Bedside Swallow Evaluation Patient Details  Name: [name] MRN: [id number] Date of Birth: [birth year]  Today's Date: 09/15/2018 Time: SLP Start Time (ACUTE ONLY): 68 SLP Stop Time (ACUTE ONLY): 1040 SLP Time Calculation (min) (ACUTE ONLY): 20 min  Past Medical History:  Past Medical History:  Diagnosis Date  . Arthritis   . Arthritis pain   . Atrial fibrillation ([place])   . Breast cancer ([place]) 1991   right  . Cancer ([place])   . Constipation   . Environmental allergies   . Hemochromatosis 1998  . History of colon cancer   . HTN (hypertension)   . Hx of echocardiogram    Echo (8/15):  EF 45-50%, diff HK, mild AI, MAC, mild to mod MR, severe LAE, mod RAE, PASP 32 mmHg  . PONV (postoperative nausea and vomiting)   . Thyroid disease   . Varicose vein of leg    Past Surgical History:  Past Surgical History:  Procedure Laterality Date  . BREAST LUMPECTOMY Right 1991  . BREAST SURGERY  1991   Rt br lump removed-benign  . COLON SURGERY  1996   CANCER REMOVAL  . GALLBLADDER SURGERY  2005  . LIVER BIOPSY  04/08/2012   Procedure: LIVER BIOPSY;  Surgeon: Adin Hector, MD;  Location: [facility];  Service: General;;  laparoscopic  . TOTAL HIP ARTHROPLASTY  2004   RIGHT   HPI:  84 yo female adm to [facility] with 2 days cough and fever - diagnosed with pna.  PMH + for severe RA, recent colectomy, GERD, never smoked.  CXR showed right infrahilar suggestion of pna - likely within right middle lobe.  Daughter [name] present.  Swallow evaluation ordered by Dr Verlon Au.  Pt denies dysphagia but does admit to heartburn = feels like pressure in her back and front.  She reports she is taking a maintenance medication and Zantac at night prn.     Assessment / Plan / Recommendation Clinical Impression  Patient presents with functional oropharyngeal swallow ability. NO indications of airway compromise with all po observed including 3 ounce water test.  Swallow appears swift with no  indication of residuals nor aspiration.  Given pt does have recent diagnosis of GERD - provided her with reflux precautions.  Thanks for this referral.  SLP Visit Diagnosis: Dysphagia, unspecified (R13.10)    Aspiration Risk  Mild aspiration risk(likely due to reflux)    Diet Recommendation Regular;Thin liquid   Liquid Administration via: Cup;Straw Medication Administration: Whole meds with liquid Supervision: Patient able to self feed Compensations: Minimize environmental distractions;Slow rate;Small sips/bites Postural Changes: Seated upright at 90 degrees;Remain upright for at least 30 minutes after po intake    Other  Recommendations Oral Care Recommendations: Oral care BID   Follow up Recommendations None      Frequency and Duration   n/a         Prognosis   n/a     Swallow Study   General Date of Onset: 09/15/18 HPI: 84 yo female adm to [facility] with 2 days cough and fever - diagnosed with pna.  PMH + for severe RA, recent colectomy, GERD, never smoked.  CXR showed right infrahilar suggestion of pna - likely within right middle lobe.  Daughter [name] present.  Swallow evaluation ordered by Dr Verlon Au.  Pt denies dysphagia but does admit to heartburn = feels like pressure in her back and front.  She reports she is taking a maintenance medication and Zantac at night prn.  Type of Study: Bedside Swallow Evaluation Diet Prior to this Study: Regular;Thin liquids Temperature Spikes Noted: No Respiratory Status: Room air History of Recent Intubation: No Behavior/Cognition: Alert;Cooperative;Pleasant mood Oral Cavity Assessment: Within Functional Limits(posterior soft palate erythema) Oral Care Completed by SLP: No Oral Cavity - Dentition: Adequate natural dentition Vision: Functional for self-feeding Self-Feeding Abilities: Able to feed self Patient Positioning: Upright in bed Baseline Vocal Quality: Normal Volitional Cough: Strong Volitional Swallow: Able to elicit     Oral/Motor/Sensory Function Overall Oral Motor/Sensory Function: Within functional limits   Ice Chips Ice chips: Not tested   Thin Liquid Thin Liquid: Within functional limits Presentation: Cup    Nectar Thick Nectar Thick Liquid: Not tested   Honey Thick Honey Thick Liquid: Not tested   Puree Puree: Within functional limits Presentation: Spoon   Solid     Solid: Within functional limits Presentation: Self Fredirick Lathe 09/15/2018,11:14 AM Luanna Salk, [facility] SLP [place] Pager [phone] Office [phone]

## 2018-09-15 NOTE — Care Management Note (Signed)
Case Management Note  Patient Details  Name: [name] MRN: [id number] Date of Birth: Feb 03, 1929  Subjective/Objective: PT cons-await recc. D/c plan home.              Action/Plan:d/c home.   Expected Discharge Date:                  Expected Discharge Plan:     In-House Referral:     Discharge planning Services  CM Consult  Post Acute Care Choice:  Durable Medical Equipment(cane, rw) Choice offered to:     DME Arranged:    DME Agency:     HH Arranged:    HH Agency:     Status of Service:  In process, will continue to follow  If discussed at Long Length of Stay Meetings, dates discussed:    Additional Comments:  Dessa Phi, RN 09/15/2018, 11:20 AM

## 2018-09-15 NOTE — Progress Notes (Signed)
[facility] [place] for warfarin Indication: atrial fibrillation  Allergies  Allergen Reactions  . Tramadol Other (See Comments)    MENTAL CHANGES, VERY SNAPPY, MADE PATIENT FEEL WORSE AND PATIENT PREFERENCE NOT TO TAKE MED  . Adhesive [Tape] Rash  . Cardizem [Diltiazem Hcl] Other (See Comments) and Cough    Rash that caused dark spotting   . Latex Rash and Other (See Comments)    Per Patient Latex=lip swelling    Patient Measurements: Height: 5\' 3"  (160 cm) Weight: 170 lb 9.6 oz (77.4 kg) IBW/kg (Calculated) : 52.4  Vital Signs: Temp: 98 F (36.7 C) (09/16 0450) Temp Source: Oral (09/16 0450) BP: 120/82 (09/16 0450) Pulse Rate: 69 (09/16 0450)  Labs: Recent Labs    09/14/18 1355 09/14/18 1636 09/15/18 0519  HGB 13.6  --   --   HCT 42.1  --   --   PLT 142*  --   --   LABPROT  --  26.2* 28.7*  INR  --  2.43 2.73  CREATININE 0.55  --   --     Estimated Creatinine Clearance: 49.7 mL/min (by C-G formula based on SCr of 0.55 mg/dL).  Assessment: 22 yoF with PMH colon cancer, hypothyroid, HTN, HLD, Afib on warfarin, hemochromatosis, aortic valve insufficiency, admitted 9/15 for CAP. Pharmacy to dose coumadin while admitted.   admission INR 2.43  Prior anticoagulation: warfarin 1.5 mg daily  Significant events:  Today, 09/15/2018:  INR therapeutic  Major drug interactions: none, but on broad spectrum abx  No bleeding issues per nursing  Goal of Therapy: INR 2-3  Plan:  Resume home dose of coumadin 1.5 mg qhs  Daily INR  CBC at least q72 hr while on warfarin  Monitor for signs of bleeding or thrombosis  Change azithromycin to [place], Pharm.D [phone] 09/15/2018 7:37 AM

## 2018-09-15 NOTE — Discharge Summary (Signed)
Physician Discharge Summary  [name] GUR:[id number] DOB: [birth year] DOA: 09/14/2018  PCP: Lorene Dy, MD  Admit date: 09/14/2018 Discharge date: 09/15/2018  Admitted From: Home Disposition: Home  Recommendations for Outpatient Follow-up:  1. Follow up with PCP in 1-2 weeks 2. Please obtain BMP/CBC in one week  Home Health: None Equipment/Devices none Discharge Condition: Stable CODE STATUS full code Diet recommendation: Cardiac Brief/Interim Summary:88 f colon cancer-tubular adenoma status post laparoscopic right colectomy for 9.12 and prior surgery 1996 under Dr. Rosana Hoes for the same issue hypothyroid HTN HLD MR AoV insufficiency peripheral neuropathy hemochromatosis LVH systolic function 28-31% severe RA E, PA peak 47 07/01/2018 prior microscopic hematuria  Endorse 2-day history cough, feeling poorly, weakness, poor appetite-found to have fever at home p.m. 9/14 Saw PCP in the past 2 weeks prior, told likely reflux was given OTC meds and then Rx for the same guaifenesin and omeprazole   ED Course: febrile 103--given tylenol --UC and BC performed-antibiotics initiated later with IV fluid    Discharge Diagnoses:  Active Problems:   PNA (pneumonia)  1 community-acquired pneumonia patient admitted with fever and cough.  Chest x-ray showed right middle lobe consolidation.  She was treated with IV antibiotics.  She is now on room air oxygen saturation above 94%.  She is not febrile today.  She is anxious and requesting to go home.  Will discharge her on Zithromax.  She was seen by speech therapy for possible aspiration and the assessment from the speech therapist was patient is probably mild aspiration risk likely due to reflux.  2 history of GERD continue Prilosec  #3 atrial fibrillation continue Coumadin and Toprol  #4 hypertension patient is on multiple antihypertensives including Lasix 20 mg daily, lisinopril 2.5 mg, Cozaar 50 mg daily, Toprol 25 mg daily.  Blood  pressure is soft.  I will restart her Toprol in view of her atrial fibrillation for  rate control.  Consider restarting other medications as an outpatient if needed.  He only takes Lasix as needed.  Discharge Instructions  Discharge Instructions    Call MD for:  difficulty breathing, headache or visual disturbances   Complete by:  As directed    Call MD for:  persistant dizziness or light-headedness   Complete by:  As directed    Call MD for:  persistant nausea and vomiting   Complete by:  As directed    Diet - low sodium heart healthy   Complete by:  As directed    Increase activity slowly   Complete by:  As directed      Allergies as of 09/15/2018      Reactions   Tramadol Other (See Comments)   MENTAL CHANGES, VERY SNAPPY, MADE PATIENT FEEL WORSE AND PATIENT PREFERENCE NOT TO TAKE MED   Adhesive [tape] Rash   Cardizem [diltiazem Hcl] Other (See Comments), Cough   Rash that caused dark spotting    Latex Rash, Other (See Comments)   Per Patient Latex=lip swelling      Medication List    STOP taking these medications   acetaminophen 500 MG tablet Commonly known as:  TYLENOL   HYDROcodone-acetaminophen 5-325 MG tablet Commonly known as:  NORCO/VICODIN   lisinopril 2.5 MG tablet Commonly known as:  PRINIVIL,ZESTRIL   losartan 50 MG tablet Commonly known as:  COZAAR     TAKE these medications   atorvastatin 10 MG tablet Commonly known as:  LIPITOR Take 10 mg by mouth Daily.   azithromycin 250 MG tablet Commonly known  as:  ZITHROMAX Take 2 tablets (500 mg) on  Day 1,  followed by 1 tablet (250 mg) once daily on Days 2 through 5.   furosemide 20 MG tablet Commonly known as:  LASIX Take 20 mg by mouth Daily. EXTRA 20 MG PRN FOR EDEMA PT STATES PER PCP   gabapentin 100 MG capsule Commonly known as:  NEURONTIN Take 100 mg by mouth at bedtime.   guaiFENesin 600 MG 12 hr tablet Commonly known as:  MUCINEX Take 1 tablet (600 mg total) by mouth 2 (two) times daily.    levothyroxine 50 MCG tablet Commonly known as:  SYNTHROID, LEVOTHROID Take 50 mcg by mouth Daily.   meclizine 25 MG tablet Commonly known as:  ANTIVERT Take 25 mg by mouth 2 (two) times daily as needed for dizziness.   metoprolol succinate 25 MG 24 hr tablet Commonly known as:  TOPROL-XL Take 1 tablet (25 mg total) by mouth daily. What changed:  when to take this   omeprazole 20 MG capsule Commonly known as:  PRILOSEC Take 20 mg by mouth daily.   polyethylene glycol packet Commonly known as:  MIRALAX / GLYCOLAX Take 17 g by mouth daily as needed for moderate constipation.   senna 8.6 MG Tabs tablet Commonly known as:  SENOKOT Take 1 tablet by mouth 2 (two) times daily as needed for moderate constipation.   warfarin 3 MG tablet Commonly known as:  COUMADIN Take as directed. If you are unsure how to take this medication, talk to your nurse or doctor. Original instructions:  TAKE AS DIRECTED BY COUMADIN CLINIC What changed:  See the new instructions.      Follow-up Information    Lorene Dy, MD Follow up.   Specialty:  Internal Medicine Contact information: [address], [address] [place] [address] [phone]          Allergies  Allergen Reactions  . Tramadol Other (See Comments)    MENTAL CHANGES, VERY SNAPPY, MADE PATIENT FEEL WORSE AND PATIENT PREFERENCE NOT TO TAKE MED  . Adhesive [Tape] Rash  . Cardizem [Diltiazem Hcl] Other (See Comments) and Cough    Rash that caused dark spotting   . Latex Rash and Other (See Comments)    Per Patient Latex=lip swelling    Consultations: NONE  Procedures/Studies: Dg Chest 2 View  Result Date: 09/14/2018 CLINICAL DATA:  Fever today.  Recent history of acid reflux.  Cough. EXAM: CHEST - 2 VIEW COMPARISON:  Chest x-rays dated 04/24/2013 and 04/07/2013. FINDINGS: Ill-defined opacity within the RIGHT lower lung, infrahilar, suggesting pneumonia. No pleural effusion or pneumothorax seen. Stable cardiomegaly.  IMPRESSION: RIGHT infrahilar pneumonia, most likely within the RIGHT middle lobe. Electronically Signed   By: Franki Cabot M.D.   On: 09/14/2018 14:47    (Echo, Carotid, EGD, Colonoscopy, ERCP)    Subjective:   Discharge Exam: Vitals:   09/15/18 0450 09/15/18 0827  BP: 120/82 114/65  Pulse: 69   Resp: 18   Temp: 98 F (36.7 C)   SpO2: 96%    Vitals:   09/14/18 2042 09/14/18 2148 09/15/18 0450 09/15/18 0827  BP: (!) 104/56 107/65 120/82 114/65  Pulse: 73 71 69   Resp: 18 20 18    Temp: 97.8 F (36.6 C) 98.2 F (36.8 C) 98 F (36.7 C)   TempSrc: Oral Oral Oral   SpO2: 97% 96% 96%   Weight:      Height:        General: Pt is alert, awake, not in  acute distress Cardiovascular: RRR, S1/S2 +, no rubs, no gallops Respiratory: CTA bilaterally, no wheezing, no rhonchi Abdominal: Soft, NT, ND, bowel sounds + Extremities: no edema, no cyanosis    The results of significant diagnostics from this hospitalization (including imaging, microbiology, ancillary and laboratory) are listed below for reference.     Microbiology: Recent Results (from the past 240 hour(s))  Urine culture     Status: None   Collection Time: 09/14/18  2:15 PM  Result Value Ref Range Status   Specimen Description   Final    URINE, CLEAN CATCH Performed at [facility], [place] [address] [place], [place] [address]    Special Requests   Final    NONE Performed at [facility], [place] [address], [place], [place] [address]    Culture   Final    NO GROWTH Performed at [facility], [place] [address] [place], [place] [address]    Report Status 09/15/2018 FINAL  Final  Blood culture (routine x 2)     Status: None (Preliminary result)   Collection Time: 09/14/18  2:20 PM  Result Value Ref Range Status   Specimen Description   Final    BLOOD LEFT ARM Performed at [place] [address], [place], [place] [address]    Special Requests    Final    BOTTLES DRAWN AEROBIC AND ANAEROBIC Blood Culture adequate volume Performed at [place] [address], [place], [place] [address]    Culture   Final    NO GROWTH < 24 HOURS Performed at [place] [address] [place], [place] [address]    Report Status PENDING  Incomplete  Blood culture (routine x 2)     Status: None (Preliminary result)   Collection Time: 09/14/18  3:25 PM  Result Value Ref Range Status   Specimen Description   Final    BLOOD LEFT HAND Performed at [place] [address] [place], [place] [address]    Special Requests   Final    BOTTLES DRAWN AEROBIC AND ANAEROBIC Blood Culture adequate volume Performed at [place] [address], [place], [place] [address]    Culture   Final    NO GROWTH < 12 HOURS Performed at [place] [address] [place],  [address]    Report Status PENDING  Incomplete     Labs: BNP (last 3 results) No results for input(s): BNP in the last 8760 hours. Basic Metabolic Panel: Recent Labs  Lab 09/14/18 1355 09/15/18 0726  NA 143 142  K 3.6 3.5  CL 108 108  CO2 28 25  GLUCOSE 100* 79  BUN 20 17  CREATININE 0.55 0.54  CALCIUM 9.7 9.0   Liver Function Tests: Recent Labs  Lab 09/15/18 0726  AST 18  ALT 13  ALKPHOS 64  BILITOT 3.4*  PROT 5.5*  ALBUMIN 3.1*   No results for input(s): LIPASE, AMYLASE in the last 168 hours. No results for input(s): AMMONIA in the last 168 hours. CBC: Recent Labs  Lab 09/14/18 1355 09/15/18 0726  WBC 9.5 8.7  NEUTROABS 7.2 5.3  HGB 13.6 12.7  HCT 42.1 39.1  MCV 99.8 101.8*  PLT 142* 131*   Cardiac Enzymes: No results for input(s): CKTOTAL, CKMB, CKMBINDEX, TROPONINI in the last 168 hours. BNP: Invalid input(s): POCBNP CBG: No results for input(s): GLUCAP in the last 168 hours. D-Dimer No results  for input(s): DDIMER in the last 72 hours. Hgb A1c No results for  input(s): HGBA1C in the last 72 hours. Lipid Profile No results for input(s): CHOL, HDL, LDLCALC, TRIG, CHOLHDL, LDLDIRECT in the last 72 hours. Thyroid function studies No results for input(s): TSH, T4TOTAL, T3FREE, THYROIDAB in the last 72 hours.  Invalid input(s): FREET3 Anemia work up No results for input(s): VITAMINB12, FOLATE, FERRITIN, TIBC, IRON, RETICCTPCT in the last 72 hours. Urinalysis    Component Value Date/Time   COLORURINE YELLOW 09/14/2018 [place] 09/14/2018 1415   LABSPEC 1.019 09/14/2018 1415   PHURINE 5.0 09/14/2018 1415   GLUCOSEU NEGATIVE 09/14/2018 1415   HGBUR MODERATE (A) 09/14/2018 1415   BILIRUBINUR NEGATIVE 09/14/2018 1415   KETONESUR 5 (A) 09/14/2018 1415   PROTEINUR NEGATIVE 09/14/2018 1415   UROBILINOGEN 0.2 08/28/2015 1712   NITRITE NEGATIVE 09/14/2018 1415   LEUKOCYTESUR NEGATIVE 09/14/2018 1415   Sepsis Labs Invalid input(s): PROCALCITONIN,  WBC,  LACTICIDVEN Microbiology Recent Results (from the past 240 hour(s))  Urine culture     Status: None   Collection Time: 09/14/18  2:15 PM  Result Value Ref Range Status   Specimen Description   Final    URINE, CLEAN CATCH Performed at [facility], [place] [address] [place], [place] [address]    Special Requests   Final    NONE Performed at [facility], [place] [address], [place], [place] [address]    Culture   Final    NO GROWTH Performed at [facility], [place] [address] [place], [place] [address]    Report Status 09/15/2018 FINAL  Final  Blood culture (routine x 2)     Status: None (Preliminary result)   Collection Time: 09/14/18  2:20 PM  Result Value Ref Range Status   Specimen Description   Final    BLOOD LEFT ARM Performed at [place] [address], [place], [place] [address]    Special Requests   Final    BOTTLES DRAWN AEROBIC AND ANAEROBIC Blood Culture adequate volume Performed at [place] [address] [place], [place] [address]    Culture   Final    NO GROWTH < 24 HOURS Performed at [place] [address], [place], [place] [address]    Report Status PENDING  Incomplete  Blood culture (routine x 2)     Status: None (Preliminary result)   Collection Time: 09/14/18  3:25 PM  Result Value Ref Range Status   Specimen Description   Final    BLOOD LEFT HAND Performed at [place] [address], [place], [place] [address]    Special Requests   Final    BOTTLES DRAWN AEROBIC AND ANAEROBIC Blood Culture adequate volume Performed at [place] [address], [place], [place] [address]    Culture   Final    NO GROWTH < 12 HOURS Performed at [place] [address], [place],  [address]    Report Status PENDING  Incomplete     Time coordinating discharge:  34 minutes  SIGNED:   Georgette Shell, MD  [facility] 09/15/2018, 11:21 AM Pager   If 7PM-7AM, please contact night-coverage [email or web address] Password [email or web address]

## 2018-09-19 LAB — CULTURE, BLOOD (ROUTINE X 2)
Culture: NO GROWTH
Culture: NO GROWTH
SPECIAL REQUESTS: ADEQUATE
SPECIAL REQUESTS: ADEQUATE

## 2018-10-01 ENCOUNTER — Telehealth: Payer: Self-pay | Admitting: Pharmacist

## 2018-10-01 ENCOUNTER — Ambulatory Visit (INDEPENDENT_AMBULATORY_CARE_PROVIDER_SITE_OTHER): Payer: Medicare Other | Admitting: Pharmacist

## 2018-10-01 DIAGNOSIS — Z7901 Long term (current) use of anticoagulants: Secondary | ICD-10-CM

## 2018-10-01 DIAGNOSIS — I4819 Other persistent atrial fibrillation: Secondary | ICD-10-CM

## 2018-10-01 DIAGNOSIS — I4891 Unspecified atrial fibrillation: Secondary | ICD-10-CM

## 2018-10-01 DIAGNOSIS — Z5181 Encounter for therapeutic drug level monitoring: Secondary | ICD-10-CM

## 2018-10-01 LAB — POCT INR: INR: 2.2 (ref 2.0–3.0)

## 2018-10-01 NOTE — Telephone Encounter (Signed)
New Message:    [place] eith Dr. Lorene Dy   Had the Rocephin Injection 200mg   Along with Prednisone

## 2018-10-01 NOTE — Patient Instructions (Signed)
Description   Continue taking same dosage of 1/2 tablet every day.  Recheck INR in 4 weeks. [facility] [phone]. Main # [phone].      

## 2018-10-01 NOTE — Telephone Encounter (Signed)
Spoke with pt daughter and clarified not oral pred. Continue as documented in [place] visit.

## 2018-11-04 ENCOUNTER — Ambulatory Visit (INDEPENDENT_AMBULATORY_CARE_PROVIDER_SITE_OTHER): Payer: Medicare Other | Admitting: *Deleted

## 2018-11-04 DIAGNOSIS — I4819 Other persistent atrial fibrillation: Secondary | ICD-10-CM

## 2018-11-04 DIAGNOSIS — Z5181 Encounter for therapeutic drug level monitoring: Secondary | ICD-10-CM

## 2018-11-04 DIAGNOSIS — I4891 Unspecified atrial fibrillation: Secondary | ICD-10-CM

## 2018-11-04 DIAGNOSIS — Z7901 Long term (current) use of anticoagulants: Secondary | ICD-10-CM

## 2018-11-04 LAB — POCT INR: INR: 2.4 (ref 2.0–3.0)

## 2018-11-04 NOTE — Patient Instructions (Signed)
Description   Continue taking same dosage of 1/2 tablet every day.  Recheck INR in 5 weeks. [facility] [phone]. Main # [phone].

## 2018-11-25 ENCOUNTER — Other Ambulatory Visit: Payer: Self-pay | Admitting: Internal Medicine

## 2018-11-25 DIAGNOSIS — Z1231 Encounter for screening mammogram for malignant neoplasm of breast: Secondary | ICD-10-CM

## 2018-12-08 ENCOUNTER — Encounter (INDEPENDENT_AMBULATORY_CARE_PROVIDER_SITE_OTHER): Payer: Self-pay

## 2018-12-08 ENCOUNTER — Ambulatory Visit (INDEPENDENT_AMBULATORY_CARE_PROVIDER_SITE_OTHER): Payer: Medicare Other | Admitting: *Deleted

## 2018-12-08 DIAGNOSIS — Z7901 Long term (current) use of anticoagulants: Secondary | ICD-10-CM | POA: Diagnosis not present

## 2018-12-08 DIAGNOSIS — I4819 Other persistent atrial fibrillation: Secondary | ICD-10-CM

## 2018-12-08 DIAGNOSIS — I4891 Unspecified atrial fibrillation: Secondary | ICD-10-CM | POA: Diagnosis not present

## 2018-12-08 DIAGNOSIS — Z5181 Encounter for therapeutic drug level monitoring: Secondary | ICD-10-CM | POA: Diagnosis not present

## 2018-12-08 LAB — POCT INR: INR: 2.3 (ref 2.0–3.0)

## 2018-12-08 NOTE — Patient Instructions (Signed)
Description   Continue taking same dosage of 1/2 tablet every day.  Recheck INR in 4 weeks. [facility] [phone]. Main # [phone].

## 2018-12-22 ENCOUNTER — Other Ambulatory Visit: Payer: Self-pay | Admitting: Cardiovascular Disease

## 2019-01-05 ENCOUNTER — Ambulatory Visit
Admission: RE | Admit: 2019-01-05 | Discharge: 2019-01-05 | Disposition: A | Discharge status: Home / Self Care | Payer: Medicare Other | Source: Ambulatory Visit | Attending: Internal Medicine | Admitting: Internal Medicine

## 2019-01-05 ENCOUNTER — Ambulatory Visit (INDEPENDENT_AMBULATORY_CARE_PROVIDER_SITE_OTHER): Payer: Medicare Other

## 2019-01-05 DIAGNOSIS — I4819 Other persistent atrial fibrillation: Secondary | ICD-10-CM | POA: Diagnosis not present

## 2019-01-05 DIAGNOSIS — Z5181 Encounter for therapeutic drug level monitoring: Secondary | ICD-10-CM | POA: Diagnosis not present

## 2019-01-05 DIAGNOSIS — I4891 Unspecified atrial fibrillation: Secondary | ICD-10-CM | POA: Diagnosis not present

## 2019-01-05 DIAGNOSIS — Z7901 Long term (current) use of anticoagulants: Secondary | ICD-10-CM

## 2019-01-05 DIAGNOSIS — Z1231 Encounter for screening mammogram for malignant neoplasm of breast: Secondary | ICD-10-CM

## 2019-01-05 LAB — POCT INR: INR: 2.3 (ref 2.0–3.0)

## 2019-01-05 NOTE — Patient Instructions (Signed)
Description   Continue taking same dosage of 1/2 tablet every day.  Recheck INR in 6 weeks. [facility] [phone]. Main # [phone].

## 2019-01-23 ENCOUNTER — Other Ambulatory Visit: Payer: Self-pay | Admitting: Obstetrics and Gynecology

## 2019-01-23 DIAGNOSIS — N840 Polyp of corpus uteri: Secondary | ICD-10-CM

## 2019-01-30 ENCOUNTER — Ambulatory Visit
Admission: RE | Admit: 2019-01-30 | Discharge: 2019-01-30 | Disposition: A | Discharge status: Home / Self Care | Payer: Medicare Other | Source: Ambulatory Visit | Attending: Obstetrics and Gynecology | Admitting: Obstetrics and Gynecology

## 2019-01-30 DIAGNOSIS — N840 Polyp of corpus uteri: Secondary | ICD-10-CM

## 2019-01-30 MED ORDER — IOPAMIDOL (ISOVUE-300) INJECTION 61%
100.0000 mL | Freq: Once | INTRAVENOUS | Status: AC | PRN
  Administered 2019-01-30: 100 mL via INTRAVENOUS

## 2019-02-02 ENCOUNTER — Telehealth: Payer: Self-pay | Admitting: Pharmacist

## 2019-02-02 NOTE — Telephone Encounter (Signed)
Pt called to report that she will be having procedure (unscheduled) with Dr. Matthew Saras. This is likely to be hysterectomy. Advised that she be sure to contact our office once procedure finalized and scheduled so that we can coordinate anticoagulation appropriately.   She takes warfarin for Afib with CHADSVASC of 4. Ok to hold warfarin without lovenox bridge.

## 2019-02-03 ENCOUNTER — Inpatient Hospital Stay: Payer: Medicare Other | Attending: Gynecologic Oncology | Admitting: Gynecologic Oncology

## 2019-02-03 ENCOUNTER — Encounter: Payer: Self-pay | Admitting: Gynecologic Oncology

## 2019-02-03 ENCOUNTER — Encounter: Payer: Self-pay | Admitting: Oncology

## 2019-02-03 VITALS — BP 114/75 | HR 94 | Temp 97.9°F | Resp 18 | Ht 63.0 in | Wt 168.0 lb

## 2019-02-03 DIAGNOSIS — C541 Malignant neoplasm of endometrium: Secondary | ICD-10-CM | POA: Diagnosis present

## 2019-02-03 NOTE — Progress Notes (Signed)
Consult Note: Gyn-Onc  Consult was requested by Dr. Matthew Saras for the evaluation of [name] 85 y.o. female  CC:  Chief Complaint  Patient presents with  . uterine tumor    Assessment/Plan:  [name]  is a 85 y.o.  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  We will perform PET/CT to evaluate the suspicious nodes on CT scan and evaluate for the extent of distant disease.  I recommend holding coumadin until after definitive therapy for her uterus.  This is because she has significant bleeding from the tumor and I am concerned about severe anemia should she continue on the Coumadin.  I explained that this does increase her risk for CVA however, we have to balance the risk of significant bleeding and complications from this given her active uncontrolled tumor bleeding.  I explained that when I see her back after the final pathology was resulted and after her PET scan has been read we will discuss further 2 options.  There are other proceeding with surgical resection with hysterectomy BSO and lymphadenectomy (I feel she may be a candidate for minimally invasive approach based on my exam findings today), or proceeding with definitive radiation therapy if she elects for a more conservative approach.  Given her advanced age this is not an unreasonable thing to consider.   HPI: [name] is a 85 year old P4 who was seen in consultation at the request of Dr. Matthew Saras for uterine tumor.  The patient reports developing vaginal bleeding in January 2020.  Similar to a menstrual-like flow.  She takes Coumadin for atrial fibrillation and felt that this was the cause of it.  Over the subsequent months she developed passage of clots and tissue.  She was seen by Dr. Matthew Saras who performed an endometrial biopsy in the office on Jerry 21st 2020 which revealed only blood clot and no endometrial tissue was identified.    A Ca1 25 was drawn on January 20, 2019  which was elevated at 73.7.  A transvaginal ultrasound scan was performed that same day which revealed a uterus that was enlarged measuring 11.3 x 5.7 x 8.3 cm.  The endometrium was significantly distended with blood clot.  No definite endometrial stripe was seen.  I 4.3 x 7.2 cm hyperechoic area was seen in the endometrial cavity extending into the cervix.  The ovaries were grossly normal bilaterally.    Given these findings a CT scan of the abdomen and pelvis was ordered and performed on January 30, 2019.  It revealed several renal cysts.  A 12 mm soft tissue density just superior to the celiac axis suspicious for mildly enlarged lymph nodes.  No other lymphadenopathy in the pelvis.  The uterus is enlarged with marked distention of the endometrial cavity by low-attenuation soft tissue.  No evidence of extrauterine extension and unremarkable adnexal regions.  Current Meds:  Outpatient Encounter Medications as of 02/03/2019  Medication Sig  . atorvastatin (LIPITOR) 10 MG tablet Take 10 mg by mouth Daily.   . furosemide (LASIX) 20 MG tablet Take 20 mg by mouth Daily. EXTRA 20 MG PRN FOR EDEMA PT STATES PER PCP  . gabapentin (NEURONTIN) 100 MG capsule Take 100 mg by mouth at bedtime.   [place] guaiFENesin (MUCINEX) 600 MG 12 hr tablet Take 1 tablet (600 mg total) by mouth 2 (two) times daily.  [place] levothyroxine (SYNTHROID, LEVOTHROID) 50 MCG tablet Take 50 mcg by mouth Daily.   . meclizine (ANTIVERT)  25 MG tablet Take 25 mg by mouth 2 (two) times daily as needed for dizziness.   . metoprolol succinate (TOPROL XL) 25 MG 24 hr tablet Take 1 tablet (25 mg total) by mouth daily. (Patient taking differently: Take 25 mg by mouth at bedtime. )  . omeprazole (PRILOSEC) 20 MG capsule Take 20 mg by mouth daily.  . polyethylene glycol (MIRALAX / GLYCOLAX) packet Take 17 g by mouth daily as needed for moderate constipation.   . senna (SENOKOT) 8.6 MG TABS Take 1 tablet by mouth 2 (two) times daily as needed for moderate  constipation.   [place] Kitchen warfarin (COUMADIN) 3 MG tablet TAKE AS DIRECTED BY COUMADIN CLINIC   No facility-administered encounter medications on file as of 02/03/2019.     Allergy:  Allergies  Allergen Reactions  . Tramadol Other (See Comments)    MENTAL CHANGES, VERY SNAPPY, MADE PATIENT FEEL WORSE AND PATIENT PREFERENCE NOT TO TAKE MED  . Adhesive [Tape] Rash  . Cardizem [Diltiazem Hcl] Other (See Comments) and Cough    Rash that caused dark spotting   . Latex Rash and Other (See Comments)    Per Patient Latex=lip swelling    Social Hx:   Social History   Socioeconomic History  . Marital status: Married    Spouse name: Not on file  . Number of children: Not on file  . Years of education: Not on file  . Highest education level: Not on file  Occupational History  . Occupation: RETIRED    Employer: [employer]  Social Needs  . Financial resource strain: Not on file  . Food insecurity:    Worry: Not on file    Inability: Not on file  . Transportation needs:    Medical: Not on file    Non-medical: Not on file  Tobacco Use  . Smoking status: Never Smoker  . Smokeless tobacco: Never Used  Substance and Sexual Activity  . Alcohol use: No  . Drug use: No  . Sexual activity: Not on file  Lifestyle  . Physical activity:    Days per week: Not on file    Minutes per session: Not on file  . Stress: Not on file  Relationships  . Social connections:    Talks on phone: Not on file    Gets together: Not on file    Attends religious service: Not on file    Active member of club or organization: Not on file    Attends meetings of clubs or organizations: Not on file    Relationship status: Not on file  . Intimate partner violence:    Fear of current or ex partner: Not on file    Emotionally abused: Not on file    Physically abused: Not on file    Forced sexual activity: Not on file  Other Topics Concern  . Not on file  Social History Narrative  . Not on file    Past Surgical  Hx:  Past Surgical History:  Procedure Laterality Date  . BREAST EXCISIONAL BIOPSY Right 1991   benign  . BREAST SURGERY  1991   Rt br lump removed-benign  . COLON SURGERY  1996   CANCER REMOVAL  . GALLBLADDER SURGERY  2005  . LIVER BIOPSY  04/08/2012   Procedure: LIVER BIOPSY;  Surgeon: Adin Hector, MD;  Location: [place];  Service: General;;  laparoscopic  . TOTAL HIP ARTHROPLASTY  2004   RIGHT    Past Medical Hx:  Past Medical  History:  Diagnosis Date  . Arthritis   . Arthritis pain   . Atrial fibrillation ([place])   . Cancer ([place])    colon, 1996  . Constipation   . Environmental allergies   . Hemochromatosis 1998  . History of colon cancer   . HTN (hypertension)   . Hx of echocardiogram    Echo (8/15):  EF 45-50%, diff HK, mild AI, MAC, mild to mod MR, severe LAE, mod RAE, PASP 32 mmHg  . PONV (postoperative nausea and vomiting)   . Thyroid disease   . Varicose vein of leg     Past Gynecological History:  See HPI No LMP recorded. Patient is postmenopausal.  Family Hx:  Family History  Problem Relation Age of Onset  . Heart disease Mother   . Stroke Mother   . Heart disease Father   . Pneumonia Brother   . Hypertension Maternal Grandfather   . Heart attack Neg Hx   . Breast cancer Neg Hx     Review of Systems:  Constitutional  Feels well,    ENT Normal appearing ears and nares bilaterally Skin/Breast  No rash, sores, jaundice, itching, dryness Cardiovascular  No chest pain, shortness of breath, or edema  Pulmonary  No cough or wheeze.  Gastro Intestinal  No nausea, vomitting, or diarrhoea. No bright red blood per rectum, no abdominal pain, change in bowel movement, or constipation.  Genito Urinary  No frequency, urgency, dysuria, + bleeding and passage of tissue Musculo Skeletal  No myalgia, arthralgia, joint swelling or pain  Neurologic  No weakness, numbness, change in gait,  Psychology  No depression, anxiety, insomnia.   Vitals:  Blood  pressure 114/75, pulse 94, temperature 97.9 F (36.6 C), temperature source Oral, resp. rate 18, height 5' 3"  (1.6 m), weight 168 lb (76.2 kg), SpO2 96 %.  Physical Exam: WD in NAD Neck  Supple NROM, without any enlargements.  Lymph Node Survey No cervical supraclavicular or inguinal adenopathy Cardiovascular  Pulse normal rate, regularity and rhythm. S1 and S2 normal.  Lungs  Clear to auscultation bilateraly, without wheezes/crackles/rhonchi. Good air movement.  Skin  No rash/lesions/breakdown  Psychiatry  Alert and oriented to person, place, and time  Abdomen  Normoactive bowel sounds, abdomen soft, non-tender and nonobese without evidence of hernia.  Back No CVA tenderness Genito Urinary  Vulva/vagina: Normal external female genitalia.   No lesions. No discharge or bleeding.  Bladder/urethra:  No lesions or masses, well supported bladder  Vagina: normal  Cervix: widely dilated. There are large (5+cm) fragments of fleshy tumor that have separated and been delivered from the body. These were sent for pathology. The cervix is palpably normal  Uterus:  Mildly enlarged, mobile, no parametrial involvement or nodularity.  Adnexa: no discrete masses. Rectal  Good tone, no masses no cul de sac nodularity.  Extremities  No bilateral cyanosis, clubbing or edema.  Procedure Note:  Preop Dx: postmenopausal bleeding Postop Dx: same Procedure: endometrial biopsy Surgeon: Dorann Ou, MD EBL: <20cc Specimens: endometrial biopsy, endometrial tissue Complications: none Procedure Details: The patient provided verbal consent and verbal timeout was performed.  The large endometrial tissue fragments that had been delivered from the vagina was sent for permanent pathology.  The speculum was placed in the vagina and the cervix was visualized it was widely dilated to approximately 3 cm.  The cervix itself was otherwise normal-appearing the endometrial [place] was inserted to a depth of 7 cm where it  met firm resistance.  Aspiration took place  but this appeared to be mostly blood.  An endocervical curette was introduced to gently scrape from the level of the obstruction and this was sent as the same specimen.  Monsel solution was applied to the dilated cervix and the bleeding bed of tumor.  This was grossly hemostatic at the completion of the procedure.  The specimens were sent for histopathology.  Patient tolerated procedure well.    Thereasa Solo, MD  02/03/2019, 4:57 PM

## 2019-02-03 NOTE — Patient Instructions (Signed)
Plan to have a PET scan and follow up in the office after to discuss biopsy results, scan results, and recommendations moving forward whether it be surgery or radiation.  Potential surgery dates include Feb 18, Feb 27 or March 5 if laparoscopic time becomes available, March 12.     External Beam Radiation Therapy External beam radiation therapy is a type of radiation treatment. This type of radiation therapy can deliver radiation to a fairly large area. This is the most common type of radiation therapy for cancer. This therapy may be done to:  Treat cancer by: ? Destroying cancer cells. Radiation delivered during the treatment damages cancer cells. It may also damage normal cells, but normal cells have the DNA to repair themselves while cancer cells do not. ? Helping with symptoms of your cancer. ? Stopping the growth of any remaining cancer cells after surgery. ? Preventing cancer cells from growing in areas that do not have cancer (prophylactic radiation therapy).  Treat or shrink a tumor.  Reduce pain (palliative therapy). The amount of radiation you will receive and the length of therapy depend on your medical condition. You should not feel the radiation being delivered or any pain during your therapy. Let your health care provider know about:  Any allergies you have.  All medicines you are taking, including vitamins, herbs, eye drops, creams, and over-the-counter medicines.  Any problems you or family members have had with anesthetic medicines.  Any blood disorders you have.  Any surgeries you have had.  Any medical conditions you have.  Whether you are pregnant or may be pregnant. What are the risks? Generally, this is a safe procedure. However, radiation therapy can place a person at a higher risk for a second cancer later in life. Most people experience side effects from the therapy. Side effects depend on the amount of radiation and the part of the body that was exposed to  radiation. The most common side effects include:  Skin changes.  Fatigue.  Nausea and vomiting. What happens before the procedure?  There will be a planning session (simulation). During the session: ? Your health care provider will plan exactly where the radiation will be delivered (treatment field). ? You will be positioned for your therapy. The goal is to have a position that can be reproduced for each therapy session. ? Temporary marks may be drawn on your body. Permanent marks may also be drawn on your body in order for you to be positioned the same way for each therapy session. ? A tool that holds a body part in place (immobilization device) may be used to keep the area of treatment in the correct position.  Follow instructions from your health care provider about eating or drinking restrictions.  Ask your health care provider about changing or stopping your regular medicines. This is especially important if you are taking diabetes medicines or blood thinners. What happens during the procedure?   You will either lie on a table or sit in a chair in the position determined for your therapy.  You may have a heavy shield placed on you to protect tissues and organs that are not being treated.  The radiation machine (linear accelerator) will move around you to deliver the radiation in exact doses from different angles. The machine will not touch you. The procedure may vary among health care providers and hospitals. What happens after the procedure?  You may return to your normal routine including diet, activities, and medicines as told by your  health care provider.  You may want to have someone take you home from the hospital or clinic. Summary  External beam radiation therapy is a type of radiation treatment for cancer.  The amount of radiation you will receive and the length of therapy depend on your medical condition.  Most people experience side effects from the therapy. Side  effects depend on the amount of radiation and the part of the body that was exposed to radiation. This information is not intended to replace advice given to you by your health care provider. Make sure you discuss any questions you have with your health care provider. Document Released: 05/05/2009 Document Revised: 12/17/2016 Document Reviewed: 12/17/2016 Elsevier Interactive Patient Education  2019 [employer].

## 2019-02-04 ENCOUNTER — Telehealth: Payer: Self-pay | Admitting: *Deleted

## 2019-02-04 NOTE — Telephone Encounter (Signed)
Pt called and stated she saw Dr. Denman George with the [facility] and she was instructed to stop her Coumadin/Warfarin for her pending procedure since she is having bleeding issues. Pt states the risks of CVA and blood clots has been explained to her by Dr. Serita Grit office for having to stop her Coumadin and Warfarin. Notified pt that normally we get a clearance form to hold the Coumadin/Warfarin and she stated this is an emergency to stop her vaginal bleeding issues and per the Doctor she was instructed to do so.  She states she is not sure of procedure date but will update [place] as needed but for now she will need to cancel her appt with [place] that was set for 02/13/2019-appt canceled & pt to call back once Coumadin/Warfarin resumed.  She asked that I update Dr. Angelena Form and will forward to him as an FYI at this time.

## 2019-02-04 NOTE — Telephone Encounter (Signed)
Thanks

## 2019-02-06 ENCOUNTER — Telehealth: Payer: Self-pay | Admitting: Gynecologic Oncology

## 2019-02-06 ENCOUNTER — Telehealth: Payer: Self-pay

## 2019-02-06 NOTE — Telephone Encounter (Signed)
The number that was left for the patient had a voicemail message for the patient's daughter Marlowe Kays.  Left a message asking her to please call the office to discuss results of biopsy.

## 2019-02-06 NOTE — Telephone Encounter (Addendum)
Pt's daughter Marlowe Kays returned call from [place] NP- she is listed as someone we have permission to speak with.  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 Daughter can speak with her mother (patient) about her thoughts regarding surgery or radiation. " Daughter voiced understanding and I let her know there is a physician on call during weekend if concern with patient but otherwise Melissa NP will be back on Monday and can touch base then.  Daughter voiced understanding and no other needs per her at this time.

## 2019-02-09 ENCOUNTER — Telehealth: Payer: Self-pay | Admitting: Gynecologic Oncology

## 2019-02-09 NOTE — Telephone Encounter (Signed)
Called and spoke with patient's daughter.  Informed of patient's biopsy results.  Marlowe Kays states that she thinks her mother will decide against radiation but she is not 100% sure.  Her decision will most likely rest on the PET scan results.  She would like to go ahead and schedule her for surgery on Feb 18 with the knowledge that it can be cancelled at any time.  She is currently off of the Coumadin at this time. All questions answered. Advised to call for any needs.

## 2019-02-10 ENCOUNTER — Other Ambulatory Visit: Payer: Self-pay | Admitting: Gynecologic Oncology

## 2019-02-10 ENCOUNTER — Ambulatory Visit: Payer: Medicare Other | Admitting: Gynecology

## 2019-02-10 DIAGNOSIS — C541 Malignant neoplasm of endometrium: Secondary | ICD-10-CM

## 2019-02-11 ENCOUNTER — Ambulatory Visit (HOSPITAL_COMMUNITY)
Admission: RE | Admit: 2019-02-11 | Discharge: 2019-02-11 | Disposition: A | Discharge status: Home / Self Care | Payer: Medicare Other | Source: Ambulatory Visit | Attending: Gynecologic Oncology | Admitting: Gynecologic Oncology

## 2019-02-11 DIAGNOSIS — C541 Malignant neoplasm of endometrium: Secondary | ICD-10-CM | POA: Insufficient documentation

## 2019-02-11 LAB — GLUCOSE, CAPILLARY: Glucose-Capillary: 71 mg/dL (ref 70–99)

## 2019-02-11 MED ORDER — FLUDEOXYGLUCOSE F - 18 (FDG) INJECTION
9.5000 | Freq: Once | INTRAVENOUS | Status: AC
  Administered 2019-02-11: 9.5 via INTRAVENOUS

## 2019-02-12 ENCOUNTER — Telehealth: Payer: Self-pay | Admitting: Gynecologic Oncology

## 2019-02-12 NOTE — Telephone Encounter (Signed)
Called daughter and answered all questions.

## 2019-02-13 ENCOUNTER — Encounter: Payer: Self-pay | Admitting: Gynecologic Oncology

## 2019-02-13 ENCOUNTER — Inpatient Hospital Stay (HOSPITAL_BASED_OUTPATIENT_CLINIC_OR_DEPARTMENT_OTHER): Payer: Medicare Other | Admitting: Gynecologic Oncology

## 2019-02-13 ENCOUNTER — Telehealth: Payer: Self-pay | Admitting: Cardiovascular Disease

## 2019-02-13 ENCOUNTER — Telehealth: Payer: Self-pay | Admitting: *Deleted

## 2019-02-13 VITALS — BP 146/83 | HR 68 | Temp 97.6°F | Resp 18

## 2019-02-13 DIAGNOSIS — C541 Malignant neoplasm of endometrium: Secondary | ICD-10-CM | POA: Diagnosis not present

## 2019-02-13 DIAGNOSIS — C549 Malignant neoplasm of corpus uteri, unspecified: Secondary | ICD-10-CM

## 2019-02-13 NOTE — Patient Instructions (Addendum)
You will see Dr. Alvy Bimler on Tuesday to discuss chemotherapy.  We are trying to move up your cardiology appointment and Dr Denman George will see you next Friday.

## 2019-02-13 NOTE — Telephone Encounter (Signed)
Left Gayle from Dr Serita Grit office a message to call back to schedule patient for a sooner appt, as she requested.  Availability 2/19?

## 2019-02-13 NOTE — Patient Instructions (Signed)
[name]  02/13/2019   Your procedure is scheduled on: 02/17/2019   Report to [facility] Main  Entrance  Report to admitting at   0800 AM    Call this number if you have problems the morning of surgery [phone]   Remember: Do not eat food :After Midnight. BRUSH YOUR TEETH MORNING OF SURGERY AND RINSE YOUR MOUTH OUT, NO CHEWING GUM CANDY OR MINTS.  Eat a light diet the day before surgery.  Examples include: soups, broths, yogurt, toast and mashed potatoes.  Things to avoid include carbonated beverages, raw fruits and vegetables and beans.   NO SOLID FOOD AFTER MIDNIGHT THE NIGHT PRIOR TO SURGERY. NOTHING BY MOUTH EXCEPT CLEAR LIQUIDS UNTIL 3 HOURS PRIOR TO [place] SURGERY. PLEASE FINISH ENSURE DRINK PER SURGEON ORDER 3 HOURS PRIOR TO SCHEDULED SURGERY TIME WHICH NEEDS TO BE COMPLETED AT ____0700am ________.    CLEAR LIQUID DIET   Foods Allowed                                                                     Foods Excluded  Coffee and tea, regular and decaf                             liquids that you cannot  Plain Jell-O in any flavor                                             see through such as: Fruit ices (not with fruit pulp)                                     milk, soups, orange juice  Iced Popsicles                                    All solid food                                    Cranberry, grape and apple juices Sports drinks like Gatorade Lightly seasoned clear broth or consume(fat free) Sugar, honey syrup  Sample Menu Breakfast                                Lunch                                     Supper Cranberry juice                    Beef broth  Chicken broth Jell-O                                     Grape juice                           Apple juice Coffee or tea                        Jell-O                                      Popsicle                                                Coffee or tea                         Coffee or tea  _____________________________________________________________________    Take these medicines the morning of surgery with A SIP OF WATER: Synthroid, Toprol, Prilosec                                 You may not have any metal on your body including hair pins and              piercings  Do not wear jewelry, make-up, lotions, powders or perfumes, deodorant             Do not wear nail polish.  Do not shave  48 hours prior to surgery.                Do not bring valuables to the hospital. [name].  Contacts, dentures or bridgework may not be worn into surgery.  Leave suitcase in the car. After surgery it may be brought to your room.   Special Instructions: coughing and deep breathing exercises, leg exercises               Please read over the following fact sheets you were given: _____________________________________________________________________             [facility] - Preparing for Surgery Before surgery, you can play an important role.  Because skin is not sterile, your skin needs to be as free of germs as possible.  You can reduce the number of germs on your skin by washing with CHG (chlorahexidine gluconate) soap before surgery.  CHG is an antiseptic cleaner which kills germs and bonds with the skin to continue killing germs even after washing. Please DO NOT use if you have an allergy to CHG or antibacterial soaps.  If your skin becomes reddened/irritated stop using the CHG and inform your nurse when you arrive at Short Stay. Do not shave (including legs and underarms) for at least 48 hours prior to the first CHG shower.  You may shave your face/neck. Please follow these instructions carefully:  1.  Shower with CHG Soap the night before surgery and the  morning of Surgery.  2.  If you choose to wash your hair, wash your hair first as usual with your  normal  shampoo.  3.  After you shampoo,  rinse your hair and body thoroughly to remove the  shampoo.                           4.  Use CHG as you would any other liquid soap.  You can apply chg directly  to the skin and wash                       Gently with a scrungie or clean washcloth.  5.  Apply the CHG Soap to your body ONLY FROM THE NECK DOWN.   Do not use on face/ open                           Wound or open sores. Avoid contact with eyes, ears mouth and genitals (private parts).                       Wash face,  Genitals (private parts) with your normal soap.             6.  Wash thoroughly, paying special attention to the area where your surgery  will be performed.  7.  Thoroughly rinse your body with warm water from the neck down.  8.  DO NOT shower/wash with your normal soap after using and rinsing off  the CHG Soap.                9.  Pat yourself dry with a clean towel.            10.  Wear clean pajamas.            11.  Place clean sheets on your bed the night of your first shower and do not  sleep with pets. Day of Surgery : Do not apply any lotions/deodorants the morning of surgery.  Please wear clean clothes to the hospital/surgery center.  FAILURE TO FOLLOW THESE INSTRUCTIONS MAY RESULT IN THE CANCELLATION OF YOUR SURGERY PATIENT SIGNATURE_________________________________  NURSE SIGNATURE__________________________________  ________________________________________________________________________  WHAT IS A BLOOD TRANSFUSION? Blood Transfusion Information  A transfusion is the replacement of blood or some of its parts. Blood is made up of multiple cells which provide different functions.  Red blood cells carry oxygen and are used for blood loss replacement.  White blood cells fight against infection.  Platelets control bleeding.  Plasma helps clot blood.  Other blood products are available for specialized needs, such as hemophilia or other clotting disorders. BEFORE THE TRANSFUSION  Who gives blood for  transfusions?   Healthy volunteers who are fully evaluated to make sure their blood is safe. This is blood bank blood. Transfusion therapy is the safest it has ever been in the practice of medicine. Before blood is taken from a donor, a complete history is taken to make sure that person has no history of diseases nor engages in risky social behavior (examples are intravenous drug use or sexual activity with multiple partners). The donor's travel history is screened to minimize risk of transmitting infections, such as malaria. The donated blood is tested for signs of infectious diseases, such as HIV and hepatitis. The blood is then tested to be sure it is compatible with you in order to minimize the chance of a transfusion  reaction. If you or a relative donates blood, this is often done in anticipation of surgery and is not appropriate for emergency situations. It takes many days to process the donated blood. RISKS AND COMPLICATIONS Although transfusion therapy is very safe and saves many lives, the main dangers of transfusion include:   Getting an infectious disease.  Developing a transfusion reaction. This is an allergic reaction to something in the blood you were given. Every precaution is taken to prevent this. The decision to have a blood transfusion has been considered carefully by your caregiver before blood is given. Blood is not given unless the benefits outweigh the risks. AFTER THE TRANSFUSION  Right after receiving a blood transfusion, you will usually feel much better and more energetic. This is especially true if your red blood cells have gotten low (anemic). The transfusion raises the level of the red blood cells which carry oxygen, and this usually causes an energy increase.  The nurse administering the transfusion will monitor you carefully for complications. HOME CARE INSTRUCTIONS  No special instructions are needed after a transfusion. You may find your energy is better. Speak with  your caregiver about any limitations on activity for underlying diseases you may have. SEEK MEDICAL CARE IF:   Your condition is not improving after your transfusion.  You develop redness or irritation at the intravenous (IV) site. SEEK IMMEDIATE MEDICAL CARE IF:  Any of the following symptoms occur over the next 12 hours:  Shaking chills.  You have a temperature by mouth above 102 F (38.9 C), not controlled by medicine.  Chest, back, or muscle pain.  People around you feel you are not acting correctly or are confused.  Shortness of breath or difficulty breathing.  Dizziness and fainting.  You get a rash or develop hives.  You have a decrease in urine output.  Your urine turns a dark color or changes to pink, red, or brown. Any of the following symptoms occur over the next 10 days:  You have a temperature by mouth above 102 F (38.9 C), not controlled by medicine.  Shortness of breath.  Weakness after normal activity.  The white part of the eye turns yellow (jaundice).  You have a decrease in the amount of urine or are urinating less often.  Your urine turns a dark color or changes to pink, red, or brown. Document Released: 12/14/2000 Document Revised: 03/10/2012 Document Reviewed: 08/02/2008 ExitCare Patient Information 2014 [place].  _______________________________________________________________________  Incentive Spirometer  An incentive spirometer is a tool that can help keep your lungs clear and active. This tool measures how well you are filling your lungs with each breath. Taking long deep breaths may help reverse or decrease the chance of developing breathing (pulmonary) problems (especially infection) following:  A long period of time when you are unable to move or be active. BEFORE THE PROCEDURE   If the spirometer includes an indicator to show your best effort, your nurse or respiratory therapist will set it to a desired goal.  If possible,  sit up straight or lean slightly forward. Try not to slouch.  Hold the incentive spirometer in an upright position. INSTRUCTIONS FOR USE  1. Sit on the edge of your bed if possible, or sit up as far as you can in bed or on a chair. 2. Hold the incentive spirometer in an upright position. 3. Breathe out normally. 4. Place the mouthpiece in your mouth and seal your lips tightly around it. 5. Breathe in slowly and  as deeply as possible, raising the piston or the ball toward the top of the column. 6. Hold your breath for 3-5 seconds or for as long as possible. Allow the piston or ball to fall to the bottom of the column. 7. Remove the mouthpiece from your mouth and breathe out normally. 8. Rest for a few seconds and repeat Steps 1 through 7 at least 10 times every 1-2 hours when you are awake. Take your time and take a few normal breaths between deep breaths. 9. The spirometer may include an indicator to show your best effort. Use the indicator as a goal to work toward during each repetition. 10. After each set of 10 deep breaths, practice coughing to be sure your lungs are clear. If you have an incision (the cut made at the time of surgery), support your incision when coughing by placing a pillow or rolled up towels firmly against it. Once you are able to get out of bed, walk around indoors and cough well. You may stop using the incentive spirometer when instructed by your caregiver.  RISKS AND COMPLICATIONS  Take your time so you do not get dizzy or light-headed.  If you are in pain, you may need to take or ask for pain medication before doing incentive spirometry. It is harder to take a deep breath if you are having pain. AFTER USE  Rest and breathe slowly and easily.  It can be helpful to keep track of a log of your progress. Your caregiver can provide you with a simple table to help with this. If you are using the spirometer at home, follow these instructions: [place] IF:   You  are having difficultly using the spirometer.  You have trouble using the spirometer as often as instructed.  Your pain medication is not giving enough relief while using the spirometer.  You develop fever of 100.5 F (38.1 C) or higher. SEEK IMMEDIATE MEDICAL CARE IF:   You cough up bloody sputum that had not been present before.  You develop fever of 102 F (38.9 C) or greater.  You develop worsening pain at or near the incision site. MAKE SURE YOU:   Understand these instructions.  Will watch your condition.  Will get help right away if you are not doing well or get worse. Document Released: 04/29/2007 Document Revised: 03/10/2012 Document Reviewed: 06/30/2007 [facility] Patient Information 2014 [place], [place].   ________________________________________________________________________

## 2019-02-13 NOTE — Telephone Encounter (Signed)
I called Dr. Shelle Iron office about moving up the patient's cardiology appointment per Dr. Serita Grit request. I spoke with the scheduler in his office [place] and she states that Dr. Angelena Form and his nurse both are out of the office today and that she could send him a message stating the request.  She also would like for Dr. Denman George to send him a message.  I informed the scheduler that I would let Dr. Denman George know.

## 2019-02-13 NOTE — Telephone Encounter (Signed)
New Message         Dr. Denman George office is calling today to see if the patient could be see sooner that the 24? Pls call and advise.

## 2019-02-13 NOTE — Progress Notes (Signed)
Follow-up Note: Gyn-Onc  Consult was initially requested by Dr. Matthew Saras for the evaluation of [name] 85 y.o. female  CC:  Chief Complaint  Patient presents with  . Endometrial cancer [facility])    Assessment/Plan:  Ms. [name]  is a 85 y.o.  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   Uterine tumor:  I discussed the diagnosis with the patient and her family.  We discussed poor prognosis associated with this and potential treatment options.  These include 1/ hospice and palliative care 2/ chemotherapy with consideration for hysterectomy (ideally prior to chemotherapy) to control bleeding, or consideration for radiation to the uterus to control bleeding.   I discussed with the patient why surgery would not be curative.  I discussed that the role of surgery is purely for local control of symptoms particularly bleeding.  I explained that while her uterus remains in situ with this large burden of tumor within it, it is best for her to stay off her anticoagulant therapy as this will likely just cause heavy bleeding.  I discussed that surgery could potentially be performed with a minimally invasive total hysterectomy.  However, given her poor general health and performance status, such as surgery might be accompanied with a prolonged recovery and further deterioration in performance status.  This may delay her receiving any systemic cytotoxic therapy, and her metastatic sites may fluoresce during this recovery time. Palliative RT to the uterus to control bleeding may be one option that could avoid hysterectomy recovery but still achieve some local control of symptoms.   She is interested in meeting with Dr Alvy Bimler from med onc to discuss what chemotherapy would involve and if she is a candidate. She will then see me after this visit to further plan the course of treatment.    Thoracic  pain:  I do not see an oncologic source for this on PET scan. She is concerned it might be cardiac. She has an appointment with her cardiologist scheduled for the 24th. We will move this appointment up to next week given the progression of symptoms.   HPI: [name] is a 85 year old P4 who was seen in consultation at the request of Dr. Matthew Saras for uterine tumor.  The patient reports developing vaginal bleeding in January 2020.  Similar to a menstrual-like flow.  She takes Coumadin for atrial fibrillation and felt that this was the cause of it.  Over the subsequent months she developed passage of clots and tissue.  She was seen by Dr. Matthew Saras who performed an endometrial biopsy in the office on [name] 21st 2020 which revealed only blood clot and no endometrial tissue was identified.    A Ca1 25 was drawn on January 20, 2019 which was elevated at 73.7.  A transvaginal ultrasound scan was performed that same day which revealed a uterus that was enlarged measuring 11.3 x 5.7 x 8.3 cm.  The endometrium was significantly distended with blood clot.  No definite endometrial stripe was seen.  I 4.3 x 7.2 cm hyperechoic area was seen in the endometrial cavity extending into the cervix.  The ovaries were grossly normal bilaterally.    Given these findings a CT scan of the abdomen and pelvis was ordered and performed on January 30, 2019.  It revealed several renal cysts.  A 12 mm soft tissue density just superior to the celiac axis suspicious for mildly enlarged lymph nodes.  No other lymphadenopathy in the pelvis.  The uterus is enlarged with marked distention of the endometrial cavity by low-attenuation soft tissue.  No evidence of extrauterine extension and unremarkable adnexal regions.  Interval hx:  On February 03, 2027 a biopsy of the endometrium revealed high-grade malignancy consistent with a spindle cell neoplasm with an immunophenotype nonspecific but the differential includes carcinosarcoma and  leiomyosarcoma.  The latter was slightly favored.    A PET CT scan was performed on 02/11/19 which revealed an intensely hypermetabolic activity within the uterine bodyconsistent with uterine/endometrial carcinoma. Scattered small hypermetabolic nodular/nodal metastatic deposits above and below the diaphragm. Deposits include a RIGHT internal mammary node, epicardial node, gastrohepatic ligament node, lower abdomen ventral peritoneal node, and RIGHT inguinal node. No lung metastasis, liver metastasis or skeletal metastasis.  The patient reports a bracing pain across the upper thoracic spine. Possibly worse after eating. Episodic. Has had for >3 years. Progressive in intensity and frequency. Not helped with zantac. Not associated with SOB, chest pain, LOC, neuro symptoms.   Current Meds:  Outpatient Encounter Medications as of 02/13/2019  Medication Sig  . acetaminophen (TYLENOL) 325 MG tablet Take 325 mg by mouth every 6 (six) hours as needed for moderate pain or headache.  Marland Kitchen atorvastatin (LIPITOR) 10 MG tablet Take 10 mg by mouth Daily.   . furosemide (LASIX) 20 MG tablet Take 20 mg by mouth daily as needed for edema.   . gabapentin (NEURONTIN) 100 MG capsule Take 100 mg by mouth at bedtime.   Marland Kitchen levothyroxine (SYNTHROID, LEVOTHROID) 50 MCG tablet Take 50 mcg by mouth Daily.   Marland Kitchen lisinopril (PRINIVIL,ZESTRIL) 2.5 MG tablet Take 2.5 mg by mouth daily.  . meclizine (ANTIVERT) 25 MG tablet Take 25 mg by mouth 2 (two) times daily as needed for dizziness.   . metoprolol succinate (TOPROL XL) 25 MG 24 hr tablet Take 1 tablet (25 mg total) by mouth daily.  Marland Kitchen omeprazole (PRILOSEC) 20 MG capsule Take 20 mg by mouth daily.  . polyethylene glycol (MIRALAX / GLYCOLAX) packet Take 17 g by mouth daily as needed for moderate constipation.   . senna (SENOKOT) 8.6 MG TABS Take 1 tablet by mouth daily as needed for moderate constipation.   . traMADol (ULTRAM) 50 MG tablet Take 25 mg by mouth every 6 (six) hours as  needed for pain.  Marland Kitchen warfarin (COUMADIN) 3 MG tablet TAKE AS DIRECTED BY COUMADIN CLINIC (Patient taking differently: Take 1.5 mg by mouth at bedtime. )   No facility-administered encounter medications on file as of 02/13/2019.     Allergy:  Allergies  Allergen Reactions  . Adhesive [Tape] Rash  . Cardizem [Diltiazem Hcl] Other (See Comments) and Cough    Rash that caused dark spotting   . Latex Rash and Other (See Comments)    Per Patient Latex=lip swelling    Social Hx:   Social History   Socioeconomic History  . Marital status: Married    Spouse name: Not on file  . Number of children: Not on file  . Years of education: Not on file  . Highest education level: Not on file  Occupational History  . Occupation: RETIRED    Employer: [employer]  Social Needs  . Financial resource strain: Not on file  . Food insecurity:    Worry: Not on file    Inability: Not on file  . Transportation needs:    Medical: Not on file    Non-medical: Not on file  Tobacco Use  .  Smoking status: Never Smoker  . Smokeless tobacco: Never Used  Substance and Sexual Activity  . Alcohol use: No  . Drug use: No  . Sexual activity: Not on file  Lifestyle  . Physical activity:    Days per week: Not on file    Minutes per session: Not on file  . Stress: Not on file  Relationships  . Social connections:    Talks on phone: Not on file    Gets together: Not on file    Attends religious service: Not on file    Active member of club or organization: Not on file    Attends meetings of clubs or organizations: Not on file    Relationship status: Not on file  . Intimate partner violence:    Fear of current or ex partner: Not on file    Emotionally abused: Not on file    Physically abused: Not on file    Forced sexual activity: Not on file  Other Topics Concern  . Not on file  Social History Narrative  . Not on file    Past Surgical Hx:  Past Surgical History:  Procedure Laterality Date  .  BREAST EXCISIONAL BIOPSY Right 1991   benign  . BREAST SURGERY  1991   Rt br lump removed-benign  . COLON SURGERY  1996   CANCER REMOVAL  . GALLBLADDER SURGERY  2005  . LIVER BIOPSY  04/08/2012   Procedure: LIVER BIOPSY;  Surgeon: Adin Hector, MD;  Location: [place];  Service: General;;  laparoscopic  . TOTAL HIP ARTHROPLASTY  2004   RIGHT    Past Medical Hx:  Past Medical History:  Diagnosis Date  . Arthritis   . Arthritis pain   . Atrial fibrillation ([place])   . Cancer ([place])    colon, 1996  . Constipation   . Environmental allergies   . Hemochromatosis 1998  . History of colon cancer   . HTN (hypertension)   . Hx of echocardiogram    Echo (8/15):  EF 45-50%, diff HK, mild AI, MAC, mild to mod MR, severe LAE, mod RAE, PASP 32 mmHg  . PONV (postoperative nausea and vomiting)   . Thyroid disease   . Varicose vein of leg     Past Gynecological History:  See HPI No LMP recorded. Patient is postmenopausal.  Family Hx:  Family History  Problem Relation Age of Onset  . Heart disease Mother   . Stroke Mother   . Heart disease Father   . Pneumonia Brother   . Hypertension Maternal Grandfather   . Heart attack Neg Hx   . Breast cancer Neg Hx     Review of Systems:  Constitutional  Feels well,    ENT Normal appearing ears and nares bilaterally Skin/Breast  No rash, sores, jaundice, itching, dryness Cardiovascular  No chest pain, shortness of breath, or edema  Pulmonary  No cough or wheeze.  Gastro Intestinal  No nausea, vomitting, or diarrhoea. No bright red blood per rectum, no abdominal pain, change in bowel movement, or constipation.  Genito Urinary  No frequency, urgency, dysuria, + bleeding and passage of tissue (decreased since presentation).  Musculo Skeletal  No myalgia, arthralgia, joint swelling or pain  Neurologic  No weakness, numbness, change in gait,  Psychology  No depression, anxiety, insomnia.   Vitals:  Blood pressure (!) 146/83, pulse 68,  temperature 97.6 F (36.4 C), temperature source Oral, resp. rate 18, SpO2 100 %.  Physical Exam: WD in NAD  Neck  Supple NROM, without any enlargements.  Lymph Node Survey No cervical supraclavicular or inguinal adenopathy Cardiovascular  Pulse normal rate, regularity and rhythm. S1 and S2 normal.  Lungs  Clear to auscultation bilateraly, without wheezes/crackles/rhonchi. Good air movement.  Skin  No rash/lesions/breakdown  Psychiatry  Alert and oriented to person, place, and time  Abdomen  Normoactive bowel sounds, abdomen soft, non-tender and nonobese without evidence of hernia.  Back No CVA tenderness Genito Urinary  deferred Rectal  deferred  Extremities  No bilateral cyanosis, clubbing or edema.     Thereasa Solo, MD  02/13/2019, 4:58 PM

## 2019-02-16 ENCOUNTER — Telehealth: Payer: Self-pay | Admitting: *Deleted

## 2019-02-16 ENCOUNTER — Inpatient Hospital Stay (HOSPITAL_COMMUNITY)
Admission: RE | Admit: 2019-02-16 | Discharge: 2019-02-16 | Disposition: A | Discharge status: Home / Self Care | Payer: Medicare Other | Source: Ambulatory Visit

## 2019-02-16 ENCOUNTER — Telehealth: Payer: Self-pay | Admitting: Oncology

## 2019-02-16 NOTE — Telephone Encounter (Signed)
Called and spoke with the patient's daughter Neoma Laming. Notified Neoma Laming that her paperwork was ready to be picked up at the front desk.

## 2019-02-16 NOTE — Telephone Encounter (Signed)
I called Dr. Camillia Herter office to follow-up on moving the patient's cardiologist appointment up at the request of Dr. Denman George.  I spoke with Altha Harm from scheduling and she states that Dr. Camillia Herter nurse Legrand Como will give our office a call back later today to see if the office can possibly work [name] in on February 19th.

## 2019-02-16 NOTE — Telephone Encounter (Signed)
Called patient's daughter, Marlowe Kays, and asked if they would be able to come in at 1 pm instead of 2 pm tomorrow to see Dr. Alvy Bimler.  Marlowe Kays said she would like to keep the apt at 2 pm because she is not able to leave work earlier.

## 2019-02-16 NOTE — Telephone Encounter (Signed)
Follow up    Edd Fabian is calling in reference to previous message and is wanting to know when the patient can be setup on 02/18/19? Edd Fabian states that she will be available until 12 pm today after that time please contact Louise at the same phone #.

## 2019-02-16 NOTE — Telephone Encounter (Signed)
Made patient an appointment with Ermalinda Barrios PA on 02/18/19. Tried to reach patient to, no answer and unable to leave voicemail. Will try to call patient again tomorrow.

## 2019-02-17 ENCOUNTER — Encounter (HOSPITAL_COMMUNITY): Admission: RE | Payer: Self-pay | Source: Home / Self Care

## 2019-02-17 ENCOUNTER — Telehealth: Payer: Self-pay | Admitting: Oncology

## 2019-02-17 ENCOUNTER — Encounter: Payer: Self-pay | Admitting: Physician Assistant

## 2019-02-17 ENCOUNTER — Ambulatory Visit (HOSPITAL_COMMUNITY): Admission: RE | Admit: 2019-02-17 | Payer: Medicare Other | Source: Home / Self Care | Admitting: Gynecologic Oncology

## 2019-02-17 ENCOUNTER — Inpatient Hospital Stay (HOSPITAL_BASED_OUTPATIENT_CLINIC_OR_DEPARTMENT_OTHER): Payer: Medicare Other | Admitting: Hematology and Oncology

## 2019-02-17 ENCOUNTER — Encounter: Payer: Self-pay | Admitting: Hematology and Oncology

## 2019-02-17 DIAGNOSIS — Z85038 Personal history of other malignant neoplasm of large intestine: Secondary | ICD-10-CM

## 2019-02-17 DIAGNOSIS — C7989 Secondary malignant neoplasm of other specified sites: Secondary | ICD-10-CM

## 2019-02-17 DIAGNOSIS — C549 Malignant neoplasm of corpus uteri, unspecified: Secondary | ICD-10-CM

## 2019-02-17 DIAGNOSIS — M546 Pain in thoracic spine: Secondary | ICD-10-CM

## 2019-02-17 DIAGNOSIS — Z7189 Other specified counseling: Secondary | ICD-10-CM

## 2019-02-17 DIAGNOSIS — M549 Dorsalgia, unspecified: Secondary | ICD-10-CM | POA: Insufficient documentation

## 2019-02-17 DIAGNOSIS — C541 Malignant neoplasm of endometrium: Secondary | ICD-10-CM | POA: Diagnosis not present

## 2019-02-17 DIAGNOSIS — I482 Chronic atrial fibrillation, unspecified: Secondary | ICD-10-CM

## 2019-02-17 SURGERY — HYSTERECTOMY, TOTAL, ROBOT-ASSISTED, LAPAROSCOPIC, WITH BILATERAL SALPINGO-OOPHORECTOMY
Anesthesia: General

## 2019-02-17 NOTE — Progress Notes (Signed)
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  We discussed the general approach to uterine sarcoma.   We discussed the risk and benefit of palliative hysterectomy to stop the uterine bleeding.   I wouls be concerned about risk of congestive heart failure and poor wound healing and recovery from surgery given her age and medical co-morbidities She has appointment to see GYN surgeon again at the end of the week if she would like to know more about the risk and benefits of surgery We also discussed the role of palliative radiation therapy to stop uterine bleeding. In terms of palliative systemic chemotherapy, in general, sarcoma regimen is typically more aggressive and harder to tolerate.   With her frail status, I am concerned about significant toxicity of treatment The patient is at peace of not pursuing chemotherapy.   Chronic atrial fibrillation We spent quite a bit of time discussing about the risk and benefits of anticoagulation therapy versus no anticoagulation therapy With her untreated cancer and chronic atrial fibrillation, she would be at risk of more blood clots and stroke On the other hand, if we resume her anticoagulation treatment, she could be at risk of excessive uterine bleeding causing severe anemia She has appointment to meet with her cardiologist tomorrow to discuss this A safer alternative, even though not perfect, would be aspirin therapy.  History of  colon cancer She has history of colon cancer diagnosed almost 30 years ago.  We do not have records related to this.  She never received adjuvant treatment We discussed the risk and benefits of genetics counseling and genetic testing.  She is undecided.  She will think about it  Acute back pain She has intermittent upper back pain of unknown etiology, sometimes aggravated by eating and sometimes unrelated. She describe it as squeezing pain that lasts is 30 minutes, recently resolved with tramadol. It could be related to angina symptoms or intestinal ischemia related to food For now, I recommend continue on conservative management with tramadol as needed  Goals of care, counseling/discussion We have extensive goals of care discussion today. At the end of today's visit, the patient has made informed decision not to pursue palliative treatment We discussed CODE STATUS.  She desire to be DNR She is not interested to pursue her kidney dialysis or feeding tube in the future in the event she developed multiorgan failure I recommend palliative care/hospice referral.  The patient is undecided. I recommend follow-up appointment to see her primary care doctor for further discussion.   No orders of the defined types were placed in this encounter.    CHIEF COMPLAINTS/PURPOSE OF CONSULTATION:  Metastatic uterine sarcoma, for further management  HISTORY OF PRESENTING ILLNESS:  [name] 85 y.o. female is here because of recent diagnosis of uterine sarcoma. 6 other family members were present. The patient is a retired Optometrist.  She lives with her husband with dementia. Approximately 6 months ago, she started to have decline in performance status with intermittent pain,  anorexia and approximately 15 pound weight loss.   She fell around October of last year and had pneumonia in the fall. The patient is on chronic anticoagulation therapy due to chronic atrial fibrillation.  She was noted to have  history of cardiomyopathy with recovery of cardiac function based on echocardiogram from 2019. Over the last few months, she started to have intermittent upper back pain, squeezing in nature, usually lasting 30 minutes, usually after eating or drinking cold water.  She feels hot and clammy with each episode. She was told that she might have heartburn and was prescribed Zantac that seems to help.  She was recently also prescribed tramadol for intermittent pain. She have chronic mild bilateral lower extremity edema. She started to have significant postmenopausal bleeding leading to multiple investigation and subsequent diagnosis of uterine cancer.  I have reviewed her chart and materials related to her cancer extensively and collaborated history with the patient. Summary of oncologic history is as follows:   Uterine sarcoma ([place])   01/20/2019 Tumor Marker    Patient's tumor was tested for the following markers: CA-125. Results of the tumor marker test revealed 73.7    01/30/2019 Imaging    1. Enlarged uterus with large amount of soft tissue density distending the endometrial cavity, consistent with endometrial carcinoma. 2. Mildly enlarged celiac axis lymph node. Metastatic disease cannot be excluded. No lymphadenopathy elsewhere within the abdomen pelvis. Consider PET-CT scan for further evaluation. 3. Prior cholecystectomy. Diffuse biliary ductal dilatation, with multiple filling defects within the distal common bile duct, consistent with choledocholithiasis.  4. Nonobstructing left renal calculus. No evidence of ureteral calculi or hydronephrosis.    02/03/2019 Pathology Results    1. Endometrium, biopsy - HIGH GRADE MALIGNANCY - SEE COMMENT 2. Endometrium, resection - HIGH GRADE MALIGNANCY - SEE COMMENT Microscopic Comment 1. and 2. The biopsy and tissue resection shows an pleomorphic spindle cell neoplasm which is positive for desmin, cyclin-D1, and weakly positive for cytokeratin 8/18 but  negative for SMA, EMA, cytokeratin AE1/3, and cytokeratin 5/6. The immunophenotype is non-specific and the differential includes a carcinosarcoma and leiomyosarcoma. The latter is slightly favored.    02/03/2019 Procedure    Procedure: endometrial biopsy Surgeon: Dorann Ou, MD  Specimens: endometrial biopsy, endometrial tissue  Procedure Details: The patient provided verbal consent and verbal timeout was performed.  The large endometrial tissue fragments that had been delivered from the vagina was sent for permanent pathology.  The speculum was placed in the vagina and the cervix was visualized it was widely dilated to approximately 3 cm.  The cervix itself was otherwise normal-appearing the endometrial Pipelle was inserted to a depth of 7 cm where it met firm resistance.  Aspiration took place but this appeared to be mostly blood.  An endocervical curette was introduced to gently scrape from the level of the obstruction and this was sent as the same specimen.  Monsel solution was applied to the dilated cervix and the bleeding bed of tumor.  This was grossly hemostatic at the completion of the procedure.  The specimens were sent for histopathology.  Patient tolerated procedure well.    02/11/2019 PET scan    1. Intensely hypermetabolic activity within the uterine bodyconsistent with uterine/endometrial carcinoma. 2. Scattered small hypermetabolic nodular/nodal metastatic deposits above and below the diaphragm. Deposits include a RIGHT internal mammary node, epicardial node, gastrohepatic ligament node, lower abdomen ventral peritoneal node, and RIGHT inguinal node. 3. No lung metastasis, liver metastasis or skeletal metastasis.  02/17/2019 Cancer Staging    Staging form: Corpus Uteri - Leiomyosarcoma and Endometrial Stromal Sarcoma, AJCC 8th Edition - Clinical stage from 02/17/2019: Stage IVB (cTX, cN1, cM1) - Signed by Heath Lark, MD on 02/17/2019    She denies recent shortness of breath.  No  cough.  Denies fever or chills. She have mild leg swelling. Since her recent biopsy, she have mild vaginal discharge only but she needs to wear a pad all the time She has declining performance status with more weakness and reduced stamina. Multiple family members help with cooking, grocery shopping and cleaning. She is still independent with self-care such as feeding herself or taking a shower. She denies recent falls. She has remote history of colon cancer diagnosed almost 30 years ago treated with surgery only.  MEDICAL HISTORY:  Past Medical History:  Diagnosis Date  . Arthritis   . Arthritis pain   . Atrial fibrillation ([place])   . Cancer ([place])    colon, 1996  . Constipation   . Environmental allergies   . Hemochromatosis 1998  . History of colon cancer   . HTN (hypertension)   . Hx of echocardiogram    Echo (8/15):  EF 45-50%, diff HK, mild AI, MAC, mild to mod MR, severe LAE, mod RAE, PASP 32 mmHg  . PONV (postoperative nausea and vomiting)   . Thyroid disease   . Varicose vein of leg     SURGICAL HISTORY: Past Surgical History:  Procedure Laterality Date  . BREAST EXCISIONAL BIOPSY Right 1991   benign  . BREAST SURGERY  1991   Rt br lump removed-benign  . COLON SURGERY  1996   CANCER REMOVAL  . GALLBLADDER SURGERY  2005  . LIVER BIOPSY  04/08/2012   Procedure: LIVER BIOPSY;  Surgeon: Adin Hector, MD;  Location: [place];  Service: General;;  laparoscopic  . TOTAL HIP ARTHROPLASTY  2004   RIGHT    SOCIAL HISTORY: Social History   Socioeconomic History  . Marital status: Married    Spouse name: Not on file  . Number of children: Not on file  . Years of education: Not on file  . Highest education level: Not on file  Occupational History  . Occupation: RETIRED    Employer: [employer]  Social Needs  . Financial resource strain: Not on file  . Food insecurity:    Worry: Not on file    Inability: Not on file  . Transportation needs:    Medical: Not on  file    Non-medical: Not on file  Tobacco Use  . Smoking status: Never Smoker  . Smokeless tobacco: Never Used  Substance and Sexual Activity  . Alcohol use: No  . Drug use: No  . Sexual activity: Not on file  Lifestyle  . Physical activity:    Days per week: Not on file    Minutes per session: Not on file  . Stress: Not on file  Relationships  . Social connections:    Talks on phone: Not on file    Gets together: Not on file    Attends religious service: Not on file    Active member of club or organization: Not on file    Attends meetings of clubs or organizations: Not on file    Relationship status: Not on file  . Intimate partner violence:    Fear of current or ex partner: Not on file    Emotionally abused: Not on file    Physically abused: Not  on file    Forced sexual activity: Not on file  Other Topics Concern  . Not on file  Social History Narrative  . Not on file    FAMILY HISTORY: Family History  Problem Relation Age of Onset  . Heart disease Mother   . Stroke Mother   . Heart disease Father   . Pneumonia Brother   . Hypertension Maternal Grandfather   . Heart attack Neg Hx   . Breast cancer Neg Hx     ALLERGIES:  is allergic to adhesive [tape]; cardizem [diltiazem hcl]; and latex.  MEDICATIONS:  Current Outpatient Medications  Medication Sig Dispense Refill  . acetaminophen (TYLENOL) 325 MG tablet Take 325 mg by mouth every 6 (six) hours as needed for moderate pain or headache.    Marland Kitchen atorvastatin (LIPITOR) 10 MG tablet Take 10 mg by mouth Daily.     . furosemide (LASIX) 20 MG tablet Take 20 mg by mouth daily as needed for edema.     . gabapentin (NEURONTIN) 100 MG capsule Take 100 mg by mouth at bedtime.     Marland Kitchen levothyroxine (SYNTHROID, LEVOTHROID) 50 MCG tablet Take 50 mcg by mouth Daily.     Marland Kitchen lisinopril (PRINIVIL,ZESTRIL) 2.5 MG tablet Take 2.5 mg by mouth daily.  2  . meclizine (ANTIVERT) 25 MG tablet Take 25 mg by mouth 2 (two) times daily as needed  for dizziness.     . metoprolol succinate (TOPROL XL) 25 MG 24 hr tablet Take 1 tablet (25 mg total) by mouth daily. 90 tablet 3  . omeprazole (PRILOSEC) 20 MG capsule Take 20 mg by mouth daily.    . polyethylene glycol (MIRALAX / GLYCOLAX) packet Take 17 g by mouth daily as needed for moderate constipation.     . senna (SENOKOT) 8.6 MG TABS Take 1 tablet by mouth daily as needed for moderate constipation.     . traMADol (ULTRAM) 50 MG tablet Take 25 mg by mouth every 6 (six) hours as needed for pain.    Marland Kitchen warfarin (COUMADIN) 3 MG tablet TAKE AS DIRECTED BY COUMADIN CLINIC (Patient taking differently: Take 1.5 mg by mouth at bedtime. ) 80 tablet 1   No current facility-administered medications for this visit.     REVIEW OF SYSTEMS:   Constitutional: Denies fevers, chills or abnormal night sweats Eyes: Denies blurriness of vision, double vision or watery eyes Ears, nose, mouth, throat, and face: Denies mucositis or sore throat Respiratory: Denies cough, dyspnea or wheezes Cardiovascular: Denies palpitation, chest discomfort  Gastrointestinal:  Denies nausea, heartburn or change in bowel habits Skin: Denies abnormal skin rashes Lymphatics: Denies new lymphadenopathy or easy bruising Neurological:Denies numbness, tingling Behavioral/Psych: Mood is stable, no new changes  All other systems were reviewed with the patient and are negative.  PHYSICAL EXAMINATION: ECOG PERFORMANCE STATUS: 2 - Symptomatic, <50% confined to bed  Vitals:   02/17/19 1420  BP: 119/68  Pulse: 95  Resp: 18  Temp: (!) 97.4 F (36.3 C)  SpO2: 97%   Filed Weights   02/17/19 1420  Weight: 167 lb 6.4 oz (75.9 kg)    GENERAL:alert, no distress and comfortable.  She is sitting on the wheelchair.  I was not able to perform examination as majority of the time is consumed with discussion about plan of care PSYCH: alert & oriented x 3 with fluent speech   LABORATORY DATA:  I have reviewed the data as listed Lab  Results  Component Value Date   WBC 8.7 09/15/2018  HGB 12.7 09/15/2018   HCT 39.1 09/15/2018   MCV 101.8 (H) 09/15/2018   PLT 131 (L) 09/15/2018   Recent Labs    09/14/18 1355 09/15/18 0726  NA 143 142  K 3.6 3.5  CL 108 108  CO2 28 25  GLUCOSE 100* 79  BUN 20 17  CREATININE 0.55 0.54  CALCIUM 9.7 9.0  GFRNONAA >60 >60  GFRAA >60 >60  PROT  --  5.5*  ALBUMIN  --  3.1*  AST  --  18  ALT  --  13  ALKPHOS  --  64  BILITOT  --  3.4*    RADIOGRAPHIC STUDIES: I have reviewed multiple imaging studies with the patient and family I have personally reviewed the radiological images as listed and agreed with the findings in the report. Ct Abdomen Pelvis W Contrast  Result Date: 01/30/2019 CLINICAL DATA:  Postmenopausal bleeding and cramping. Enlarged uterus. Endometrial carcinoma. Personal history of colon carcinoma. Creatinine was obtained on site at [place] at [address] Results: Creatinine 0.7 mg/dL. EXAM: CT ABDOMEN AND PELVIS WITH CONTRAST TECHNIQUE: Multidetector CT imaging of the abdomen and pelvis was performed using the standard protocol following bolus administration of intravenous contrast. CONTRAST:  181m ISOVUE-300 IOPAMIDOL (ISOVUE-300) INJECTION 61% [facility] Lower Chest: No acute findings. Hepatobiliary: Several hepatic cysts are seen, and these have decreased in size since previous study. No liver masses identified. Prior cholecystectomy. Diffuse biliary ductal dilatation is seen, with common bile duct measuring approximately 16 mm in diameter. Multiple filling defects are seen within the distal common bile duct, consistent with choledocholithiasis. Pancreas: No mass or inflammatory changes. No evidence of pancreatic ductal dilatation. Spleen: Within normal limits in size and appearance. Adrenals/Urinary Tract: Normal adrenal glands. Several renal cysts are seen, largest in the right kidney measuring 11 cm.  No renal masses are identified. A 6 mm calculus is seen in the midpole of the left kidney, however there is no evidence of ureteral calculi or hydronephrosis. Unremarkable unopacified urinary bladder. Stomach/Bowel: No evidence of obstruction, inflammatory process or abnormal fluid collections. Vascular/Lymphatic: 12 mm soft tissue density just superior to the celiac axis is suspicious for a mildly enlarged lymph nodes. No other pathologically enlarged lymph nodes are identified within the abdomen or pelvis. Reproductive: The uterus is enlarged with marked distention of the endometrial cavity by low-attenuation soft tissue. This is consistent with endometrial carcinoma. No evidence of extra uterine extension. Adnexal regions are unremarkable in appearance. Other:  None. Musculoskeletal: No suspicious bone lesions identified. Left hip prosthesis noted. IMPRESSION: 1. Enlarged uterus with large amount of soft tissue density distending the endometrial cavity, consistent with endometrial carcinoma. 2. Mildly enlarged celiac axis lymph node. Metastatic disease cannot be excluded. No lymphadenopathy elsewhere within the abdomen pelvis. Consider PET-CT scan for further evaluation. 3. Prior cholecystectomy. Diffuse biliary ductal dilatation, with multiple filling defects within the distal common bile duct, consistent with choledocholithiasis. 4. Nonobstructing left renal calculus. No evidence of ureteral calculi or hydronephrosis. Electronically Signed   By: JEarle GellM.D.   On: 01/30/2019 17:10   Nm Pet Image Initial (pi) Skull Base To Thigh  Result Date: 02/11/2019 CLINICAL DATA:  Initial treatment strategy for endometrial carcinoma. EXAM: NUCLEAR MEDICINE PET SKULL BASE TO THIGH TECHNIQUE: 9.8 mCi F-18 FDG was injected intravenously. Full-ring PET imaging was performed from the skull base to thigh after the radiotracer. CT data was obtained and used for attenuation correction and anatomic localization.  Fasting  blood glucose: 71 mg/dl COMPARISON:  CT 01/30/2019 FINDINGS: Mediastinal blood pool activity: SUV max 2.74 NECK: No hypermetabolic lymph nodes in the neck. Incidental CT findings: none CHEST: Hypermetabolic RIGHT internal mammary node SUV max equal 5.8. This small node measures 8 mm (image 62/4) Nodular implant in the epicardial fat anterior to the liver measuring 12 mm (image 19/4) with SUV max equal 6.8. Incidental CT findings: No suspicious pulmonary nodules. ABDOMEN/PELVIS: Low-density lesions in liver without focal metabolic activity. Hypermetabolic lymph node at the crus of the diaphragm adjacent to gastrohepatic ligament measuring 13 mm (image 105/4) with SUV max equal 17.1 (image 105 of fused data set). Peritoneal nodule in the ventral peritoneal space of the lower abdomen measuring 10 mm (image 164/4) with SUV max equal 9.3. Hypermetabolic RIGHT inguinal lymph node (11 mm, image 172/4) with SUV max equal 18.4. Intense hypermetabolic activity within the uterine body with SUV max equal 22. Incidental CT findings: none SKELETON: No focal hypermetabolic activity to suggest skeletal metastasis. Incidental CT findings: none IMPRESSION: 1. Intensely hypermetabolic activity within the uterine bodyconsistent with uterine/endometrial carcinoma. 2. Scattered small hypermetabolic nodular/nodal metastatic deposits above and below the diaphragm. Deposits include a RIGHT internal mammary node, epicardial node, gastrohepatic ligament node, lower abdomen ventral peritoneal node, and RIGHT inguinal node. 3. No lung metastasis, liver metastasis or skeletal metastasis. Electronically Signed   By: Suzy Bouchard M.D.   On: 02/11/2019 16:38    I spent 80 minutes counseling the patient face to face. The total time spent in the appointment was 90 minutes and more than 50% was on counseling.  All questions were answered. The patient knows to call the clinic with any problems, questions or concerns.  Heath Lark,  MD 02/17/2019 4:50 PM

## 2019-02-17 NOTE — Assessment & Plan Note (Addendum)
I have reviewed multiple imaging studies with the patient and family members The patient have extensive stage IV cancer with metastatic disease in her chest I do not recommend biopsy of lesions in her chest to prove that the disease is related to the uterine cancer I also gave her copies of PET CT scan and biopsy report from uterine biopsy. 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  We discussed the general approach to uterine sarcoma.   We discussed the risk and benefit of palliative hysterectomy to stop the uterine bleeding.   I wouls be concerned about risk of congestive heart failure and poor wound healing and recovery from surgery given her age and medical co-morbidities She has appointment to see GYN surgeon again at the end of the week if she would like to know more about the risk and benefits of surgery We also discussed the role of palliative radiation therapy to stop uterine bleeding. In terms of palliative systemic chemotherapy, in general, sarcoma regimen is typically more aggressive and harder to tolerate.   With her frail status, I am concerned about significant toxicity of treatment The patient is at peace of not pursuing chemotherapy.

## 2019-02-17 NOTE — Assessment & Plan Note (Signed)
She has intermittent upper back pain of unknown etiology, sometimes aggravated by eating and sometimes unrelated. She describe it as squeezing pain that lasts is 30 minutes, recently resolved with tramadol. It could be related to angina symptoms or intestinal ischemia related to food For now, I recommend continue on conservative management with tramadol as needed

## 2019-02-17 NOTE — Progress Notes (Signed)
Cardiology Office Note    Date:  02/18/2019   ID:  [name], DOB [birth year], MRN [id number]  PCP:  Lorene Dy, MD  Cardiologist: Lauree Chandler, MD EPS: None  Chief Complaint  Patient presents with  . Follow-up    History of Present Illness:  [name] is a 85 y.o. female with history of PAF CHA2DS2-VASc equals 4 on Coumadin, hypertension, chronic diastolic CHF HLD, hemochromatosis, moderate MR.  Patient last saw Dr. Angelena Form 01/13/2018 at which time she was in atrial fibrillation with rate control.  Continued on Toprol and Coumadin.  Off Cardizem because of rash.  Patient called in with vaginal bleeding and needs to hold her Coumadin.  INRs have been stable and therapeutic in the 2.0 range.  She has had no high readings.  Patient had significant vaginal bleeding and off Coumadin she still is having brown discharge.  Patient was found to have extensive stage IV uterine cancer with metastatic disease in her chest. Dr. Alvy Bimler did not recommend biopsy of lesions in her chest to prove that the disease is related to her uterine cancer.  He recommended palliative care as he felt she was too high risk for surgery given her cardiac history age and comorbidities.  He also  had a long discussion with the patient discussing the benefits and risks of anticoagulation therapy versus no anticoagulation.  With her untreated cancer and chronic atrial fibrillation she is at risk of more blood clots and stroke but with resuming anticoagulation she is at risk of excessive uterine bleeding that could lead to severe anemia.  He prefers aspirin therapy if indicated.  CHADSVASC puts her at 4% risk of stroke yearly. HAS-BLED score is 3 for HTN, prior bleeding and age is 5.8% risk of bleeding which is slightly higher risk than stroke.   Patient comes in accompanied by her 2 daughters. Complains of upper back pain usually after she eats and lasts about 30 min. Eating less and Zantac helped. Only one  episode since stopped drinking Dr. Malachi Bonds.  They are concerned this could be angina after reading on the Internet.  She has no exertional symptoms or associated shortness of breath.   Past Medical History:  Diagnosis Date  . Arthritis   . Arthritis pain   . Atrial fibrillation ([place])   . Cancer ([place])    colon, 1996  . Constipation   . Environmental allergies   . Hemochromatosis 1998  . History of colon cancer   . HTN (hypertension)   . Hx of echocardiogram    Echo (8/15):  EF 45-50%, diff HK, mild AI, MAC, mild to mod MR, severe LAE, mod RAE, PASP 32 mmHg  . PONV (postoperative nausea and vomiting)   . Thyroid disease   . Varicose vein of leg     Past Surgical History:  Procedure Laterality Date  . BREAST EXCISIONAL BIOPSY Right 1991   benign  . BREAST SURGERY  1991   Rt br lump removed-benign  . COLON SURGERY  1996   CANCER REMOVAL  . GALLBLADDER SURGERY  2005  . LIVER BIOPSY  04/08/2012   Procedure: LIVER BIOPSY;  Surgeon: Adin Hector, MD;  Location: [facility];  Service: General;;  laparoscopic  . TOTAL HIP ARTHROPLASTY  2004   RIGHT    Current Medications: Current Meds  Medication Sig  . acetaminophen (TYLENOL) 325 MG tablet Take 325 mg by mouth every 6 (six) hours as needed for moderate pain or headache.  Marland Kitchen atorvastatin (  LIPITOR) 10 MG tablet Take 10 mg by mouth Daily.   . furosemide (LASIX) 20 MG tablet Take 20 mg by mouth daily as needed for edema.   . gabapentin (NEURONTIN) 100 MG capsule Take 100 mg by mouth at bedtime.   Marland Kitchen levothyroxine (SYNTHROID, LEVOTHROID) 50 MCG tablet Take 50 mcg by mouth Daily.   Marland Kitchen lisinopril (PRINIVIL,ZESTRIL) 2.5 MG tablet Take 2.5 mg by mouth daily.  . meclizine (ANTIVERT) 25 MG tablet Take 25 mg by mouth 2 (two) times daily as needed for dizziness.   . metoprolol succinate (TOPROL XL) 25 MG 24 hr tablet Take 1 tablet (25 mg total) by mouth daily.  Marland Kitchen omeprazole (PRILOSEC) 20 MG capsule Take 20 mg by mouth daily.  . polyethylene glycol  (MIRALAX / GLYCOLAX) packet Take 17 g by mouth daily as needed for moderate constipation.   . senna (SENOKOT) 8.6 MG TABS Take 1 tablet by mouth daily as needed for moderate constipation.   . traMADol (ULTRAM) 50 MG tablet Take 25 mg by mouth every 6 (six) hours as needed for pain.  Marland Kitchen warfarin (COUMADIN) 3 MG tablet TAKE AS DIRECTED BY COUMADIN CLINIC (Patient taking differently: Take 1.5 mg by mouth at bedtime. )     Allergies:   Adhesive [tape]; Cardizem [diltiazem hcl]; and Latex   Social History   Socioeconomic History  . Marital status: Married    Spouse name: Not on file  . Number of children: Not on file  . Years of education: Not on file  . Highest education level: Not on file  Occupational History  . Occupation: RETIRED    Employer: [employer]  Social Needs  . Financial resource strain: Not on file  . Food insecurity:    Worry: Not on file    Inability: Not on file  . Transportation needs:    Medical: Not on file    Non-medical: Not on file  Tobacco Use  . Smoking status: Never Smoker  . Smokeless tobacco: Never Used  Substance and Sexual Activity  . Alcohol use: No  . Drug use: No  . Sexual activity: Not on file  Lifestyle  . Physical activity:    Days per week: Not on file    Minutes per session: Not on file  . Stress: Not on file  Relationships  . Social connections:    Talks on phone: Not on file    Gets together: Not on file    Attends religious service: Not on file    Active member of club or organization: Not on file    Attends meetings of clubs or organizations: Not on file    Relationship status: Not on file  Other Topics Concern  . Not on file  Social History Narrative  . Not on file     Family History:  The patient's family history includes Heart disease in her father and mother; Hypertension in her maternal grandfather; Pneumonia in her brother; Stroke in her mother.   ROS:   Please see the history of present illness.    Review of Systems   Constitution: Positive for weight loss.  HENT: Positive for hearing loss.   Eyes: Negative.   Cardiovascular: Positive for leg swelling.  Respiratory: Negative.   Hematologic/Lymphatic: Positive for bleeding problem.  Musculoskeletal: Positive for back pain. Negative for joint pain.  Gastrointestinal: Negative.   Genitourinary: Negative.   Neurological: Negative.    All other systems reviewed and are negative.   PHYSICAL EXAM:   VS:  BP 130/72   Pulse 80   Ht 5' 3"  (1.6 m)   Wt 165 lb 12.8 oz (75.2 kg)   SpO2 98%   BMI 29.37 kg/m   Physical Exam  GEN: Well nourished, well developed, in no acute distress  Neck: no JVD, carotid bruits, or masses Cardiac:RRR; no murmurs, rubs, or gallops  Respiratory:  clear to auscultation bilaterally, normal work of breathing GI: soft, nontender, nondistended, + BS Ext: without cyanosis, clubbing, or edema, Good distal pulses bilaterally Neuro:  Alert and Oriented x 3 Psych: euthymic mood, full affect  Wt Readings from Last 3 Encounters:  02/18/19 165 lb 12.8 oz (75.2 kg)  02/17/19 167 lb 6.4 oz (75.9 kg)  02/03/19 168 lb (76.2 kg)      Studies/Labs Reviewed:   EKG:  EKG is  ordered today.  The ekg ordered today demonstrates atrial fibrillation at 82 bpm with poor R wave progression anteriorly, no change from prior EKG  Recent Labs: 09/15/2018: ALT 13; BUN 17; Creatinine, Ser 0.54; Hemoglobin 12.7; Platelets 131; Potassium 3.5; Sodium 142   Lipid Panel    Component Value Date/Time   CHOL 153 08/29/2011 0235   TRIG 80 08/29/2011 0235   HDL 64 08/29/2011 0235   CHOLHDL 2.4 08/29/2011 0235   VLDL 16 08/29/2011 0235   LDLCALC 73 08/29/2011 0235    Additional studies/ records that were reviewed today include:  Echo 7/2/19Study Conclusions   - Left ventricle: The cavity size was normal. Wall thickness was   increased in a pattern of mild LVH. Systolic function was normal.   The estimated ejection fraction was in the range of 50%  to 55%.   Wall motion was normal; there were no regional wall motion   abnormalities. - Aortic valve: There was mild regurgitation. - Mitral valve: There was mild to moderate regurgitation. - Left atrium: The atrium was moderately dilated. - Right ventricle: The cavity size was mildly dilated. Systolic   function was mildly reduced. - Right atrium: The atrium was severely dilated. - Tricuspid valve: There was moderate regurgitation. - Pulmonary arteries: Systolic pressure was moderately increased.   PA peak pressure: 47 mm Hg (S).     ASSESSMENT:    1. Atrial fibrillation, unspecified type ([place])   2. Long term (current) use of anticoagulants   3. Acute midline thoracic back pain   4. Goals of care, counseling/discussion      PLAN:  In order of problems listed above:  Chronic atrial fibrillation Chads vas equals 4 she has at 4% risk of stroke yearly and according to HAS-Bled score she is at 5.8% risk of bleeding with her recent vaginal bleed secondary to stage IV uterine cancer.  Discussed risks and benefits of long-term use of anticoagulation with [name] in pharmacy, patient and 2 daughters in detail.  She is still having brown discharge off Coumadin and had a significant bleed with a therapeutic INR.  At this point given her age, stage IV uterine cancer and comorbidities it is best to leave her off of Coumadin.  We will asked Dr. Angelena Form to weigh in.  At this point no indication for aspirin.  Back pain aggravated by eating and relieved with Zantac.  Family is concerned this could be anginal equivalent.  She has no history of coronary disease but it certainly could be.  They like to try low-dose Imdur to see if it helps.  We will give her 15 mg daily.  If it does not help I have  asked her to stop.  Given her stage IV cancer and decision not to pursue surgery we will not pursue ischemic work-up.  Goals of care, counseling and discussion made.  Spent 1 hour face-to-face time with  patient and daughters  Medication Adjustments/Labs and Tests Ordered: Current medicines are reviewed at length with the patient today.  Concerns regarding medicines are outlined above.  Medication changes, Labs and Tests ordered today are listed in the Patient Instructions below. Patient Instructions  Medication Instructions:  Your physician has recommended you make the following change in your medication:   START: isosorbide mononitrate (imdur) 30 mg tablet: Take 1/2 tablet (15 mg total) by mouth once a day  Lab work: None Ordered  If you have labs (blood work) drawn today and your tests are completely normal, you will receive your results only by: Marland Kitchen MyChart Message (if you have MyChart) OR . A paper copy in the mail If you have any lab test that is abnormal or we need to change your treatment, we will call you to review the results.  Testing/Procedures: None ordered  Follow-Up: At [facility] you and your health needs are our priority.  As part of our continuing mission to provide you with exceptional heart care, we have created designated Provider Care Teams.  These Care Teams include your primary Cardiologist (physician) and Advanced Practice Providers (APPs -  Physician Assistants and Nurse Practitioners) who all work together to provide you with the care you need, when you need it. . You will need a follow up appointment in 4-6 months.  Please call our office 2 months in advance to schedule this appointment.  You may see Darlina Guys, MD or one of the following Advanced Practice Providers on your designated Care Team:   . Lyda Jester, PA-C . Dayna Dunn, PA-C . Ermalinda Barrios, PA-C  Any Other Special Instructions Will Be Listed Below (If Applicable).       Sumner Boast, PA-C  02/18/2019 12:33 PM    [facility] [place], [place], [place]  [address] Phone: [phone]; Fax: [phone]

## 2019-02-17 NOTE — Assessment & Plan Note (Addendum)
We spent quite a bit of time discussing about the risk and benefits of anticoagulation therapy versus no anticoagulation therapy With her untreated cancer and chronic atrial fibrillation, she would be at risk of more blood clots and stroke On the other hand, if we resume her anticoagulation treatment, she could be at risk of excessive uterine bleeding causing severe anemia She has appointment to meet with her cardiologist tomorrow to discuss this A safer alternative, even though not perfect, would be aspirin therapy.

## 2019-02-17 NOTE — Telephone Encounter (Signed)
Left a message for patient's daughter Marlowe Kays advising of cardiology apt for 02/18/19 at 11:30 am.

## 2019-02-17 NOTE — Assessment & Plan Note (Signed)
She has history of colon cancer diagnosed almost 30 years ago.  We do not have records related to this.  She never received adjuvant treatment We discussed the risk and benefits of genetics counseling and genetic testing.  She is undecided.  She will think about it

## 2019-02-17 NOTE — Telephone Encounter (Signed)
Tried to call patient, no answer and unable to leave voicemail. 

## 2019-02-17 NOTE — Assessment & Plan Note (Signed)
We have extensive goals of care discussion today. At the end of today's visit, the patient has made informed decision not to pursue palliative treatment We discussed CODE STATUS.  She desire to be DNR She is not interested to pursue her kidney dialysis or feeding tube in the future in the event she developed multiorgan failure I recommend palliative care/hospice referral.  The patient is undecided. I recommend follow-up appointment to see her primary care doctor for further discussion.

## 2019-02-18 ENCOUNTER — Ambulatory Visit (INDEPENDENT_AMBULATORY_CARE_PROVIDER_SITE_OTHER): Payer: Medicare Other | Admitting: Physician Assistant

## 2019-02-18 ENCOUNTER — Telehealth: Payer: Self-pay | Admitting: Oncology

## 2019-02-18 ENCOUNTER — Encounter: Payer: Self-pay | Admitting: Physician Assistant

## 2019-02-18 VITALS — BP 130/72 | HR 80 | Ht 63.0 in | Wt 165.8 lb

## 2019-02-18 DIAGNOSIS — Z7901 Long term (current) use of anticoagulants: Secondary | ICD-10-CM | POA: Diagnosis not present

## 2019-02-18 DIAGNOSIS — Z7189 Other specified counseling: Secondary | ICD-10-CM

## 2019-02-18 DIAGNOSIS — I4891 Unspecified atrial fibrillation: Secondary | ICD-10-CM | POA: Diagnosis not present

## 2019-02-18 DIAGNOSIS — M546 Pain in thoracic spine: Secondary | ICD-10-CM

## 2019-02-18 MED ORDER — ISOSORBIDE MONONITRATE ER 30 MG PO TB24: 15.0000 mg | ORAL_TABLET | Freq: Every day | ORAL | 3 refills | Status: AC

## 2019-02-18 NOTE — Telephone Encounter (Addendum)
Received a message from [place], [place] daughter.  They would like to cancel the appointment with Dr. Denman George on Friday.   Called Marlowe Kays back and asked if they need assistance with palliative/hospice referral.  She said they would like to take a break for a few days to process everything.  She asked that we call her back on Tuesday.

## 2019-02-18 NOTE — Patient Instructions (Signed)
Medication Instructions:  Your physician has recommended you make the following change in your medication:   START: isosorbide mononitrate (imdur) 30 mg tablet: Take 1/2 tablet (15 mg total) by mouth once a day  Lab work: None Ordered  If you have labs (blood work) drawn today and your tests are completely normal, you will receive your results only by: Marland Kitchen MyChart Message (if you have MyChart) OR . A paper copy in the mail If you have any lab test that is abnormal or we need to change your treatment, we will call you to review the results.  Testing/Procedures: None ordered  Follow-Up: At [facility], you and your health needs are our priority.  As part of our continuing mission to provide you with exceptional heart care, we have created designated Provider Care Teams.  These Care Teams include your primary Cardiologist (physician) and Advanced Practice Providers (APPs -  Physician Assistants and Nurse Practitioners) who all work together to provide you with the care you need, when you need it. . You will need a follow up appointment in 4-6 months.  Please call our office 2 months in advance to schedule this appointment.  You may see Darlina Guys, MD or one of the following Advanced Practice Providers on your designated Care Team:   . Lyda Jester, PA-C . Dayna Dunn, PA-C . Ermalinda Barrios, PA-C  Any Other Special Instructions Will Be Listed Below (If Applicable).

## 2019-02-18 NOTE — Telephone Encounter (Signed)
I spoke to patient's daughter Marlowe Kays) and they are aware of appt today with Ermalinda Barrios @ 11:30 am.

## 2019-02-20 ENCOUNTER — Ambulatory Visit: Payer: Medicare Other | Admitting: Gynecologic Oncology

## 2019-02-23 ENCOUNTER — Ambulatory Visit: Payer: Medicare Other | Admitting: Cardiovascular Disease

## 2019-02-24 ENCOUNTER — Other Ambulatory Visit: Payer: Self-pay | Admitting: Oncology

## 2019-02-24 NOTE — Progress Notes (Signed)
Gynecologic Oncology Multi-Disciplinary Disposition Conference Note  Date of the Conference: 02/23/2019  Patient Name: [name]  Referring Provider: Dr. Matthew Saras Primary GYN Oncologist: Dr. Denman George  Stage/Disposition:  Stage IV uterine cancer. Disposition is for a short course of radiation to control bleeding and then to follow up with Dr. Denman George.   This Multidisciplinary conference took place involving physicians from [place], [place], Radiation Oncology, Pathology, Radiology along with the Gynecologic Oncology Nurse Practitioner and RN.  Comprehensive assessment of the patient's malignancy, staging, need for surgery, chemotherapy, radiation therapy, and need for further testing were reviewed. Supportive measures, both inpatient and following discharge were also discussed. The recommended plan of care is documented. Greater than 35 minutes were spent correlating and coordinating this patient's care.

## 2019-02-26 ENCOUNTER — Telehealth: Payer: Self-pay | Admitting: Oncology

## 2019-02-26 NOTE — Telephone Encounter (Signed)
Left a message for patient's daughter Marlowe Kays to check on patient's status and to see if they need any assistance.  Requested a return call.

## 2019-03-02 ENCOUNTER — Telehealth: Payer: Self-pay | Admitting: Oncology

## 2019-03-02 NOTE — Telephone Encounter (Signed)
Marlowe Kays called and asked if they can set up a second opinion with Dr. Hinton Rao in [place].  Advised her that I will check on arranging it and call her back.  Also asked her if they would like to set up an appointment with the radiation oncologist.  She is going to check with her mother and let us know.

## 2019-03-03 NOTE — Telephone Encounter (Signed)
Called [name] back and let her know that the referral has been faxed to the [facility].

## 2019-03-11 DIAGNOSIS — C7989 Secondary malignant neoplasm of other specified sites: Secondary | ICD-10-CM | POA: Diagnosis not present

## 2019-03-11 DIAGNOSIS — C549 Malignant neoplasm of corpus uteri, unspecified: Secondary | ICD-10-CM

## 2019-03-12 ENCOUNTER — Telehealth: Payer: Self-pay | Admitting: Oncology

## 2019-03-12 NOTE — Progress Notes (Signed)
Follow-up Note: Gyn-Onc  Consult was initially requested by Dr. Matthew Saras for the evaluation of [name] 84 y.o. female  CC:  Chief Complaint  Patient presents with  . Endometrial cancer [facility])    Assessment/Plan:  [name]  is a 85 y.o.  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   I again discussed with the patient why surgery would not be curative.  I discussed that the role of surgery is purely for local control of symptoms particularly bleeding.  I explained that while her uterus remains in situ with this large burden of tumor within it, it is best for her to stay off her anticoagulant therapy as this will likely just cause heavy bleeding.  I discussed that surgery could potentially be performed with a minimally invasive total hysterectomy.  However, given her poor general health and performance status, such as surgery might be accompanied with a prolonged recovery and further deterioration in performance status.   Palliative RT to the uterus to control bleeding may be one option that could avoid hysterectomy recovery but still achieve some local control of symptoms.   After discussion with Dr Hinton Rao her treating oncologist, we have agreed to a trial of systemic therapy (single agent carboplatin x 3 cycles) and then re-evaluation with PET for response. If she is not tolerating therapy or if she progresses on therapy, she should be considered for palliative care/hospice at that time. If she responds to therapy and is tolerating this well, we would consider her a candidate for palliative hysterectomy to help control bleeding symptoms.  If she were to develop heavy vaginal bleeding in the interim, she should be considered for palliative RT to control.    HPI: [name] is a 85 year old P4 who was seen in consultation at the request of Dr. Matthew Saras for uterine tumor.  The patient  reports developing vaginal bleeding in January 2020.  Similar to a menstrual-like flow.  She takes Coumadin for atrial fibrillation and felt that this was the cause of it.  Over the subsequent months she developed passage of clots and tissue.  She was seen by Dr. Matthew Saras who performed an endometrial biopsy in the office on Jerry 21st 2020 which revealed only blood clot and no endometrial tissue was identified.    A Ca1 25 was drawn on January 20, 2019 which was elevated at 73.7.  A transvaginal ultrasound scan was performed that same day which revealed a uterus that was enlarged measuring 11.3 x 5.7 x 8.3 cm.  The endometrium was significantly distended with blood clot.  No definite endometrial stripe was seen.  I 4.3 x 7.2 cm hyperechoic area was seen in the endometrial cavity extending into the cervix.  The ovaries were grossly normal bilaterally.    Given these findings a CT scan of the abdomen and pelvis was ordered and performed on January 30, 2019.  It revealed several renal cysts.  A 12 mm soft tissue density just superior to the celiac axis suspicious for mildly enlarged lymph nodes.  No other lymphadenopathy in the pelvis.  The uterus is enlarged with marked distention of the endometrial cavity by low-attenuation soft tissue.  No evidence of extrauterine extension and unremarkable adnexal regions.  Interval hx:  On February 03, 2027 a biopsy of the endometrium revealed high-grade malignancy consistent with a spindle cell neoplasm with an immunophenotype nonspecific but the differential includes carcinosarcoma and leiomyosarcoma.  The latter was slightly favored.    A PET CT scan was performed on 02/11/19 which revealed an intensely hypermetabolic activity within the uterine bodyconsistent with uterine/endometrial carcinoma. Scattered small hypermetabolic nodular/nodal metastatic deposits above and below the diaphragm. Deposits include a RIGHT internal mammary node, epicardial node, gastrohepatic  ligament node, lower abdomen ventral peritoneal node, and RIGHT inguinal node. No lung metastasis, liver metastasis or skeletal metastasis.  On 02/17/19 she saw Dr Heath Lark to discuss the possibility of chemotherapy. She elected for no chemotherapy, agreed to DNR status with a plan for hospice care.   The patient reports a bracing pain across the upper thoracic spine. Possibly worse after eating. Episodic. Has had for >3 years. Progressive in intensity and frequency. Not helped with zantac. Not associated with SOB, chest pain, LOC, neuro symptoms. She saw the PA in her cardiologist's office who did not identify a cause for this.  She met with Dr Hinton Rao who felt that she was a candidate for systemic therapy and the patient is interested in trying this.   Current Meds:  Outpatient Encounter Medications as of 03/13/2019  Medication Sig  . acetaminophen (TYLENOL) 325 MG tablet Take 325 mg by mouth every 6 (six) hours as needed for moderate pain or headache.  Marland Kitchen amoxicillin-clavulanate (AUGMENTIN) 500-125 MG tablet   . atorvastatin (LIPITOR) 10 MG tablet Take 10 mg by mouth Daily.   . furosemide (LASIX) 20 MG tablet Take 20 mg by mouth daily as needed for edema.   . gabapentin (NEURONTIN) 100 MG capsule Take 100 mg by mouth at bedtime.   . isosorbide mononitrate (IMDUR) 30 MG 24 hr tablet Take 0.5 tablets (15 mg total) by mouth daily.  Marland Kitchen levothyroxine (SYNTHROID, LEVOTHROID) 50 MCG tablet Take 50 mcg by mouth Daily.   Marland Kitchen lisinopril (PRINIVIL,ZESTRIL) 2.5 MG tablet Take 2.5 mg by mouth daily.  . meclizine (ANTIVERT) 25 MG tablet Take 25 mg by mouth 2 (two) times daily as needed for dizziness.   . metoprolol succinate (TOPROL XL) 25 MG 24 hr tablet Take 1 tablet (25 mg total) by mouth daily.  Marland Kitchen omeprazole (PRILOSEC) 20 MG capsule Take 20 mg by mouth daily.  . pantoprazole (PROTONIX) 40 MG tablet   . polyethylene glycol (MIRALAX / GLYCOLAX) packet Take 17 g by mouth daily as needed for moderate  constipation.   . senna (SENOKOT) 8.6 MG TABS Take 1 tablet by mouth daily as needed for moderate constipation.   . traMADol (ULTRAM) 50 MG tablet Take 25 mg by mouth every 6 (six) hours as needed for pain.  Marland Kitchen warfarin (COUMADIN) 3 MG tablet TAKE AS DIRECTED BY COUMADIN CLINIC (Patient taking differently: Take 1.5 mg by mouth at bedtime. )   No facility-administered encounter medications on file as of 03/13/2019.     Allergy:  Allergies  Allergen Reactions  . Adhesive [Tape] Rash  . Cardizem [Diltiazem Hcl] Other (See Comments) and Cough    Rash that caused dark spotting   . Latex Rash and Other (See Comments)    Per Patient Latex=lip swelling    Social Hx:   Social History   Socioeconomic History  . Marital status: Married    Spouse name: Not on file  . Number of children: Not on file  . Years of education: Not on file  . Highest education level: Not on file  Occupational History  . Occupation: RETIRED    Employer: [employer]  Social Needs  . Financial resource strain: Not on file  .  Food insecurity:    Worry: Not on file    Inability: Not on file  . Transportation needs:    Medical: Not on file    Non-medical: Not on file  Tobacco Use  . Smoking status: Never Smoker  . Smokeless tobacco: Never Used  Substance and Sexual Activity  . Alcohol use: No  . Drug use: No  . Sexual activity: Not on file  Lifestyle  . Physical activity:    Days per week: Not on file    Minutes per session: Not on file  . Stress: Not on file  Relationships  . Social connections:    Talks on phone: Not on file    Gets together: Not on file    Attends religious service: Not on file    Active member of club or organization: Not on file    Attends meetings of clubs or organizations: Not on file    Relationship status: Not on file  . Intimate partner violence:    Fear of current or ex partner: Not on file    Emotionally abused: Not on file    Physically abused: Not on file    Forced  sexual activity: Not on file  Other Topics Concern  . Not on file  Social History Narrative  . Not on file    Past Surgical Hx:  Past Surgical History:  Procedure Laterality Date  . BREAST EXCISIONAL BIOPSY Right 1991   benign  . BREAST SURGERY  1991   Rt br lump removed-benign  . COLON SURGERY  1996   CANCER REMOVAL  . GALLBLADDER SURGERY  2005  . LIVER BIOPSY  04/08/2012   Procedure: LIVER BIOPSY;  Surgeon: Adin Hector, MD;  Location: [place];  Service: General;;  laparoscopic  . TOTAL HIP ARTHROPLASTY  2004   RIGHT    Past Medical Hx:  Past Medical History:  Diagnosis Date  . Arthritis   . Arthritis pain   . Atrial fibrillation ([place])   . Cancer ([place])    colon, 1996  . Constipation   . Environmental allergies   . Hemochromatosis 1998  . History of colon cancer   . HTN (hypertension)   . Hx of echocardiogram    Echo (8/15):  EF 45-50%, diff HK, mild AI, MAC, mild to mod MR, severe LAE, mod RAE, PASP 32 mmHg  . PONV (postoperative nausea and vomiting)   . Thyroid disease   . Varicose vein of leg     Past Gynecological History:  See HPI No LMP recorded. Patient is postmenopausal.  Family Hx:  Family History  Problem Relation Age of Onset  . Heart disease Mother   . Stroke Mother   . Heart disease Father   . Pneumonia Brother   . Hypertension Maternal Grandfather   . Heart attack Neg Hx   . Breast cancer Neg Hx     Review of Systems:  Constitutional  Feels well,    ENT Normal appearing ears and nares bilaterally Skin/Breast  No rash, sores, jaundice, itching, dryness Cardiovascular  No chest pain, shortness of breath, or edema  Pulmonary  No cough or wheeze.  Gastro Intestinal  No nausea, vomitting, or diarrhoea. No bright red blood per rectum, no abdominal pain, change in bowel movement, or constipation.  Genito Urinary  No frequency, urgency, dysuria, + bleeding and passage of tissue (decreased since presentation).  Musculo Skeletal  No  myalgia, arthralgia, joint swelling or pain  Neurologic  No weakness, numbness, change  in gait,  Psychology  No depression, anxiety, insomnia.   Vitals:  Blood pressure 117/66, pulse 82, temperature 98.2 F (36.8 C), temperature source Oral, resp. rate 18, height _0  (1.6 m), weight 168 lb (76.2 kg), SpO2 98 %.  Physical Exam: WD in NAD Neck  Supple NROM, without any enlargements.  Lymph Node Survey No cervical supraclavicular or inguinal adenopathy Cardiovascular  Pulse normal rate, regularity and rhythm. S1 and S2 normal.  Lungs  Clear to auscultation bilateraly, without wheezes/crackles/rhonchi. Good air movement.  Skin  No rash/lesions/breakdown  Psychiatry  Alert and oriented to person, place, and time  Abdomen  Normoactive bowel sounds, abdomen soft, non-tender and nonobese without evidence of hernia.  Back No CVA tenderness Genito Urinary  deferred Rectal  deferred  Extremities  No bilateral cyanosis, clubbing or edema.     Thereasa Solo, MD  03/13/2019, 1:31 PM

## 2019-03-12 NOTE — Telephone Encounter (Signed)
Called Marlowe Kays back with apt to see Dr. Denman George at 10 am tomorrow.  She verbalized agreement.

## 2019-03-12 NOTE — Telephone Encounter (Signed)
Patient's daughter, Marlowe Kays, called and said her mother has decided to have a hysterectomy with Dr. Denman George.  Advised her that we will call her back with next steps.

## 2019-03-13 ENCOUNTER — Inpatient Hospital Stay: Payer: Medicare Other | Attending: Gynecologic Oncology | Admitting: Gynecologic Oncology

## 2019-03-13 ENCOUNTER — Other Ambulatory Visit: Payer: Self-pay

## 2019-03-13 ENCOUNTER — Encounter: Payer: Self-pay | Admitting: Gynecologic Oncology

## 2019-03-13 VITALS — BP 117/66 | HR 82 | Temp 98.2°F | Resp 18 | Ht 63.0 in | Wt 168.0 lb

## 2019-03-13 DIAGNOSIS — I10 Essential (primary) hypertension: Secondary | ICD-10-CM | POA: Insufficient documentation

## 2019-03-13 DIAGNOSIS — C541 Malignant neoplasm of endometrium: Secondary | ICD-10-CM | POA: Diagnosis present

## 2019-03-13 DIAGNOSIS — C778 Secondary and unspecified malignant neoplasm of lymph nodes of multiple regions: Secondary | ICD-10-CM | POA: Diagnosis not present

## 2019-03-13 DIAGNOSIS — M1990 Unspecified osteoarthritis, unspecified site: Secondary | ICD-10-CM | POA: Diagnosis not present

## 2019-03-13 DIAGNOSIS — I4891 Unspecified atrial fibrillation: Secondary | ICD-10-CM | POA: Insufficient documentation

## 2019-03-13 DIAGNOSIS — Z66 Do not resuscitate: Secondary | ICD-10-CM | POA: Diagnosis not present

## 2019-03-13 NOTE — Patient Instructions (Signed)
Dr. Denman George will speak with Dr. Hinton Rao about moving forward with chemotherapy. Plan to follow up after three cycles of chemotherapy with a repeat PET scan to see how the cancer is responding.  Surgery may be considered at that time if the cancer is responding.    If you develop heavy vaginal bleeding (soaking through a pad in one hour or less), please call our office so we can set you up to meet with Dr. Gery Pray to arrange for radiation to control the bleeding.  Please call for any questions or concerns at [phone].

## 2019-03-16 ENCOUNTER — Telehealth: Payer: Self-pay | Admitting: *Deleted

## 2019-03-16 NOTE — Telephone Encounter (Signed)
Faxed and routed the office note to Dr. Hinton Rao

## 2019-03-25 ENCOUNTER — Telehealth: Payer: Self-pay | Admitting: Oncology

## 2019-03-25 NOTE — Telephone Encounter (Signed)
Called Dr. Remi Deter office at [phone] to see if patient has been scheduled for chemotherapy.  She has not been scheduled yet so left a message for Dr. Remi Deter nurse to see what the plan will be.

## 2019-04-01 DIAGNOSIS — C549 Malignant neoplasm of corpus uteri, unspecified: Secondary | ICD-10-CM | POA: Diagnosis not present

## 2019-04-01 DIAGNOSIS — C7989 Secondary malignant neoplasm of other specified sites: Secondary | ICD-10-CM | POA: Diagnosis not present

## 2019-04-06 ENCOUNTER — Telehealth: Payer: Self-pay

## 2019-04-06 NOTE — Telephone Encounter (Signed)
[name] states that her mother is experiencing some bleeding vaginally and that the tumors from the uterus are dangling from the vagina. She spoke with radiation at [facility] last evening and they advised her to call Dr. Serita Grit office. Radiation is to begin today ~1200.  Reviewed with Joylene John, NP. She stated that the tumors will begin to fall out once radiation begins.  Pt/ family can try to see if tumors easily dislodge. They cannot be cut out.  The issue for biopsies in the office were not cut out.The tumors were loose in the vagina. The daughter will bring her mother to radation as planned.

## 2019-04-09 ENCOUNTER — Telehealth: Payer: Self-pay | Admitting: Oncology

## 2019-04-09 IMAGING — CR DG CHEST 2V
3 series · 3 of 3 positions shown · non-contrast
Comparison: Chest x-rays dated 04/24/2013 and 04/07/2013.

CLINICAL DATA: Fever today.  Recent history of acid reflux.  Cough.

EXAM:
CHEST - 2 VIEW

[w chest lat (1 of 2)]
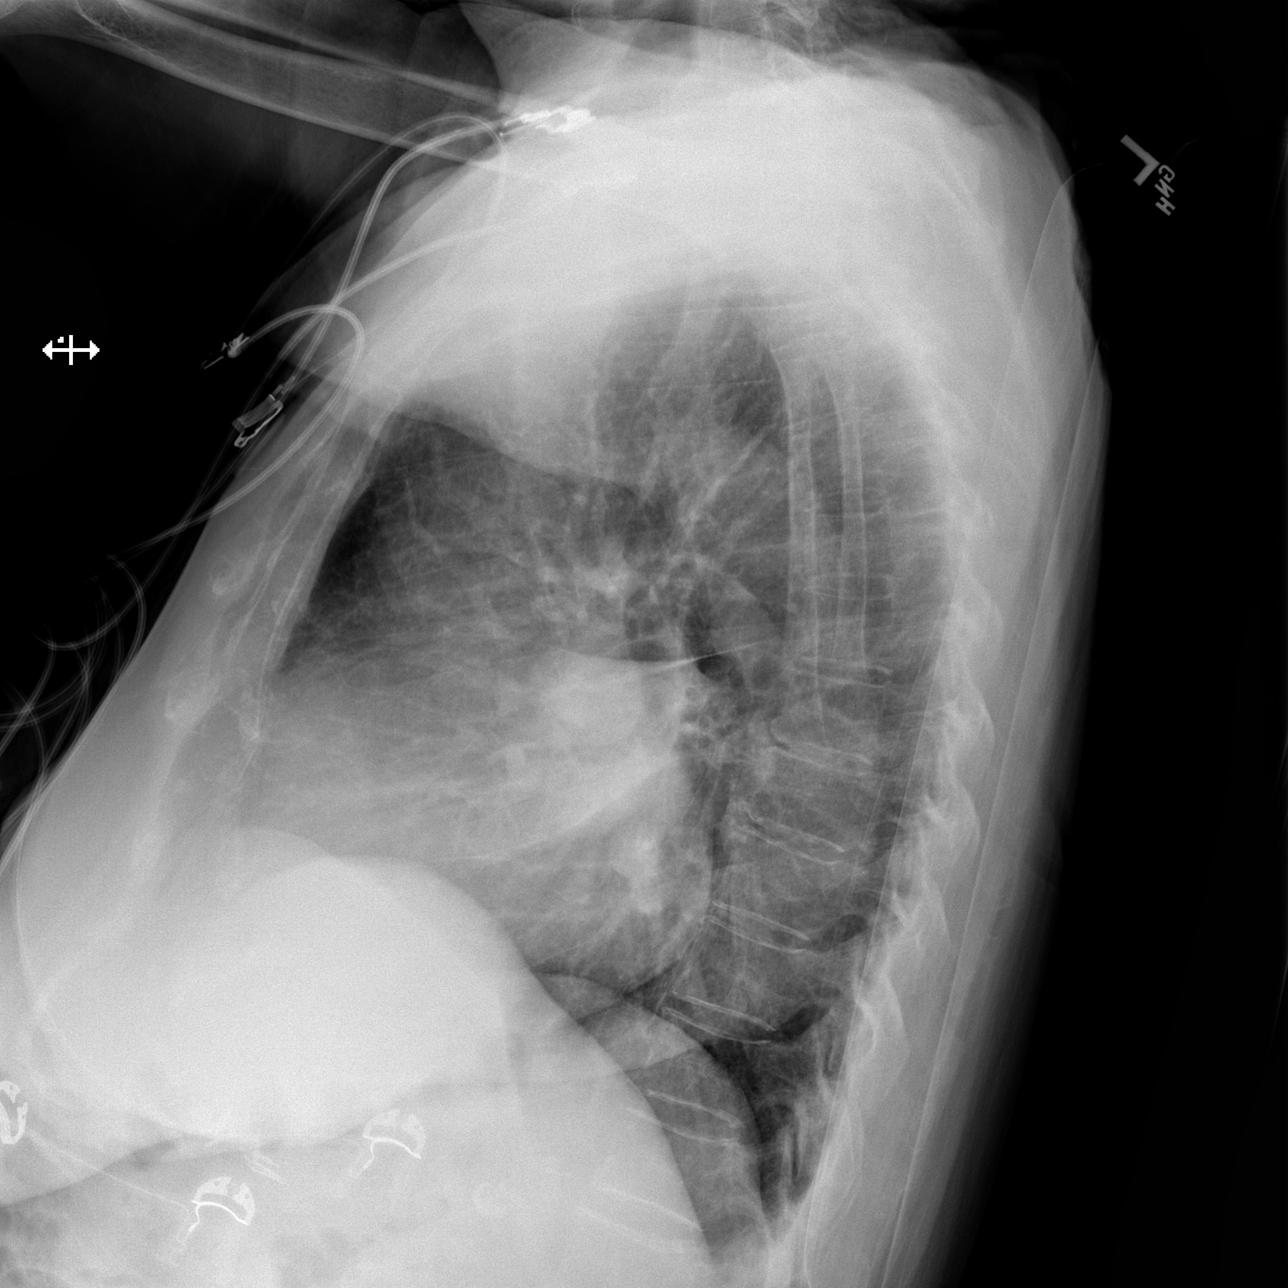

[w chest lat (2 of 2)]
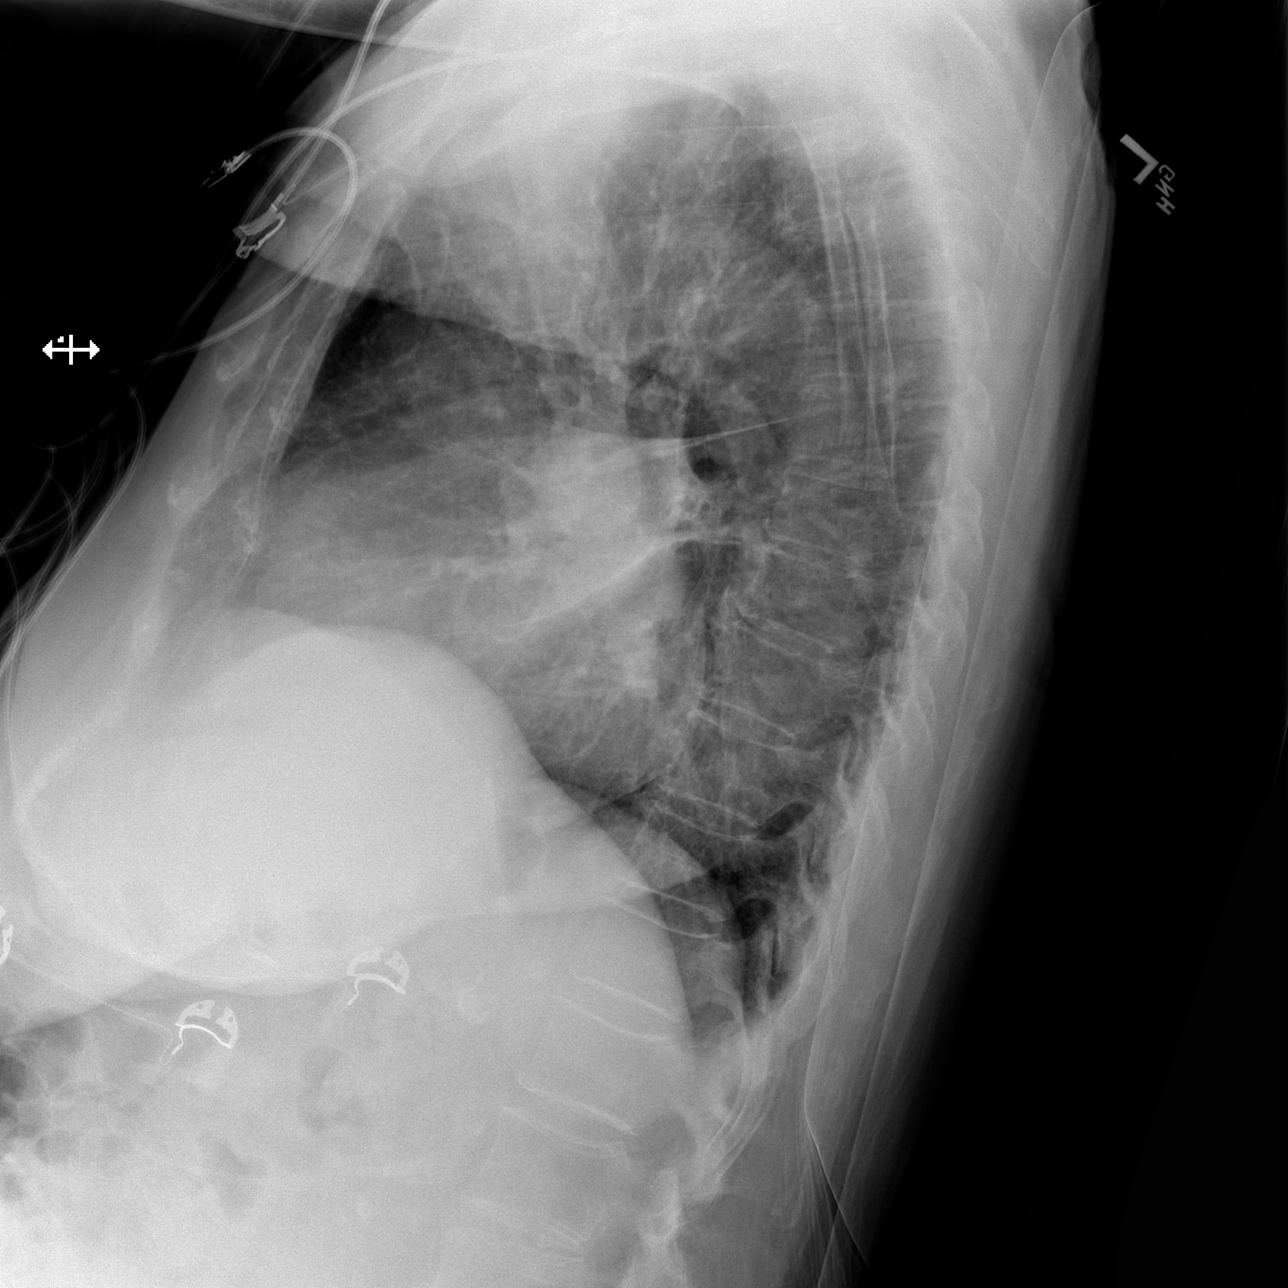

[x chest ap]
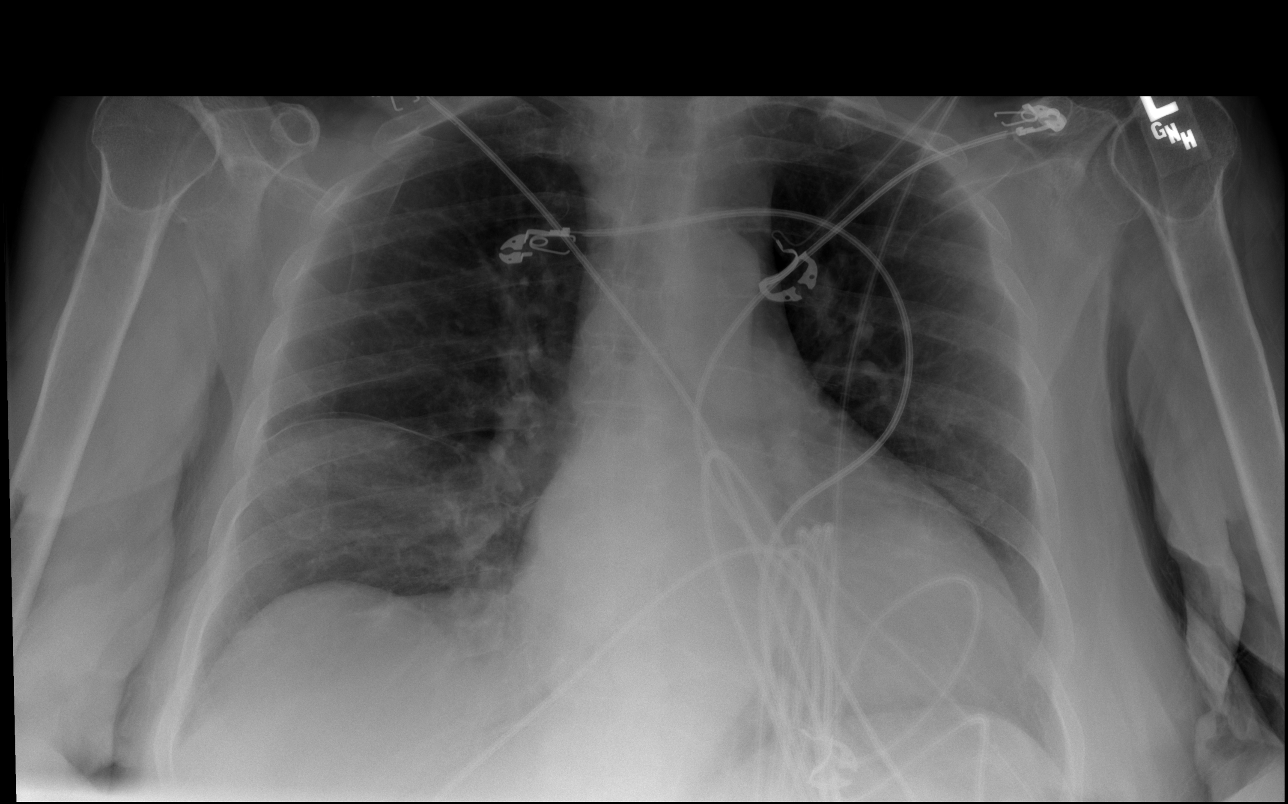

[3 of 3 positions shown; findings below may reference images not displayed]

FINDINGS: Ill-defined opacity within the RIGHT lower lung, infrahilar,
suggesting pneumonia. No pleural effusion or pneumothorax seen.
Stable cardiomegaly.
IMPRESSION: RIGHT infrahilar pneumonia, most likely within the RIGHT middle
lobe.

## 2019-04-09 NOTE — Telephone Encounter (Signed)
Called [place] regarding [name]'s status.  She said [name] started radiation on Monday and already is starting to feel better.  She is bleeding just a little bit now.  She said several of the tumors fell out on Monday.  She said the plan is for radiation until the end of April and then they may consider low dose chemotherapy.

## 2019-04-16 ENCOUNTER — Telehealth: Payer: Self-pay | Admitting: Oncology

## 2019-04-16 NOTE — Telephone Encounter (Signed)
Received a message from Amy at Dr. Remi Deter office that patient has decided not to have chemotherapy.  They said to call [phone] with any questions.

## 2019-04-30 ENCOUNTER — Other Ambulatory Visit: Payer: Self-pay

## 2019-04-30 ENCOUNTER — Inpatient Hospital Stay (HOSPITAL_COMMUNITY)
Admission: EM | Admit: 2019-04-30 | Discharge: 2019-05-01 | DRG: 871 | Disposition: A | Discharge status: Hospice | Payer: Medicare Other | Attending: Internal Medicine | Admitting: Internal Medicine

## 2019-04-30 ENCOUNTER — Emergency Department (HOSPITAL_COMMUNITY): Payer: Medicare Other

## 2019-04-30 ENCOUNTER — Encounter (HOSPITAL_COMMUNITY): Payer: Self-pay | Admitting: Emergency Medicine

## 2019-04-30 ENCOUNTER — Inpatient Hospital Stay (HOSPITAL_COMMUNITY): Payer: Medicare Other

## 2019-04-30 ENCOUNTER — Other Ambulatory Visit (HOSPITAL_COMMUNITY): Payer: Medicare Other

## 2019-04-30 DIAGNOSIS — A419 Sepsis, unspecified organism: Principal | ICD-10-CM | POA: Diagnosis present

## 2019-04-30 DIAGNOSIS — Z79899 Other long term (current) drug therapy: Secondary | ICD-10-CM

## 2019-04-30 DIAGNOSIS — R471 Dysarthria and anarthria: Secondary | ICD-10-CM | POA: Diagnosis present

## 2019-04-30 DIAGNOSIS — E785 Hyperlipidemia, unspecified: Secondary | ICD-10-CM | POA: Diagnosis present

## 2019-04-30 DIAGNOSIS — I10 Essential (primary) hypertension: Secondary | ICD-10-CM | POA: Diagnosis present

## 2019-04-30 DIAGNOSIS — G9341 Metabolic encephalopathy: Secondary | ICD-10-CM | POA: Diagnosis present

## 2019-04-30 DIAGNOSIS — R29708 NIHSS score 8: Secondary | ICD-10-CM | POA: Diagnosis present

## 2019-04-30 DIAGNOSIS — C7801 Secondary malignant neoplasm of right lung: Secondary | ICD-10-CM | POA: Diagnosis present

## 2019-04-30 DIAGNOSIS — C55 Malignant neoplasm of uterus, part unspecified: Secondary | ICD-10-CM | POA: Diagnosis present

## 2019-04-30 DIAGNOSIS — R4701 Aphasia: Secondary | ICD-10-CM | POA: Diagnosis present

## 2019-04-30 DIAGNOSIS — Z91048 Other nonmedicinal substance allergy status: Secondary | ICD-10-CM

## 2019-04-30 DIAGNOSIS — Z7189 Other specified counseling: Secondary | ICD-10-CM | POA: Diagnosis not present

## 2019-04-30 DIAGNOSIS — C772 Secondary and unspecified malignant neoplasm of intra-abdominal lymph nodes: Secondary | ICD-10-CM | POA: Diagnosis present

## 2019-04-30 DIAGNOSIS — Z888 Allergy status to other drugs, medicaments and biological substances status: Secondary | ICD-10-CM

## 2019-04-30 DIAGNOSIS — K8050 Calculus of bile duct without cholangitis or cholecystitis without obstruction: Secondary | ICD-10-CM | POA: Diagnosis present

## 2019-04-30 DIAGNOSIS — I482 Chronic atrial fibrillation, unspecified: Secondary | ICD-10-CM | POA: Diagnosis present

## 2019-04-30 DIAGNOSIS — G9340 Encephalopathy, unspecified: Secondary | ICD-10-CM

## 2019-04-30 DIAGNOSIS — R29810 Facial weakness: Secondary | ICD-10-CM | POA: Diagnosis present

## 2019-04-30 DIAGNOSIS — I444 Left anterior fascicular block: Secondary | ICD-10-CM | POA: Diagnosis present

## 2019-04-30 DIAGNOSIS — I6340 Cerebral infarction due to embolism of unspecified cerebral artery: Secondary | ICD-10-CM | POA: Diagnosis present

## 2019-04-30 DIAGNOSIS — C549 Malignant neoplasm of corpus uteri, unspecified: Secondary | ICD-10-CM | POA: Diagnosis not present

## 2019-04-30 DIAGNOSIS — Z7901 Long term (current) use of anticoagulants: Secondary | ICD-10-CM

## 2019-04-30 DIAGNOSIS — Z66 Do not resuscitate: Secondary | ICD-10-CM | POA: Diagnosis not present

## 2019-04-30 DIAGNOSIS — I340 Nonrheumatic mitral (valve) insufficiency: Secondary | ICD-10-CM | POA: Diagnosis present

## 2019-04-30 DIAGNOSIS — Z515 Encounter for palliative care: Secondary | ICD-10-CM | POA: Diagnosis present

## 2019-04-30 DIAGNOSIS — Z79890 Hormone replacement therapy (postmenopausal): Secondary | ICD-10-CM

## 2019-04-30 DIAGNOSIS — D320 Benign neoplasm of cerebral meninges: Secondary | ICD-10-CM | POA: Diagnosis present

## 2019-04-30 DIAGNOSIS — R0902 Hypoxemia: Secondary | ICD-10-CM | POA: Diagnosis present

## 2019-04-30 DIAGNOSIS — Z823 Family history of stroke: Secondary | ICD-10-CM

## 2019-04-30 DIAGNOSIS — N710 Acute inflammatory disease of uterus: Secondary | ICD-10-CM | POA: Diagnosis present

## 2019-04-30 DIAGNOSIS — Z8249 Family history of ischemic heart disease and other diseases of the circulatory system: Secondary | ICD-10-CM

## 2019-04-30 DIAGNOSIS — E039 Hypothyroidism, unspecified: Secondary | ICD-10-CM | POA: Diagnosis present

## 2019-04-30 DIAGNOSIS — Z85038 Personal history of other malignant neoplasm of large intestine: Secondary | ICD-10-CM

## 2019-04-30 DIAGNOSIS — Z6829 Body mass index (BMI) 29.0-29.9, adult: Secondary | ICD-10-CM

## 2019-04-30 DIAGNOSIS — I4891 Unspecified atrial fibrillation: Secondary | ICD-10-CM | POA: Diagnosis not present

## 2019-04-30 DIAGNOSIS — Z91040 Latex allergy status: Secondary | ICD-10-CM

## 2019-04-30 DIAGNOSIS — E669 Obesity, unspecified: Secondary | ICD-10-CM | POA: Diagnosis present

## 2019-04-30 DIAGNOSIS — Z923 Personal history of irradiation: Secondary | ICD-10-CM

## 2019-04-30 DIAGNOSIS — J910 Malignant pleural effusion: Secondary | ICD-10-CM | POA: Diagnosis present

## 2019-04-30 DIAGNOSIS — R4182 Altered mental status, unspecified: Secondary | ICD-10-CM | POA: Diagnosis present

## 2019-04-30 LAB — DIFFERENTIAL
Abs Immature Granulocytes: 0.11 10*3/uL — ABNORMAL HIGH (ref 0.00–0.07)
Basophils Absolute: 0 10*3/uL (ref 0.0–0.1)
Basophils Relative: 0 %
Eosinophils Absolute: 0 10*3/uL (ref 0.0–0.5)
Eosinophils Relative: 0 %
Immature Granulocytes: 1 %
Lymphocytes Relative: 6 %
Lymphs Abs: 1 10*3/uL (ref 0.7–4.0)
Monocytes Absolute: 1.1 10*3/uL — ABNORMAL HIGH (ref 0.1–1.0)
Monocytes Relative: 7 %
Neutro Abs: 13.8 10*3/uL — ABNORMAL HIGH (ref 1.7–7.7)
Neutrophils Relative %: 86 %

## 2019-04-30 LAB — POCT I-STAT EG7
Acid-Base Excess: 4 mmol/L — ABNORMAL HIGH (ref 0.0–2.0)
Bicarbonate: 26.7 mmol/L (ref 20.0–28.0)
Calcium, Ion: 1.2 mmol/L (ref 1.15–1.40)
HCT: 33 % — ABNORMAL LOW (ref 36.0–46.0)
Hemoglobin: 11.2 g/dL — ABNORMAL LOW (ref 12.0–15.0)
O2 Saturation: 93 %
Potassium: 4 mmol/L (ref 3.5–5.1)
Sodium: 134 mmol/L — ABNORMAL LOW (ref 135–145)
TCO2: 28 mmol/L (ref 22–32)
pCO2, Ven: 33.8 mmHg — ABNORMAL LOW (ref 44.0–60.0)
pH, Ven: 7.506 — ABNORMAL HIGH (ref 7.250–7.430)
pO2, Ven: 61 mmHg — ABNORMAL HIGH (ref 32.0–45.0)

## 2019-04-30 LAB — CBC
HCT: 38.6 % (ref 36.0–46.0)
Hemoglobin: 11.6 g/dL — ABNORMAL LOW (ref 12.0–15.0)
MCH: 31.1 pg (ref 26.0–34.0)
MCHC: 30.1 g/dL (ref 30.0–36.0)
MCV: 103.5 fL — ABNORMAL HIGH (ref 80.0–100.0)
Platelets: 261 10*3/uL (ref 150–400)
RBC: 3.73 MIL/uL — ABNORMAL LOW (ref 3.87–5.11)
RDW: 12.6 % (ref 11.5–15.5)
WBC: 16.1 10*3/uL — ABNORMAL HIGH (ref 4.0–10.5)
nRBC: 0 % (ref 0.0–0.2)

## 2019-04-30 LAB — COMPREHENSIVE METABOLIC PANEL
ALT: 12 U/L (ref 0–44)
AST: 15 U/L (ref 15–41)
Albumin: 2.3 g/dL — ABNORMAL LOW (ref 3.5–5.0)
Alkaline Phosphatase: 88 U/L (ref 38–126)
Anion gap: 13 (ref 5–15)
BUN: 17 mg/dL (ref 8–23)
CO2: 24 mmol/L (ref 22–32)
Calcium: 9.4 mg/dL (ref 8.9–10.3)
Chloride: 99 mmol/L (ref 98–111)
Creatinine, Ser: 0.88 mg/dL (ref 0.44–1.00)
GFR calc Af Amer: >60 mL/min (ref >60)
GFR calc non Af Amer: 59 mL/min — ABNORMAL LOW (ref >60)
Glucose, Bld: 91 mg/dL (ref 70–99)
Potassium: 4 mmol/L (ref 3.5–5.1)
Sodium: 136 mmol/L (ref 135–145)
Total Bilirubin: 1.6 mg/dL — ABNORMAL HIGH (ref 0.3–1.2)
Total Protein: 5.7 g/dL — ABNORMAL LOW (ref 6.5–8.1)

## 2019-04-30 LAB — I-STAT CREATININE, ED: Creatinine, Ser: 0.5 mg/dL (ref 0.44–1.00)

## 2019-04-30 LAB — AMMONIA: Ammonia: 17 umol/L (ref 9–35)

## 2019-04-30 LAB — PROTIME-INR
INR: 1.2 (ref 0.8–1.2)
Prothrombin Time: 15.4 seconds — ABNORMAL HIGH (ref 11.4–15.2)

## 2019-04-30 LAB — ETHANOL: Alcohol, Ethyl (B): <10 mg/dL (ref <10)

## 2019-04-30 LAB — APTT: aPTT: 27 seconds (ref 24–36)

## 2019-04-30 LAB — TSH: TSH: 0.914 u[IU]/mL (ref 0.350–4.500)

## 2019-04-30 MED ORDER — METOPROLOL TARTRATE 5 MG/5ML IV SOLN
2.5000 mg | Freq: Once | INTRAVENOUS | Status: AC
  Administered 2019-04-30: 2.5 mg via INTRAVENOUS
  Filled 2019-04-30: qty 5

## 2019-04-30 MED ORDER — IOHEXOL 350 MG/ML SOLN
100.0000 mL | Freq: Once | INTRAVENOUS | Status: AC | PRN
  Administered 2019-04-30: 100 mL via INTRAVENOUS

## 2019-04-30 MED ORDER — STROKE: EARLY STAGES OF RECOVERY BOOK
Freq: Once | Status: AC
  Administered 2019-05-01: 18:00:00

## 2019-04-30 MED ORDER — ATORVASTATIN CALCIUM 10 MG PO TABS
10.0000 mg | ORAL_TABLET | Freq: Every day | ORAL | Status: DC
  Administered 2019-05-01: 10 mg via ORAL
  Filled 2019-04-30: qty 1

## 2019-04-30 MED ORDER — AMPICILLIN 500 MG PO CAPS
500.0000 mg | ORAL_CAPSULE | Freq: Four times a day (QID) | ORAL | Status: DC
  Administered 2019-04-30 – 2019-05-01 (×5): 500 mg via ORAL
  Filled 2019-04-30 (×7): qty 1

## 2019-04-30 MED ORDER — ACETAMINOPHEN 650 MG RE SUPP: 650.0000 mg | RECTAL | Status: DC | PRN

## 2019-04-30 MED ORDER — TRAMADOL HCL 50 MG PO TABS
25.0000 mg | ORAL_TABLET | Freq: Two times a day (BID) | ORAL | Status: DC | PRN
  Administered 2019-04-30: 50 mg via ORAL
  Filled 2019-04-30 (×2): qty 1

## 2019-04-30 MED ORDER — SODIUM CHLORIDE 0.9 % IV SOLN
INTRAVENOUS | Status: DC
  Administered 2019-04-30 – 2019-05-01 (×2): via INTRAVENOUS

## 2019-04-30 MED ORDER — ACETAMINOPHEN 325 MG PO TABS
650.0000 mg | ORAL_TABLET | ORAL | Status: DC | PRN
  Administered 2019-05-01: 650 mg via ORAL
  Filled 2019-04-30: qty 2

## 2019-04-30 MED ORDER — METRONIDAZOLE 500 MG PO TABS
500.0000 mg | ORAL_TABLET | Freq: Four times a day (QID) | ORAL | Status: DC
  Administered 2019-04-30 – 2019-05-01 (×5): 500 mg via ORAL
  Filled 2019-04-30 (×5): qty 1

## 2019-04-30 MED ORDER — GABAPENTIN 100 MG PO CAPS
100.0000 mg | ORAL_CAPSULE | Freq: Every day | ORAL | Status: DC
  Administered 2019-04-30: 100 mg via ORAL
  Filled 2019-04-30: qty 1

## 2019-04-30 MED ORDER — PANTOPRAZOLE SODIUM 40 MG PO TBEC
40.0000 mg | DELAYED_RELEASE_TABLET | Freq: Every day | ORAL | Status: DC
  Administered 2019-05-01: 11:00:00 40 mg via ORAL
  Filled 2019-04-30: qty 1

## 2019-04-30 MED ORDER — LEVOTHYROXINE SODIUM 50 MCG PO TABS
50.0000 ug | ORAL_TABLET | Freq: Every day | ORAL | Status: DC
  Administered 2019-05-01: 06:00:00 50 ug via ORAL
  Filled 2019-04-30: qty 1

## 2019-04-30 MED ORDER — ACETAMINOPHEN 160 MG/5ML PO SOLN
650.0000 mg | ORAL | Status: DC | PRN
  Administered 2019-05-01 (×2): 650 mg
  Filled 2019-04-30 (×2): qty 20.3

## 2019-04-30 MED ORDER — SENNOSIDES-DOCUSATE SODIUM 8.6-50 MG PO TABS: 1.0000 | ORAL_TABLET | Freq: Every evening | ORAL | Status: DC | PRN

## 2019-04-30 NOTE — Progress Notes (Signed)
Patient arrived to unit. A&O x4. Patient states no pain. Generalized bruising. Tele has been placed and verified. Patient oriented to room and call bell placed within reach. Nurse will continue to monitor. [place]

## 2019-04-30 NOTE — ED Notes (Signed)
ED TO INPATIENT HANDOFF REPORT  ED Nurse Name and Phone #: Sherrine Maples [phone]  S Name/Age/Gender [name] 85 y.o. female Room/Bed: 031C/031C  Code Status   Code Status: DNR  Home/SNF/Other Home Patient oriented to: self Is this baseline? Yes   Triage Complete: Triage complete  Chief Complaint code stroke  Triage Note Pt brought in by [facility]. pts daughter called due to pt has weakness and altered mental status. LKW at 0415 today. Pt has a history of uterine cancer and is currently receiving radiation. Pt started new meds for cancer a couple days ago and is now experiencing the altered mental status.   130/78 HR 120-140 afib rvr 100% NRB CBG 121   Allergies Allergies  Allergen Reactions  . Adhesive [Tape] Rash  . Cardizem [Diltiazem Hcl] Other (See Comments) and Cough    Rash that caused dark spotting   . Latex Rash and Other (See Comments)    Per Patient Latex=lip swelling    Level of Care/Admitting Diagnosis ED Disposition    ED Disposition Condition Comment   Admit  Hospital Area: [facility] [[id number]]  Level of Care: Telemetry Medical [104]  Covid Evaluation: N/A  Diagnosis: Encephalopathy acute [299371]  Admitting Physician: Lady Deutscher [[id number]]  Attending Physician: Lady Deutscher [[id number]]  Estimated length of stay: past midnight tomorrow  Certification:: I certify this patient will need inpatient services for at least 2 midnights  PT Class (Do Not Modify): Inpatient [101]  PT Acc Code (Do Not Modify): Private [1]       B Medical/Surgery History Past Medical History:  Diagnosis Date  . Arthritis   . Arthritis pain   . Atrial fibrillation ([place])   . Cancer ([place])    colon, 1996  . Constipation   . Environmental allergies   . Hemochromatosis 1998  . History of colon cancer   . HTN (hypertension)   . Hx of echocardiogram    Echo (8/15):  EF 45-50%, diff HK, mild AI, MAC, mild to mod MR, severe LAE, mod RAE,  PASP 32 mmHg  . PONV (postoperative nausea and vomiting)   . Thyroid disease   . Varicose vein of leg    Past Surgical History:  Procedure Laterality Date  . BREAST EXCISIONAL BIOPSY Right 1991   benign  . BREAST SURGERY  1991   Rt br lump removed-benign  . COLON SURGERY  1996   CANCER REMOVAL  . GALLBLADDER SURGERY  2005  . LIVER BIOPSY  04/08/2012   Procedure: LIVER BIOPSY;  Surgeon: Adin Hector, MD;  Location: [facility];  Service: General;;  laparoscopic  . TOTAL HIP ARTHROPLASTY  2004   RIGHT     A IV Location/Drains/Wounds Patient Lines/Drains/Airways Status   Active Line/Drains/Airways    Name:   Placement date:   Placement time:   Site:   Days:   Peripheral IV 09/14/18 Left;Posterior Forearm   09/14/18    1515    Forearm   228   Peripheral IV 04/30/19 Left;Upper Forearm   04/30/19    1239    Forearm   less than 1          Intake/Output Last 24 hours No intake or output data in the 24 hours ending 04/30/19 1539  Labs/Imaging Results for orders placed or performed during the hospital encounter of 04/30/19 (from the past 48 hour(s))  Ethanol     Status: None   Collection Time: 04/30/19 12:18 PM  Result Value Ref  Range   Alcohol, Ethyl (B) <10 <10 mg/dL    Comment: (NOTE) Lowest detectable limit for serum alcohol is 10 mg/dL. For medical purposes only. Performed at [facility], [place] [address] [place], [place] [address]   Protime-INR     Status: Abnormal   Collection Time: 04/30/19 12:18 PM  Result Value Ref Range   Prothrombin Time 15.4 (H) 11.4 - 15.2 seconds   INR 1.2 0.8 - 1.2    Comment: (NOTE) INR goal varies based on device and disease states. Performed at [facility], [place] [address] [place], [place] [address]   APTT     Status: None   Collection Time: 04/30/19 12:18 PM  Result Value Ref Range   aPTT 27 24 - 36 seconds    Comment: Performed at [facility] [address] [place], [place] [address]  CBC     Status: Abnormal    Collection Time: 04/30/19 12:18 PM  Result Value Ref Range   WBC 16.1 (H) 4.0 - 10.5 K/uL   RBC 3.73 (L) 3.87 - 5.11 MIL/uL   Hemoglobin 11.6 (L) 12.0 - 15.0 g/dL   HCT 38.6 36.0 - 46.0 %   MCV 103.5 (H) 80.0 - 100.0 fL   MCH 31.1 26.0 - 34.0 pg   MCHC 30.1 30.0 - 36.0 g/dL   RDW 12.6 11.5 - 15.5 %   Platelets 261 150 - 400 K/uL   nRBC 0.0 0.0 - 0.2 %    Comment: Performed at [facility], [place] [address], [place], [place] [address]  Differential     Status: Abnormal   Collection Time: 04/30/19 12:18 PM  Result Value Ref Range   Neutrophils Relative % 86 %   Neutro Abs 13.8 (H) 1.7 - 7.7 K/uL   Lymphocytes Relative 6 %   Lymphs Abs 1.0 0.7 - 4.0 K/uL   Monocytes Relative 7 %   Monocytes Absolute 1.1 (H) 0.1 - 1.0 K/uL   Eosinophils Relative 0 %   Eosinophils Absolute 0.0 0.0 - 0.5 K/uL   Basophils Relative 0 %   Basophils Absolute 0.0 0.0 - 0.1 K/uL   Immature Granulocytes 1 %   Abs Immature Granulocytes 0.11 (H) 0.00 - 0.07 K/uL    Comment: Performed at [facility] [address] [place], [place] [address]  Comprehensive metabolic panel     Status: Abnormal   Collection Time: 04/30/19 12:18 PM  Result Value Ref Range   Sodium 136 135 - 145 mmol/L   Potassium 4.0 3.5 - 5.1 mmol/L   Chloride 99 98 - 111 mmol/L   CO2 24 22 - 32 mmol/L   Glucose, Bld 91 70 - 99 mg/dL   BUN 17 8 - 23 mg/dL   Creatinine, Ser 0.88 0.44 - 1.00 mg/dL   Calcium 9.4 8.9 - 10.3 mg/dL   Total Protein 5.7 (L) 6.5 - 8.1 g/dL   Albumin 2.3 (L) 3.5 - 5.0 g/dL   AST 15 15 - 41 U/L   ALT 12 0 - 44 U/L   Alkaline Phosphatase 88 38 - 126 U/L   Total Bilirubin 1.6 (H) 0.3 - 1.2 mg/dL   GFR calc non Af Amer 59 (L) >60 mL/min   GFR calc Af Amer >60 >60 mL/min   Anion gap 13 5 - 15    Comment: Performed at [facility] [address], [place],  [address]  I-stat Creatinine, ED  Status: None   Collection Time: 04/30/19 12:23 PM  Result Value Ref Range   Creatinine, Ser  0.50 0.44 - 1.00 mg/dL  Ammonia     Status: None   Collection Time: 04/30/19  2:02 PM  Result Value Ref Range   Ammonia 17 9 - 35 umol/L    Comment: Performed at [facility], [place] [address] [place], [place] [address]  TSH     Status: None   Collection Time: 04/30/19  2:02 PM  Result Value Ref Range   TSH 0.914 0.350 - 4.500 uIU/mL    Comment: Performed by a 3rd Generation assay with a functional sensitivity of <=0.01 uIU/mL. Performed at [facility], [place] [address] [place], [place] [address]   POCT I-Stat EG7     Status: Abnormal   Collection Time: 04/30/19  2:16 PM  Result Value Ref Range   pH, Ven 7.506 (H) 7.250 - 7.430   pCO2, Ven 33.8 (L) 44.0 - 60.0 mmHg   pO2, Ven 61.0 (H) 32.0 - 45.0 mmHg   Bicarbonate 26.7 20.0 - 28.0 mmol/L   TCO2 28 22 - 32 mmol/L   O2 Saturation 93.0 %   Acid-Base Excess 4.0 (H) 0.0 - 2.0 mmol/L   Sodium 134 (L) 135 - 145 mmol/L   Potassium 4.0 3.5 - 5.1 mmol/L   Calcium, Ion 1.20 1.15 - 1.40 mmol/L   HCT 33.0 (L) 36.0 - 46.0 %   Hemoglobin 11.2 (L) 12.0 - 15.0 g/dL   Patient temperature HIDE    Sample type VENOUS    Ct Angio Chest Pe W And/or Wo Contrast  Result Date: 04/30/2019 CLINICAL DATA:  Acute generalized abdominal pain. EXAM: CT ANGIOGRAPHY CHEST CT ABDOMEN AND PELVIS WITH CONTRAST TECHNIQUE: Multidetector CT imaging of the chest was performed using the standard protocol during bolus administration of intravenous contrast. Multiplanar CT image reconstructions and MIPs were obtained to evaluate the vascular anatomy. Multidetector CT imaging of the abdomen and pelvis was performed using the standard protocol during bolus administration of intravenous contrast. CONTRAST:  170m OMNIPAQUE IOHEXOL 350 MG/ML SOLN COMPARISON:  PET scan of February 11, 2019. CT scan of January 30, 2019. FINDINGS: CTA CHEST FINDINGS Cardiovascular: Satisfactory opacification of the pulmonary arteries to the segmental level. No evidence of pulmonary  embolism. Mild cardiomegaly. No pericardial effusion. Mediastinum/Nodes: No enlarged mediastinal, hilar, or axillary lymph nodes. Thyroid gland, trachea, and esophagus demonstrate no significant findings. Lungs/Pleura: No pneumothorax is noted. Minimal left pleural effusion is noted with adjacent subsegmental atelectasis of the left lower lobe. New 1 cm subpleural nodule is noted in the right upper lobe concerning for possible metastatic disease. Mild lingular subsegmental atelectasis or infiltrate is noted. Musculoskeletal: No chest wall abnormality. No acute or significant osseous findings. Review of the MIP images confirms the above findings. CT ABDOMEN and PELVIS FINDINGS Hepatobiliary: Status post cholecystectomy. Stable intrahepatic and extrahepatic biliary dilatation is noted. Probable stones are noted in the distal common bile duct consistent with choledocholithiasis. Stable hepatic cysts are noted compared to prior exam. Pancreas: Unremarkable. No pancreatic ductal dilatation or surrounding inflammatory changes. Spleen: Normal in size without focal abnormality. Adrenals/Urinary Tract: Adrenal glands appear normal. Nonobstructive left renal calculus is noted. No hydronephrosis or renal obstruction is noted. Stable right renal cysts are noted. Urinary bladder is unremarkable. Stomach/Bowel: Stomach appears normal. There is no evidence of bowel obstruction or inflammation. The appendix is not visualized. Vascular/Lymphatic: No significant vascular abnormality is noted. Stable celiac lymph node is noted  measuring 11 mm. Interval development of 16 mm lymph node in pre aortic space concerning for metastatic disease. Reproductive: There is interval development of large amount of fluid within the uterus that measures 20 x 8 cm. There is some high density material within this suggesting possible hematoma with small amount of gas present suggesting possible infection or abscess. Probable hydrosalpinx is seen on the  right. No left adnexal abnormality is noted. Other: No abdominal wall hernia or abnormality. No abdominopelvic ascites. Musculoskeletal: No acute or significant osseous findings. Review of the MIP images confirms the above findings. IMPRESSION: No definite evidence of pulmonary embolus. Interval development of foot appears to be large amount of complex fluid and gas within the uterus which measures 20 x 8 cm. Some high density material is noted suggesting hemorrhage, with some gas present is well suggesting possible infection or abscess. Clinical correlation is recommended. New 1 cm right upper lobe pulmonary nodule is noted concerning for metastatic disease. New 16 mm preaortic lymph node is noted in the retroperitoneal space concerning for metastatic disease. Minimal left pleural effusion is noted with adjacent subsegmental atelectasis of the left lower lobe. Stable intrahepatic and extrahepatic biliary dilatation is noted secondary to choledocholithiasis. Nonobstructive left renal calculus. No hydronephrosis or renal obstruction is noted. Electronically Signed   By: Marijo Conception M.D.   On: 04/30/2019 13:18   Ct Abdomen Pelvis W Contrast  Result Date: 04/30/2019 CLINICAL DATA:  Acute generalized abdominal pain. EXAM: CT ANGIOGRAPHY CHEST CT ABDOMEN AND PELVIS WITH CONTRAST TECHNIQUE: Multidetector CT imaging of the chest was performed using the standard protocol during bolus administration of intravenous contrast. Multiplanar CT image reconstructions and MIPs were obtained to evaluate the vascular anatomy. Multidetector CT imaging of the abdomen and pelvis was performed using the standard protocol during bolus administration of intravenous contrast. CONTRAST:  131m OMNIPAQUE IOHEXOL 350 MG/ML SOLN COMPARISON:  PET scan of February 11, 2019. CT scan of January 30, 2019. FINDINGS: CTA CHEST FINDINGS Cardiovascular: Satisfactory opacification of the pulmonary arteries to the segmental level. No evidence of  pulmonary embolism. Mild cardiomegaly. No pericardial effusion. Mediastinum/Nodes: No enlarged mediastinal, hilar, or axillary lymph nodes. Thyroid gland, trachea, and esophagus demonstrate no significant findings. Lungs/Pleura: No pneumothorax is noted. Minimal left pleural effusion is noted with adjacent subsegmental atelectasis of the left lower lobe. New 1 cm subpleural nodule is noted in the right upper lobe concerning for possible metastatic disease. Mild lingular subsegmental atelectasis or infiltrate is noted. Musculoskeletal: No chest wall abnormality. No acute or significant osseous findings. Review of the MIP images confirms the above findings. CT ABDOMEN and PELVIS FINDINGS Hepatobiliary: Status post cholecystectomy. Stable intrahepatic and extrahepatic biliary dilatation is noted. Probable stones are noted in the distal common bile duct consistent with choledocholithiasis. Stable hepatic cysts are noted compared to prior exam. Pancreas: Unremarkable. No pancreatic ductal dilatation or surrounding inflammatory changes. Spleen: Normal in size without focal abnormality. Adrenals/Urinary Tract: Adrenal glands appear normal. Nonobstructive left renal calculus is noted. No hydronephrosis or renal obstruction is noted. Stable right renal cysts are noted. Urinary bladder is unremarkable. Stomach/Bowel: Stomach appears normal. There is no evidence of bowel obstruction or inflammation. The appendix is not visualized. Vascular/Lymphatic: No significant vascular abnormality is noted. Stable celiac lymph node is noted measuring 11 mm. Interval development of 16 mm lymph node in pre aortic space concerning for metastatic disease. Reproductive: There is interval development of large amount of fluid within the uterus that measures 20 x 8 cm. There is  some high density material within this suggesting possible hematoma with small amount of gas present suggesting possible infection or abscess. Probable hydrosalpinx is  seen on the right. No left adnexal abnormality is noted. Other: No abdominal wall hernia or abnormality. No abdominopelvic ascites. Musculoskeletal: No acute or significant osseous findings. Review of the MIP images confirms the above findings. IMPRESSION: No definite evidence of pulmonary embolus. Interval development of foot appears to be large amount of complex fluid and gas within the uterus which measures 20 x 8 cm. Some high density material is noted suggesting hemorrhage, with some gas present is well suggesting possible infection or abscess. Clinical correlation is recommended. New 1 cm right upper lobe pulmonary nodule is noted concerning for metastatic disease. New 16 mm preaortic lymph node is noted in the retroperitoneal space concerning for metastatic disease. Minimal left pleural effusion is noted with adjacent subsegmental atelectasis of the left lower lobe. Stable intrahepatic and extrahepatic biliary dilatation is noted secondary to choledocholithiasis. Nonobstructive left renal calculus. No hydronephrosis or renal obstruction is noted. Electronically Signed   By: Marijo Conception M.D.   On: 04/30/2019 13:18   Ct Head Code Stroke Wo Contrast  Result Date: 04/30/2019 CLINICAL DATA:  Code stroke. Difficulty breathing, last seen normal earlier this morning, altered mental status. No obvious weakness of arms or legs. Expressive aphasia. Atrial fibrillation. Uterine sarcoma. EXAM: CT HEAD WITHOUT CONTRAST TECHNIQUE: Contiguous axial images were obtained from the base of the skull through the vertex without intravenous contrast. COMPARISON:  None. FINDINGS: Significant motion degradation. Study is of reduced diagnostic sensitivity and specificity. Brain: No evidence for acute infarction, hemorrhage, mass lesion, hydrocephalus, or extra-axial fluid. Generalized atrophy. Hypoattenuation of white matter, likely small vessel disease. Vascular: No definite hypertensive vessel. Skull: No definite destructive  lesion. Large osteoma, RIGHT occipital bone, projecting exterior. Sinuses/Orbits: No gross sinus fluid.  Negative orbits. Other: None. ASPECTS [facility] Stroke Program Early CT Score) - Ganglionic level infarction (caudate, lentiform nuclei, internal capsule, insula, M1-M3 cortex): 7 - Supraganglionic infarction (M4-M6 cortex): 3 Total score (0-10 with 10 being normal): 10 IMPRESSION: 1. Suboptimal study demonstrating no definite acute stroke or large vessel occlusion. Diagnostic accuracy is limited however given the motion degradation. 2. ASPECTS is 10. 3. These results were communicated to Dr. Lorraine Lax at 12:46 pmon 4/30/2020by text page via the [facility] messaging system. Electronically Signed   By: Staci Righter M.D.   On: 04/30/2019 12:46    Pending Labs Unresulted Labs (From admission, onward)    Start     Ordered   05/01/19 0500  Hemoglobin A1c  Tomorrow morning,   R     04/30/19 1529   05/01/19 0500  Lipid panel  Tomorrow morning,   R    Comments:  Fasting    04/30/19 1529   04/30/19 1347  Blood gas, venous  Once,   STAT     04/30/19 1346   04/30/19 1218  Urine rapid drug screen (hosp performed)  ONCE - STAT,   STAT     04/30/19 1218   04/30/19 1218  Urinalysis, Routine w reflex microscopic  ONCE - STAT,   STAT     04/30/19 1218          Vitals/Pain Today's Vitals   04/30/19 1445 04/30/19 1500 04/30/19 1515 04/30/19 1530  BP: 127/80 116/79 129/89 119/87  Pulse: (!) 130 (!) 142 (!) 131 (!) 139  Resp: 20 (!) _0 Temp:      TempSrc:  SpO2: 100% 100% 100% 100%  Weight:      Height:        Isolation Precautions No active isolations  Medications Medications   stroke: mapping our early stages of recovery book (has no administration in time range)  0.9 %  sodium chloride infusion (has no administration in time range)  acetaminophen (TYLENOL) tablet 650 mg (has no administration in time range)    Or  acetaminophen (TYLENOL) solution 650 mg (has no administration in time  range)    Or  acetaminophen (TYLENOL) suppository 650 mg (has no administration in time range)  senna-docusate (Senokot-S) tablet 1 tablet (has no administration in time range)  iohexol (OMNIPAQUE) 350 MG/ML injection 100 mL (100 mLs Intravenous Contrast Given 04/30/19 1254)  metoprolol tartrate (LOPRESSOR) injection 2.5 mg (2.5 mg Intravenous Given 04/30/19 1419)    Mobility  Low fall risk   Focused Assessments Neuro Assessment Handoff:  Swallow screen pass? No    NIH Stroke Scale ( + Modified Stroke Scale Criteria)  Interval: Initial Level of Consciousness (1a.)   : Alert, keenly responsive LOC Questions (1b. )   +: Answers neither question correctly LOC Commands (1c. )   + : Performs both tasks correctly Best Gaze (2. )  +: Normal Visual (3. )  +: No visual loss Facial Palsy (4. )    : Normal symmetrical movements Motor Arm, Left (5a. )   +: No drift Motor Arm, Right (5b. )   +: No drift Motor Leg, Left (6a. )   +: No drift Motor Leg, Right (6b. )   +: No drift Limb Ataxia (7. ): Absent Sensory (8. )   +: Normal, no sensory loss Best Language (9. )   +: Mute, global aphasia Dysarthria (10. ): Normal Extinction/Inattention (11.)   +: No Abnormality Modified SS Total  +: 5 Complete NIHSS TOTAL: 5 Last date known well: 04/30/19 Last time known well: 0415 Neuro Assessment:   Neuro Checks:   Initial (04/30/19 1215)  Last Documented NIHSS Modified Score: 5 (04/30/19 1515) Has TPA been given? No If patient is a Neuro Trauma and patient is going to OR before floor call report to [facility] nurse: [phone] or [phone]     R Recommendations: See Admitting Provider Note  Report given to:   Additional Notes:

## 2019-04-30 NOTE — ED Provider Notes (Signed)
MSE was initiated and I personally evaluated the patient and placed orders (if any) at  12:26 PM on April 30, 2019.  The patient appears stable so that the remainder of the MSE may be completed by another provider.  Code stroke on arrival. No obvious weakness of arms or legs. Expressive aphasia. CT head without acute gross abnormality. Not a candidate for TPA given LKW at 430am.    Jola Schmidt, MD 04/30/19 1227

## 2019-04-30 NOTE — ED Notes (Addendum)
This RN acting as Art therapist and reached out to pt's dtr, Neoma Laming. Pt came in as code stroke/ mute. RN asked pt's baseline and ambulatory status. Pt dtr states she normally can talk, is oriented, and ambulates with a cane. Pt has been feeling poorly since her last chemo treatment last week and had a fall. Pt's other dtr, Marlowe Kays last saw her at 4am today,  And the pt was able to say "yes". At 7am, pt was nonverbal.  Pt's dtr Neoma Laming requested that Gwen Pounds, pt's other dtr who is POA be contacted for any questions on meds, insurance, medical decisions. Notified pt's dtr that pt has a bed on 3[address] and gave her the number to call and check on pt.

## 2019-04-30 NOTE — ED Provider Notes (Signed)
[facility] [place] [facility] EMERGENCY DEPARTMENT Provider Note   CSN: [id number] Arrival date & time: 04/30/19  1215  An emergency department physician performed an initial assessment on this suspected stroke patient at 1215.  History   Chief Complaint No chief complaint on file.   HPI [name] is a 85 y.o. female.     Patient with history of colon cancer, currently being treated for uterine cancer, atrial fibrillation on Coumadin, arthritis presents with altered mental status.  Last known normal 430 this morning.  Patient had expressive aphasia and general weakness.  Patient unable to provide details.     Past Medical History:  Diagnosis Date   Arthritis    Arthritis pain    Atrial fibrillation ([place])    Cancer ([place])    colon, 1996   Constipation    Environmental allergies    Hemochromatosis 1998   History of colon cancer    HTN (hypertension)    Hx of echocardiogram    Echo (8/15):  EF 45-50%, diff HK, mild AI, MAC, mild to mod MR, severe LAE, mod RAE, PASP 32 mmHg   PONV (postoperative nausea and vomiting)    Thyroid disease    Varicose vein of leg     Patient Active Problem List   Diagnosis Date Noted   Acute endometritis 04/30/2019   Encephalopathy acute 04/30/2019   History of colon cancer 02/17/2019   Goals of care, counseling/discussion 02/17/2019   Acute back pain 02/17/2019   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   Pneumonia of right middle lobe due to infectious organism [facility])    Mitral regurgitation 07/13/2014   Encounter for therapeutic drug monitoring 03/15/2014   Reactive airway disease 04/24/2013   Pre-operative cardiovascular examination 03/03/2012   Long term (current) use of anticoagulants 09/20/2011   Chronic atrial fibrillation 09/13/2011    Past Surgical History:  Procedure Laterality Date   BREAST EXCISIONAL BIOPSY Right 1991   benign   BREAST SURGERY  1991   Rt br lump removed-benign   COLON SURGERY   1996   CANCER REMOVAL   GALLBLADDER SURGERY  2005   LIVER BIOPSY  04/08/2012   Procedure: LIVER BIOPSY;  Surgeon: Adin Hector, MD;  Location: [facility];  Service: General;;  laparoscopic   TOTAL HIP ARTHROPLASTY  2004   RIGHT     OB History   No obstetric history on file.      Home Medications    Prior to Admission medications   Medication Sig Start Date End Date Taking? Authorizing Provider  atorvastatin (LIPITOR) 10 MG tablet Take 10 mg by mouth Daily.    Yes [provider]  furosemide (LASIX) 20 MG tablet Take 20 mg by mouth daily as needed for edema.  08/22/11  Yes [provider]  gabapentin (NEURONTIN) 100 MG capsule Take 100 mg by mouth at bedtime.  04/11/15  Yes [provider]  isosorbide mononitrate (IMDUR) 30 MG 24 hr tablet Take 0.5 tablets (15 mg total) by mouth daily. 02/18/19  Yes Imogene Burn, PA-C  levothyroxine (SYNTHROID, LEVOTHROID) 50 MCG tablet Take 50 mcg by mouth Daily.    Yes [provider]  lisinopril (PRINIVIL,ZESTRIL) 2.5 MG tablet Take 2.5 mg by mouth daily. 11/12/18  Yes [provider]  meclizine (ANTIVERT) 25 MG tablet Take 25 mg by mouth 2 (two) times daily as needed for dizziness.    Yes [provider]  metoprolol succinate (TOPROL XL) 25 MG 24 hr tablet Take 1 tablet (  25 mg total) by mouth daily. 05/20/15  Yes Burnell Blanks, MD  omeprazole (PRILOSEC) 20 MG capsule Take 20 mg by mouth daily.   Yes [provider]  ondansetron (ZOFRAN) 4 MG tablet Take 4 mg by mouth 2 (two) times daily as needed for nausea/vomiting. 04/21/19  Yes [provider]  pantoprazole (PROTONIX) 40 MG tablet  03/11/19  Yes [provider]  warfarin (COUMADIN) 3 MG tablet TAKE AS DIRECTED BY COUMADIN CLINIC Patient taking differently: Take 1.5 mg by mouth at bedtime.  12/22/18  Yes Burnell Blanks, MD    Family History Family History  Problem Relation Age of Onset   Heart  disease Mother    Stroke Mother    Heart disease Father    Pneumonia Brother    Hypertension Maternal Grandfather    Heart attack Neg Hx    Breast cancer Neg Hx     Social History Social History   Tobacco Use   Smoking status: Never Smoker   Smokeless tobacco: Never Used  Substance Use Topics   Alcohol use: No   Drug use: No     Allergies   Adhesive [tape]; Cardizem [diltiazem hcl]; and Latex   Review of Systems Review of Systems  Unable to perform ROS: Acuity of condition     Physical Exam Updated Vital Signs BP 118/82    Pulse (!) 135    Temp 98.7 F (37.1 C) (Oral)    Resp 19    Ht _0  (1.6 m)    Wt 74.3 kg    SpO2 100%    BMI 29.02 kg/m   Physical Exam Vitals signs and nursing note reviewed.  Constitutional:      Appearance: She is well-developed.  HENT:     Head: Normocephalic and atraumatic.     Comments: Dry mucous membranes Eyes:     General:        Right eye: No discharge.        Left eye: No discharge.     Conjunctiva/sclera: Conjunctivae normal.  Neck:     Musculoskeletal: Normal range of motion.     Trachea: No tracheal deviation.  Cardiovascular:     Rate and Rhythm: Tachycardia present. Rhythm irregular.  Pulmonary:     Effort: Pulmonary effort is normal.     Breath sounds: Normal breath sounds.  Abdominal:     General: There is no distension.     Palpations: Abdomen is soft.     Tenderness: There is abdominal tenderness (central). There is no guarding.  Skin:    General: Skin is warm.     Findings: No rash.  Neurological:     Mental Status: She is alert.     Comments: Generalized weakness, patient has no unilateral deficits on exam.  Patient will follow majority of commands.  Expressive aphasia on exam.  Pupils equal bilateral.  Difficult exam due to aphasia and presentation.  Psychiatric:        Behavior: Behavior is not aggressive.     Comments: General confusion      ED Treatments / Results  Labs (all labs  ordered are listed, but only abnormal results are displayed) Labs Reviewed  PROTIME-INR - Abnormal; Notable for the following components:      Result Value   Prothrombin Time 15.4 (*)    All other components within normal limits  CBC - Abnormal; Notable for the following components:   WBC 16.1 (*)    RBC 3.73 (*)  Hemoglobin 11.6 (*)    MCV 103.5 (*)    All other components within normal limits  DIFFERENTIAL - Abnormal; Notable for the following components:   Neutro Abs 13.8 (*)    Monocytes Absolute 1.1 (*)    Abs Immature Granulocytes 0.11 (*)    All other components within normal limits  COMPREHENSIVE METABOLIC PANEL - Abnormal; Notable for the following components:   Total Protein 5.7 (*)    Albumin 2.3 (*)    Total Bilirubin 1.6 (*)    GFR calc non Af Amer 59 (*)    All other components within normal limits  POCT I-STAT EG7 - Abnormal; Notable for the following components:   pH, Ven 7.506 (*)    pCO2, Ven 33.8 (*)    pO2, Ven 61.0 (*)    Acid-Base Excess 4.0 (*)    Sodium 134 (*)    HCT 33.0 (*)    Hemoglobin 11.2 (*)    All other components within normal limits  ETHANOL  APTT  AMMONIA  TSH  RAPID URINE DRUG SCREEN, HOSP PERFORMED  URINALYSIS, ROUTINE W REFLEX MICROSCOPIC  BLOOD GAS, VENOUS  I-STAT CREATININE, ED    EKG EKG Interpretation  Date/Time:  Thursday April 30 2019 12:52:55 EDT Ventricular Rate:  149 PR Interval:    QRS Duration: 83 QT Interval:  298 QTC Calculation: 470 R Axis:   -60 Text Interpretation:  Atrial fibrillation with rapid V-rate Left anterior fascicular block Low voltage, extremity leads Abnormal R-wave progression, late transition Confirmed by Elnora Morrison [phone]) on 04/30/2019 1:03:31 PM   Radiology Ct Angio Chest Pe W And/or Wo Contrast  Result Date: 04/30/2019 CLINICAL DATA:  Acute generalized abdominal pain. EXAM: CT ANGIOGRAPHY CHEST CT ABDOMEN AND PELVIS WITH CONTRAST TECHNIQUE: Multidetector CT imaging of the chest was  performed using the standard protocol during bolus administration of intravenous contrast. Multiplanar CT image reconstructions and MIPs were obtained to evaluate the vascular anatomy. Multidetector CT imaging of the abdomen and pelvis was performed using the standard protocol during bolus administration of intravenous contrast. CONTRAST:  122m OMNIPAQUE IOHEXOL 350 MG/ML SOLN COMPARISON:  PET scan of February 11, 2019. CT scan of January 30, 2019. FINDINGS: CTA CHEST FINDINGS Cardiovascular: Satisfactory opacification of the pulmonary arteries to the segmental level. No evidence of pulmonary embolism. Mild cardiomegaly. No pericardial effusion. Mediastinum/Nodes: No enlarged mediastinal, hilar, or axillary lymph nodes. Thyroid gland, trachea, and esophagus demonstrate no significant findings. Lungs/Pleura: No pneumothorax is noted. Minimal left pleural effusion is noted with adjacent subsegmental atelectasis of the left lower lobe. New 1 cm subpleural nodule is noted in the right upper lobe concerning for possible metastatic disease. Mild lingular subsegmental atelectasis or infiltrate is noted. Musculoskeletal: No chest wall abnormality. No acute or significant osseous findings. Review of the MIP images confirms the above findings. CT ABDOMEN and PELVIS FINDINGS Hepatobiliary: Status post cholecystectomy. Stable intrahepatic and extrahepatic biliary dilatation is noted. Probable stones are noted in the distal common bile duct consistent with choledocholithiasis. Stable hepatic cysts are noted compared to prior exam. Pancreas: Unremarkable. No pancreatic ductal dilatation or surrounding inflammatory changes. Spleen: Normal in size without focal abnormality. Adrenals/Urinary Tract: Adrenal glands appear normal. Nonobstructive left renal calculus is noted. No hydronephrosis or renal obstruction is noted. Stable right renal cysts are noted. Urinary bladder is unremarkable. Stomach/Bowel: Stomach appears normal. There  is no evidence of bowel obstruction or inflammation. The appendix is not visualized. Vascular/Lymphatic: No significant vascular abnormality is noted. Stable celiac lymph node is  noted measuring 11 mm. Interval development of 16 mm lymph node in pre aortic space concerning for metastatic disease. Reproductive: There is interval development of large amount of fluid within the uterus that measures 20 x 8 cm. There is some high density material within this suggesting possible hematoma with small amount of gas present suggesting possible infection or abscess. Probable hydrosalpinx is seen on the right. No left adnexal abnormality is noted. Other: No abdominal wall hernia or abnormality. No abdominopelvic ascites. Musculoskeletal: No acute or significant osseous findings. Review of the MIP images confirms the above findings. IMPRESSION: No definite evidence of pulmonary embolus. Interval development of foot appears to be large amount of complex fluid and gas within the uterus which measures 20 x 8 cm. Some high density material is noted suggesting hemorrhage, with some gas present is well suggesting possible infection or abscess. Clinical correlation is recommended. New 1 cm right upper lobe pulmonary nodule is noted concerning for metastatic disease. New 16 mm preaortic lymph node is noted in the retroperitoneal space concerning for metastatic disease. Minimal left pleural effusion is noted with adjacent subsegmental atelectasis of the left lower lobe. Stable intrahepatic and extrahepatic biliary dilatation is noted secondary to choledocholithiasis. Nonobstructive left renal calculus. No hydronephrosis or renal obstruction is noted. Electronically Signed   By: Marijo Conception M.D.   On: 04/30/2019 13:18   Ct Abdomen Pelvis W Contrast  Result Date: 04/30/2019 CLINICAL DATA:  Acute generalized abdominal pain. EXAM: CT ANGIOGRAPHY CHEST CT ABDOMEN AND PELVIS WITH CONTRAST TECHNIQUE: Multidetector CT imaging of the  chest was performed using the standard protocol during bolus administration of intravenous contrast. Multiplanar CT image reconstructions and MIPs were obtained to evaluate the vascular anatomy. Multidetector CT imaging of the abdomen and pelvis was performed using the standard protocol during bolus administration of intravenous contrast. CONTRAST:  167m OMNIPAQUE IOHEXOL 350 MG/ML SOLN COMPARISON:  PET scan of February 11, 2019. CT scan of January 30, 2019. FINDINGS: CTA CHEST FINDINGS Cardiovascular: Satisfactory opacification of the pulmonary arteries to the segmental level. No evidence of pulmonary embolism. Mild cardiomegaly. No pericardial effusion. Mediastinum/Nodes: No enlarged mediastinal, hilar, or axillary lymph nodes. Thyroid gland, trachea, and esophagus demonstrate no significant findings. Lungs/Pleura: No pneumothorax is noted. Minimal left pleural effusion is noted with adjacent subsegmental atelectasis of the left lower lobe. New 1 cm subpleural nodule is noted in the right upper lobe concerning for possible metastatic disease. Mild lingular subsegmental atelectasis or infiltrate is noted. Musculoskeletal: No chest wall abnormality. No acute or significant osseous findings. Review of the MIP images confirms the above findings. CT ABDOMEN and PELVIS FINDINGS Hepatobiliary: Status post cholecystectomy. Stable intrahepatic and extrahepatic biliary dilatation is noted. Probable stones are noted in the distal common bile duct consistent with choledocholithiasis. Stable hepatic cysts are noted compared to prior exam. Pancreas: Unremarkable. No pancreatic ductal dilatation or surrounding inflammatory changes. Spleen: Normal in size without focal abnormality. Adrenals/Urinary Tract: Adrenal glands appear normal. Nonobstructive left renal calculus is noted. No hydronephrosis or renal obstruction is noted. Stable right renal cysts are noted. Urinary bladder is unremarkable. Stomach/Bowel: Stomach appears  normal. There is no evidence of bowel obstruction or inflammation. The appendix is not visualized. Vascular/Lymphatic: No significant vascular abnormality is noted. Stable celiac lymph node is noted measuring 11 mm. Interval development of 16 mm lymph node in pre aortic space concerning for metastatic disease. Reproductive: There is interval development of large amount of fluid within the uterus that measures 20 x 8 cm. There  is some high density material within this suggesting possible hematoma with small amount of gas present suggesting possible infection or abscess. Probable hydrosalpinx is seen on the right. No left adnexal abnormality is noted. Other: No abdominal wall hernia or abnormality. No abdominopelvic ascites. Musculoskeletal: No acute or significant osseous findings. Review of the MIP images confirms the above findings. IMPRESSION: No definite evidence of pulmonary embolus. Interval development of foot appears to be large amount of complex fluid and gas within the uterus which measures 20 x 8 cm. Some high density material is noted suggesting hemorrhage, with some gas present is well suggesting possible infection or abscess. Clinical correlation is recommended. New 1 cm right upper lobe pulmonary nodule is noted concerning for metastatic disease. New 16 mm preaortic lymph node is noted in the retroperitoneal space concerning for metastatic disease. Minimal left pleural effusion is noted with adjacent subsegmental atelectasis of the left lower lobe. Stable intrahepatic and extrahepatic biliary dilatation is noted secondary to choledocholithiasis. Nonobstructive left renal calculus. No hydronephrosis or renal obstruction is noted. Electronically Signed   By: Marijo Conception M.D.   On: 04/30/2019 13:18   Ct Head Code Stroke Wo Contrast  Result Date: 04/30/2019 CLINICAL DATA:  Code stroke. Difficulty breathing, last seen normal earlier this morning, altered mental status. No obvious weakness of arms or  legs. Expressive aphasia. Atrial fibrillation. Uterine sarcoma. EXAM: CT HEAD WITHOUT CONTRAST TECHNIQUE: Contiguous axial images were obtained from the base of the skull through the vertex without intravenous contrast. COMPARISON:  None. FINDINGS: Significant motion degradation. Study is of reduced diagnostic sensitivity and specificity. Brain: No evidence for acute infarction, hemorrhage, mass lesion, hydrocephalus, or extra-axial fluid. Generalized atrophy. Hypoattenuation of white matter, likely small vessel disease. Vascular: No definite hypertensive vessel. Skull: No definite destructive lesion. Large osteoma, RIGHT occipital bone, projecting exterior. Sinuses/Orbits: No gross sinus fluid.  Negative orbits. Other: None. ASPECTS [facility] Stroke Program Early CT Score) - Ganglionic level infarction (caudate, lentiform nuclei, internal capsule, insula, M1-M3 cortex): 7 - Supraganglionic infarction (M4-M6 cortex): 3 Total score (0-10 with 10 being normal): 10 IMPRESSION: 1. Suboptimal study demonstrating no definite acute stroke or large vessel occlusion. Diagnostic accuracy is limited however given the motion degradation. 2. ASPECTS is 10. 3. These results were communicated to Dr. Lorraine Lax at 12:46 pmon 4/30/2020by text page via the [facility] messaging system. Electronically Signed   By: Staci Righter M.D.   On: 04/30/2019 12:46    Procedures .Critical Care Performed by: Elnora Morrison, MD Authorized by: Elnora Morrison, MD   Critical care provider statement:    Critical care time (minutes):  40   Critical care start time:  04/30/2019 1:10 PM   Critical care end time:  04/30/2019 1:45 PM   Critical care time was exclusive of:  Separately billable procedures and treating other patients and teaching time   Critical care was necessary to treat or prevent imminent or life-threatening deterioration of the following conditions:  CNS failure or compromise   Critical care was time spent personally by me on the  following activities:  Discussions with consultants, evaluation of patient's response to treatment, examination of patient, ordering and performing treatments and interventions, ordering and review of laboratory studies, ordering and review of radiographic studies, pulse oximetry, re-evaluation of patient's condition, obtaining history from patient or surrogate and review of old charts   (including critical care time)  Medications Ordered in ED Medications   stroke: mapping our early stages of recovery book (has no administration in  time range)  0.9 %  sodium chloride infusion (has no administration in time range)  acetaminophen (TYLENOL) tablet 650 mg (has no administration in time range)    Or  acetaminophen (TYLENOL) solution 650 mg (has no administration in time range)    Or  acetaminophen (TYLENOL) suppository 650 mg (has no administration in time range)  senna-docusate (Senokot-S) tablet 1 tablet (has no administration in time range)  iohexol (OMNIPAQUE) 350 MG/ML injection 100 mL (100 mLs Intravenous Contrast Given 04/30/19 1254)  metoprolol tartrate (LOPRESSOR) injection 2.5 mg (2.5 mg Intravenous Given 04/30/19 1419)     Initial Impression / Assessment and Plan / ED Course  I have reviewed the triage vital signs and the nursing notes.  Pertinent labs & imaging results that were available during my care of the patient were reviewed by me and considered in my medical decision making (see chart for details).       Patient with cancer history and atrial fibrillation on metoprolol presents with acute encephalopathy.  Expressive aphasia on exam.  Patient taken immediately to CT scan, evaluation by neurology.  Reassessment after CT scan patient has expressive aphasia, general confusion.  Plan for IV metoprolol to help decrease heart rate for atrial fibrillation with rapid ventricular response.  Urinalysis pending, plan for in and out cath.  Blood work reviewed, overall unremarkable except  for leukocytosis 16,000 with a shift.  We will follow-up chest x-ray and urine testing.  VBG and ammonia added.  Concern clinically for possible stroke versus metastasis to the brain versus UTI versus other.  Paged hospitalist to help further evaluate the patient in the hospital.  The patients results and plan were reviewed and discussed.   Any x-rays performed were independently reviewed by myself.   Differential diagnosis were considered with the presenting HPI.  Medications   stroke: mapping our early stages of recovery book (has no administration in time range)  0.9 %  sodium chloride infusion (has no administration in time range)  acetaminophen (TYLENOL) tablet 650 mg (has no administration in time range)    Or  acetaminophen (TYLENOL) solution 650 mg (has no administration in time range)    Or  acetaminophen (TYLENOL) suppository 650 mg (has no administration in time range)  senna-docusate (Senokot-S) tablet 1 tablet (has no administration in time range)  iohexol (OMNIPAQUE) 350 MG/ML injection 100 mL (100 mLs Intravenous Contrast Given 04/30/19 1254)  metoprolol tartrate (LOPRESSOR) injection 2.5 mg (2.5 mg Intravenous Given 04/30/19 1419)    Vitals:   04/30/19 1515 04/30/19 1530 04/30/19 1545 04/30/19 1600  BP: 129/89 119/87 (!) 128/99 118/82  Pulse: (!) 131 (!) 139 (!) 139 (!) 135  Resp: _0 Temp:      TempSrc:      SpO2: 100% 100% 100% 100%  Weight:      Height:        Final diagnoses:  Atrial fibrillation with RVR ([place])  Acute encephalopathy    Admission/ observation were discussed with the admitting physician, patient and/or family and they are comfortable with the plan.    Final Clinical Impressions(s) / ED Diagnoses   Final diagnoses:  Atrial fibrillation with RVR ([place])  Acute encephalopathy    ED Discharge Orders    None       Elnora Morrison, MD 04/30/19 1616

## 2019-04-30 NOTE — ED Triage Notes (Signed)
Pt brought in by [facility]. pts daughter called due to pt has weakness and altered mental status. LKW at 0415 today. Pt has a history of uterine cancer and is currently receiving radiation. Pt started new meds for cancer a couple days ago and is now experiencing the altered mental status.   130/78 HR 120-140 afib rvr 100% NRB CBG 121

## 2019-04-30 NOTE — Consult Note (Signed)
Requesting Physician: Dr. Venora Maples    Chief Complaint: Not speaking   History obtained from: Patient and Chart    HPI:                                                                                                                                       [name] is an 85 y.o. female with past medical history of atrial fibrillation no longer on Coumadin due to uterine bleeding, stage 4 and metastatic uterine cancer, colon cancer and hypertension presents to the ER as a code stroke for aphasia.  Patient was last seen normal around 4 PM by her daughter who helped her go to the bathroom.  According to the daughter, she has been weak for the last 1-2 weeks's radiation therapy.  Her daughter went to work, her granddaughter checked on her at 38 AM and patient was sleeping.  Patient was supposed to go to an appointment but granddaughter had difficulty waking her up.  Patient was not speaking and EMS was called EMS who noted patient to be aphasic and have right arm weakness.   On arrival, patient remained nonverbal but would follow commands.  She was also hypoxic, tachycardic.  A stat CT head was done which ruled out hemorrhage.  tPA was not administered as patient was outside window.  CT angiogram was not performed as she would be a poor candidate for mechanical thrombectomy due to metastatic uterine cancer, uterine bleeding and debilitatation from radiation.   Further work-up in ED showed leukocytosis, possible uterine abscess as well as metastatic lesions, new pulmonary nodule lesion concerning for metastatic disease.  Date last known well: 04/30/19 Time last known well: 4am tPA Given: no, outside window  NIHSS: 8 Baseline MRS 2   Past Medical History:  Diagnosis Date  . Arthritis   . Arthritis pain   . Atrial fibrillation ([place])   . Cancer ([place])    colon, 1996  . Constipation   . Environmental allergies   . Hemochromatosis 1998  . History of colon cancer   . HTN (hypertension)   . Hx of  echocardiogram    Echo (8/15):  EF 45-50%, diff HK, mild AI, MAC, mild to mod MR, severe LAE, mod RAE, PASP 32 mmHg  . PONV (postoperative nausea and vomiting)   . Thyroid disease   . Varicose vein of leg     Past Surgical History:  Procedure Laterality Date  . BREAST EXCISIONAL BIOPSY Right 1991   benign  . BREAST SURGERY  1991   Rt br lump removed-benign  . COLON SURGERY  1996   CANCER REMOVAL  . GALLBLADDER SURGERY  2005  . LIVER BIOPSY  04/08/2012   Procedure: LIVER BIOPSY;  Surgeon: Adin Hector, MD;  Location: [facility];  Service: General;;  laparoscopic  . TOTAL HIP ARTHROPLASTY  2004   RIGHT    Family History  Problem Relation  Age of Onset  . Heart disease Mother   . Stroke Mother   . Heart disease Father   . Pneumonia Brother   . Hypertension Maternal Grandfather   . Heart attack Neg Hx   . Breast cancer Neg Hx    Social History:  reports that she has never smoked. She has never used smokeless tobacco. She reports that she does not drink alcohol or use drugs.  Allergies:  Allergies  Allergen Reactions  . Adhesive [Tape] Rash  . Cardizem [Diltiazem Hcl] Other (See Comments) and Cough    Rash that caused dark spotting   . Latex Rash and Other (See Comments)    Per Patient Latex=lip swelling    Medications:                                                                                                                        I reviewed home medications.  Per daughter no longer on Coumadin since January   ROS:                                                                                                                                     Limited Review of systems as patient is aphasic   Examination:                                                                                                      General: Appears obese  Psych: Affect appropriate to situation Eyes: No scleral injection HENT: No OP obstrucion Head: Normocephalic.  Cardiovascular: Normal  rate and regular rhythm.  Respiratory: increased  effort of breathing GI: Soft.  No distension. There is no tenderness.  Skin: WDI    Neurological Examination Mental Status: Awake, but nonverbal.  She is following simple commands. Cranial Nerves: II: Visual fields : blinks to threat bilaterally  III,IV, VI: ptosis not present, extra-ocular motions intact bilaterally, pupils equal, round, reactive to light and accommodation V,VII: smile symmetric, facial light touch sensation normal  bilaterally VIII: hearing normal bilaterally IX,X: uvula rises symmetrically XI: bilateral shoulder shrug XII: midline tongue extension Motor: Drift on either left upper or right upper extremity.  Lower extremities had at least 4 /5 strength Tone and bulk:normal tone throughout; no atrophy noted Sensory: Pinprick and light touch intact throughout, bilaterally Deep Tendon Reflexes: 1+ over bilateral patella Plantars: Right: downgoing   Left: downgoing Cerebellar: No gross ataxia  Gait: not tested      Lab Results: Basic Metabolic Panel: Recent Labs  Lab 04/30/19 1218 04/30/19 1223  NA 136  --   K 4.0  --   CL 99  --   CO2 24  --   GLUCOSE 91  --   BUN 17  --   CREATININE 0.88 0.50  CALCIUM 9.4  --     CBC: Recent Labs  Lab 04/30/19 1218  WBC 16.1*  NEUTROABS 13.8*  HGB 11.6*  HCT 38.6  MCV 103.5*  PLT 261    Coagulation Studies: Recent Labs    04/30/19 1218  LABPROT 15.4*  INR 1.2    Imaging: Ct Head Code Stroke Wo Contrast  Result Date: 04/30/2019 CLINICAL DATA:  Code stroke. Difficulty breathing, last seen normal earlier this morning, altered mental status. No obvious weakness of arms or legs. Expressive aphasia. Atrial fibrillation. Uterine sarcoma. EXAM: CT HEAD WITHOUT CONTRAST TECHNIQUE: Contiguous axial images were obtained from the base of the skull through the vertex without intravenous contrast. COMPARISON:  None. FINDINGS: Significant motion degradation. Study  is of reduced diagnostic sensitivity and specificity. Brain: No evidence for acute infarction, hemorrhage, mass lesion, hydrocephalus, or extra-axial fluid. Generalized atrophy. Hypoattenuation of white matter, likely small vessel disease. Vascular: No definite hypertensive vessel. Skull: No definite destructive lesion. Large osteoma, RIGHT occipital bone, projecting exterior. Sinuses/Orbits: No gross sinus fluid.  Negative orbits. Other: None. ASPECTS [facility] Stroke Program Early CT Score) - Ganglionic level infarction (caudate, lentiform nuclei, internal capsule, insula, M1-M3 cortex): 7 - Supraganglionic infarction (M4-M6 cortex): 3 Total score (0-10 with 10 being normal): 10 IMPRESSION: 1. Suboptimal study demonstrating no definite acute stroke or large vessel occlusion. Diagnostic accuracy is limited however given the motion degradation. 2. ASPECTS is 10. 3. These results were communicated to Dr. Lorraine Lax at 12:46 pmon 4/30/2020by text page via the [facility] messaging system. Electronically Signed   By: Staci Righter M.D.   On: 04/30/2019 12:46     ASSESSMENT AND PLAN   85 y.o. female with past medical history of atrial fibrillation no longer on Coumadin due to uterine bleeding, stage 4 and metastatic uterine cancer, colon cancer and hypertension presents to the ER as a code stroke for aphasia.  Not a candidate for any acute intervention.   Suspected acute ischemic stroke Metabolic encephalopathy secondary to sepsis, hypoxia,  Recommendations MRI brain to assess for acute stroke Recommend limited stroke work-up if positive as patient will likely not be able to tolerate anticoagulation Consider aspirin Palliative consult should be considered given her worsening metastatic uterine cancer Permissive hypertension Neurochecks N.p.o. until swallow screen   Sera Hitsman [facility] Pager Number [phone]

## 2019-04-30 NOTE — H&P (Signed)
History and Physical    [name] XFG:[id number] DOB: [birth year] DOA: 04/30/2019  PCP: Lorene Dy, MD  Patient coming from: Home  I have personally briefly reviewed patient's old medical records in [place]  Chief Complaint: Not speaking with altered mental status  HPI: [name] is a 85 y.o. female with medical history significant of stage IV uterine cancer and atrial fibrillation.  Anticoagulation was held in January due to uterine bleeding.  Also has hypertension and presented to the emergency department for code stroke.  And is unable to provide any history or details history is obtained from her daughter who stated that she had not been herself for the past couple of days but she woke up this morning unable to speak and therefore they activated EMS and have the patient come in.  Review of her records reveals that since January the patient has had a very complicated course she had wanted to have a hysterectomy however felt to be not indicated due to the severity of her uterine cancer and due to her "condition there was concern whether she could survive the procedure.  She then sought out chemotherapy but then never did get the chemotherapy and ultimately had some radiation treatments to try to stop the uterine bleeding.  Multiple conversations have been had with the patient and her daughter regarding palliative care and the possibility of moving onto hospice but since at the time she had not had known metastases patient and her daughter were hopeful to get some more time.  ED Course: Code stroke was called initial CT scan did not show any acute intracranial abnormalities blood cell count noted to be 16 she had some abdominal pain so a CT scan of the chest abdomen and pelvis was obtained she was found to have fluid in her uterus consistent with infection, mets to the right upper lobe and the preaortic lymph nodes and a left-sided pleural effusion.  Choledocholithiasis was stable.   She was referred to me for atrial fibrillation and acute encephalopathy rule out stroke.  Review of Systems: 5 caveat as patient is unable to speak  Past Medical History:  Diagnosis Date   Arthritis    Arthritis pain    Atrial fibrillation ([place])    Cancer ([place])    colon, 1996   Constipation    Environmental allergies    Hemochromatosis 1998   History of colon cancer    HTN (hypertension)    Hx of echocardiogram    Echo (8/15):  EF 45-50%, diff HK, mild AI, MAC, mild to mod MR, severe LAE, mod RAE, PASP 32 mmHg   PONV (postoperative nausea and vomiting)    Thyroid disease    Varicose vein of leg     Past Surgical History:  Procedure Laterality Date   BREAST EXCISIONAL BIOPSY Right 1991   benign   BREAST SURGERY  1991   Rt br lump removed-benign   COLON SURGERY  1996   CANCER REMOVAL   GALLBLADDER SURGERY  2005   LIVER BIOPSY  04/08/2012   Procedure: LIVER BIOPSY;  Surgeon: Adin Hector, MD;  Location: [facility];  Service: General;;  laparoscopic   TOTAL HIP ARTHROPLASTY  2004   RIGHT    Social History   Social History Narrative   Not on file     reports that she has never smoked. She has never used smokeless tobacco. She reports that she does not drink alcohol or use drugs.  Allergies  Allergen  Reactions   Adhesive [Tape] Rash   Cardizem [Diltiazem Hcl] Other (See Comments) and Cough    Rash that caused dark spotting    Latex Rash and Other (See Comments)    Per Patient Latex=lip swelling    Family History  Problem Relation Age of Onset   Heart disease Mother    Stroke Mother    Heart disease Father    Pneumonia Brother    Hypertension Maternal Grandfather    Heart attack Neg Hx    Breast cancer Neg Hx      Prior to Admission medications   Medication Sig Start Date End Date Taking? Authorizing Provider  warfarin (COUMADIN) 3 MG tablet TAKE AS DIRECTED BY COUMADIN CLINIC Patient taking differently: Take 1.5 mg by mouth at  bedtime.  12/22/18  Yes Burnell Blanks, MD  acetaminophen (TYLENOL) 325 MG tablet Take 325 mg by mouth every 6 (six) hours as needed for moderate pain or headache.    [provider]  amoxicillin-clavulanate (AUGMENTIN) 500-125 MG tablet  03/11/19   [provider]  atorvastatin (LIPITOR) 10 MG tablet Take 10 mg by mouth Daily.     [provider]  furosemide (LASIX) 20 MG tablet Take 20 mg by mouth daily as needed for edema.  08/22/11   [provider]  gabapentin (NEURONTIN) 100 MG capsule Take 100 mg by mouth at bedtime.  04/11/15   [provider]  isosorbide mononitrate (IMDUR) 30 MG 24 hr tablet Take 0.5 tablets (15 mg total) by mouth daily. 02/18/19   Imogene Burn, PA-C  levothyroxine (SYNTHROID, LEVOTHROID) 50 MCG tablet Take 50 mcg by mouth Daily.     [provider]  lisinopril (PRINIVIL,ZESTRIL) 2.5 MG tablet Take 2.5 mg by mouth daily. 11/12/18   [provider]  meclizine (ANTIVERT) 25 MG tablet Take 25 mg by mouth 2 (two) times daily as needed for dizziness.     [provider]  metoprolol succinate (TOPROL XL) 25 MG 24 hr tablet Take 1 tablet (25 mg total) by mouth daily. 05/20/15   Burnell Blanks, MD  omeprazole (PRILOSEC) 20 MG capsule Take 20 mg by mouth daily.    [provider]  pantoprazole (PROTONIX) 40 MG tablet  03/11/19   [provider]  polyethylene glycol (MIRALAX / GLYCOLAX) packet Take 17 g by mouth daily as needed for moderate constipation.     [provider]  senna (SENOKOT) 8.6 MG TABS Take 1 tablet by mouth daily as needed for moderate constipation.     [provider]  traMADol (ULTRAM) 50 MG tablet Take 25 mg by mouth every 6 (six) hours as needed for pain. 02/02/19   [provider]    Physical Exam:  Constitutional: NAD, calm, not appear uncomfortable but is somewhat distressed Vitals:   04/30/19 1442 04/30/19 1445 04/30/19  1500 04/30/19 1515  BP:  127/80 116/79 129/89  Pulse: (!) 133 (!) 130 (!) 142 (!) 131  Resp: 18 20 (!) 23 20  Temp:      TempSrc:      SpO2: 100% 100% 100% 100%  Weight:      Height:       Eyes: PERRL, lids and conjunctivae normal ENMT: Mucous membranes are moist. Posterior pharynx clear of any exudate or lesions.Normal dentition.  Neck: normal, supple, no masses, no thyromegaly Respiratory: clear to auscultation bilaterally, no wheezing, no crackles. Normal respiratory effort. No accessory muscle use.  Cardiovascular: Irregularly irregular rate and rhythm,  no murmurs / rubs / gallops. No extremity edema. 2+ pedal pulses. No carotid bruits.  Abdomen: no tenderness, no masses palpated. No hepatosplenomegaly. Bowel sounds positive.  Musculoskeletal: no clubbing / cyanosis. No joint deformity upper and lower extremities. Good ROM, no contractures. Normal muscle tone.  Skin: no rashes, lesions, ulcers. No induration Neurologic: Nonverbal sensation intact, DTR per reflexes on the right side. Strength 4/5 of the right upper extremity 3/5 of the left upper extremity 4/5 of the bilateral lower extremities Psychiatric: calm but unable to speak.  Not tearful but a little frustrated   Labs on Admission: I have personally reviewed following labs and imaging studies  CBC: Recent Labs  Lab 04/30/19 1218 04/30/19 1416  WBC 16.1*  --   NEUTROABS 13.8*  --   HGB 11.6* 11.2*  HCT 38.6 33.0*  MCV 103.5*  --   PLT 261  --    Basic Metabolic Panel: Recent Labs  Lab 04/30/19 1218 04/30/19 1223 04/30/19 1416  NA 136  --  134*  K 4.0  --  4.0  CL 99  --   --   CO2 24  --   --   GLUCOSE 91  --   --   BUN 17  --   --   CREATININE 0.88 0.50  --   CALCIUM 9.4  --   --    GFR: Estimated Creatinine Clearance: 47.9 mL/min (by C-G formula based on SCr of 0.5 mg/dL). Liver Function Tests: Recent Labs  Lab 04/30/19 1218  AST 15  ALT 12  ALKPHOS 88  BILITOT 1.6*  PROT 5.7*  ALBUMIN 2.3*     Recent Labs  Lab 04/30/19 1402  AMMONIA 17   Coagulation Profile: Recent Labs  Lab 04/30/19 1218  INR 1.2   Thyroid Function Tests: Recent Labs    04/30/19 1402  TSH 0.914   Urine analysis:    Component Value Date/Time   COLORURINE YELLOW 09/14/2018 [place] 09/14/2018 1415   LABSPEC 1.019 09/14/2018 1415   PHURINE 5.0 09/14/2018 1415   GLUCOSEU NEGATIVE 09/14/2018 1415   HGBUR MODERATE (A) 09/14/2018 1415   BILIRUBINUR NEGATIVE 09/14/2018 1415   KETONESUR 5 (A) 09/14/2018 1415   PROTEINUR NEGATIVE 09/14/2018 1415   UROBILINOGEN 0.2 08/28/2015 1712   NITRITE NEGATIVE 09/14/2018 1415   LEUKOCYTESUR NEGATIVE 09/14/2018 1415    Radiological Exams on Admission: Ct Angio Chest Pe W And/or Wo Contrast  Result Date: 04/30/2019 CLINICAL DATA:  Acute generalized abdominal pain. EXAM: CT ANGIOGRAPHY CHEST CT ABDOMEN AND PELVIS WITH CONTRAST TECHNIQUE: Multidetector CT imaging of the chest was performed using the standard protocol during bolus administration of intravenous contrast. Multiplanar CT image reconstructions and MIPs were obtained to evaluate the vascular anatomy. Multidetector CT imaging of the abdomen and pelvis was performed using the standard protocol during bolus administration of intravenous contrast. CONTRAST:  128m OMNIPAQUE IOHEXOL 350 MG/ML SOLN COMPARISON:  PET scan of February 11, 2019. CT scan of January 30, 2019. FINDINGS: CTA CHEST FINDINGS Cardiovascular: Satisfactory opacification of the pulmonary arteries to the segmental level. No evidence of pulmonary embolism. Mild cardiomegaly. No pericardial effusion. Mediastinum/Nodes: No enlarged mediastinal, hilar, or axillary lymph nodes. Thyroid gland, trachea, and esophagus demonstrate no significant findings. Lungs/Pleura: No pneumothorax is noted. Minimal left pleural effusion is noted with adjacent subsegmental atelectasis of the left lower lobe. New 1 cm subpleural nodule is noted in the  right upper lobe concerning for possible metastatic disease. Mild lingular subsegmental atelectasis or  infiltrate is noted. Musculoskeletal: No chest wall abnormality. No acute or significant osseous findings. Review of the MIP images confirms the above findings. CT ABDOMEN and PELVIS FINDINGS Hepatobiliary: Status post cholecystectomy. Stable intrahepatic and extrahepatic biliary dilatation is noted. Probable stones are noted in the distal common bile duct consistent with choledocholithiasis. Stable hepatic cysts are noted compared to prior exam. Pancreas: Unremarkable. No pancreatic ductal dilatation or surrounding inflammatory changes. Spleen: Normal in size without focal abnormality. Adrenals/Urinary Tract: Adrenal glands appear normal. Nonobstructive left renal calculus is noted. No hydronephrosis or renal obstruction is noted. Stable right renal cysts are noted. Urinary bladder is unremarkable. Stomach/Bowel: Stomach appears normal. There is no evidence of bowel obstruction or inflammation. The appendix is not visualized. Vascular/Lymphatic: No significant vascular abnormality is noted. Stable celiac lymph node is noted measuring 11 mm. Interval development of 16 mm lymph node in pre aortic space concerning for metastatic disease. Reproductive: There is interval development of large amount of fluid within the uterus that measures 20 x 8 cm. There is some high density material within this suggesting possible hematoma with small amount of gas present suggesting possible infection or abscess. Probable hydrosalpinx is seen on the right. No left adnexal abnormality is noted. Other: No abdominal wall hernia or abnormality. No abdominopelvic ascites. Musculoskeletal: No acute or significant osseous findings. Review of the MIP images confirms the above findings. IMPRESSION: No definite evidence of pulmonary embolus. Interval development of foot appears to be large amount of complex fluid and gas within the uterus  which measures 20 x 8 cm. Some high density material is noted suggesting hemorrhage, with some gas present is well suggesting possible infection or abscess. Clinical correlation is recommended. New 1 cm right upper lobe pulmonary nodule is noted concerning for metastatic disease. New 16 mm preaortic lymph node is noted in the retroperitoneal space concerning for metastatic disease. Minimal left pleural effusion is noted with adjacent subsegmental atelectasis of the left lower lobe. Stable intrahepatic and extrahepatic biliary dilatation is noted secondary to choledocholithiasis. Nonobstructive left renal calculus. No hydronephrosis or renal obstruction is noted. Electronically Signed   By: Marijo Conception M.D.   On: 04/30/2019 13:18   Ct Abdomen Pelvis W Contrast  Result Date: 04/30/2019 CLINICAL DATA:  Acute generalized abdominal pain. EXAM: CT ANGIOGRAPHY CHEST CT ABDOMEN AND PELVIS WITH CONTRAST TECHNIQUE: Multidetector CT imaging of the chest was performed using the standard protocol during bolus administration of intravenous contrast. Multiplanar CT image reconstructions and MIPs were obtained to evaluate the vascular anatomy. Multidetector CT imaging of the abdomen and pelvis was performed using the standard protocol during bolus administration of intravenous contrast. CONTRAST:  145m OMNIPAQUE IOHEXOL 350 MG/ML SOLN COMPARISON:  PET scan of February 11, 2019. CT scan of January 30, 2019. FINDINGS: CTA CHEST FINDINGS Cardiovascular: Satisfactory opacification of the pulmonary arteries to the segmental level. No evidence of pulmonary embolism. Mild cardiomegaly. No pericardial effusion. Mediastinum/Nodes: No enlarged mediastinal, hilar, or axillary lymph nodes. Thyroid gland, trachea, and esophagus demonstrate no significant findings. Lungs/Pleura: No pneumothorax is noted. Minimal left pleural effusion is noted with adjacent subsegmental atelectasis of the left lower lobe. New 1 cm subpleural nodule is  noted in the right upper lobe concerning for possible metastatic disease. Mild lingular subsegmental atelectasis or infiltrate is noted. Musculoskeletal: No chest wall abnormality. No acute or significant osseous findings. Review of the MIP images confirms the above findings. CT ABDOMEN and PELVIS FINDINGS Hepatobiliary: Status post cholecystectomy. Stable intrahepatic and extrahepatic biliary dilatation is  noted. Probable stones are noted in the distal common bile duct consistent with choledocholithiasis. Stable hepatic cysts are noted compared to prior exam. Pancreas: Unremarkable. No pancreatic ductal dilatation or surrounding inflammatory changes. Spleen: Normal in size without focal abnormality. Adrenals/Urinary Tract: Adrenal glands appear normal. Nonobstructive left renal calculus is noted. No hydronephrosis or renal obstruction is noted. Stable right renal cysts are noted. Urinary bladder is unremarkable. Stomach/Bowel: Stomach appears normal. There is no evidence of bowel obstruction or inflammation. The appendix is not visualized. Vascular/Lymphatic: No significant vascular abnormality is noted. Stable celiac lymph node is noted measuring 11 mm. Interval development of 16 mm lymph node in pre aortic space concerning for metastatic disease. Reproductive: There is interval development of large amount of fluid within the uterus that measures 20 x 8 cm. There is some high density material within this suggesting possible hematoma with small amount of gas present suggesting possible infection or abscess. Probable hydrosalpinx is seen on the right. No left adnexal abnormality is noted. Other: No abdominal wall hernia or abnormality. No abdominopelvic ascites. Musculoskeletal: No acute or significant osseous findings. Review of the MIP images confirms the above findings. IMPRESSION: No definite evidence of pulmonary embolus. Interval development of foot appears to be large amount of complex fluid and gas within  the uterus which measures 20 x 8 cm. Some high density material is noted suggesting hemorrhage, with some gas present is well suggesting possible infection or abscess. Clinical correlation is recommended. New 1 cm right upper lobe pulmonary nodule is noted concerning for metastatic disease. New 16 mm preaortic lymph node is noted in the retroperitoneal space concerning for metastatic disease. Minimal left pleural effusion is noted with adjacent subsegmental atelectasis of the left lower lobe. Stable intrahepatic and extrahepatic biliary dilatation is noted secondary to choledocholithiasis. Nonobstructive left renal calculus. No hydronephrosis or renal obstruction is noted. Electronically Signed   By: Marijo Conception M.D.   On: 04/30/2019 13:18   Ct Head Code Stroke Wo Contrast  Result Date: 04/30/2019 CLINICAL DATA:  Code stroke. Difficulty breathing, last seen normal earlier this morning, altered mental status. No obvious weakness of arms or legs. Expressive aphasia. Atrial fibrillation. Uterine sarcoma. EXAM: CT HEAD WITHOUT CONTRAST TECHNIQUE: Contiguous axial images were obtained from the base of the skull through the vertex without intravenous contrast. COMPARISON:  None. FINDINGS: Significant motion degradation. Study is of reduced diagnostic sensitivity and specificity. Brain: No evidence for acute infarction, hemorrhage, mass lesion, hydrocephalus, or extra-axial fluid. Generalized atrophy. Hypoattenuation of white matter, likely small vessel disease. Vascular: No definite hypertensive vessel. Skull: No definite destructive lesion. Large osteoma, RIGHT occipital bone, projecting exterior. Sinuses/Orbits: No gross sinus fluid.  Negative orbits. Other: None. ASPECTS [facility] Stroke Program Early CT Score) - Ganglionic level infarction (caudate, lentiform nuclei, internal capsule, insula, M1-M3 cortex): 7 - Supraganglionic infarction (M4-M6 cortex): 3 Total score (0-10 with 10 being normal): 10 IMPRESSION:  1. Suboptimal study demonstrating no definite acute stroke or large vessel occlusion. Diagnostic accuracy is limited however given the motion degradation. 2. ASPECTS is 10. 3. These results were communicated to Dr. Lorraine Lax at 12:46 pmon 4/30/2020by text page via the [facility] messaging system. Electronically Signed   By: Staci Righter M.D.   On: 04/30/2019 12:46    EKG: Independently reviewed.  Atrial fibrillation at 149 bpm with RVR  Assessment/Plan Principal Problem:   Encephalopathy acute Active Problems:   Chronic atrial fibrillation   Uterine sarcoma (HCC)   Acute endometritis   Goals of care,  counseling/discussion   1.  Acute encephalopathy likely due to acute stroke.  I suspect patient is having a stroke due to embolic phenomenon from her atrial fibrillation.  MRI has been ordered and is pending.  I have discussed the case with Dr. Lorraine Lax who agrees that conservative management is best.  Patient is likely not a candidate for anticoagulation aspirin may be the best option for her.  2.  Chronic atrial fibrillation: Currently not anticoagulated due to constant bleeding from uterine cancer.  Fortunately radiation has not stopped her bleeding.  3.  Stage IV uterine cancer now with mets to the lungs and para-aortic lymph nodes with a left-sided pleural effusion highly suspicious for malignant effusion.  At this point need to consider palliative measures to keep the patient comfortable.  Palliative care has been consulted.  I did discuss the case with the patient's daughter who is interested in hospice at home.  Should patient require hospice she would like to have hospice of [facility] consulted as they live in [place].  4.  Acute endometritis: Patient with fluid collection in the uterus.  Blood cell count is elevated all consistent with an acute endometritis.  Will start patient on ampicillin and clindamycin orally for treatment of acute endometritis for a 7-day course..  5.  Goals of  care counseling discussion: Patient's daughter Marlowe Kays is her power of attorney and is aware of the severity of the patient's condition.  She is requesting iteration for hospice care at home.  She is requesting oral antibiotics for treatment of the endometritis.  Further information once MRI is obtained.  DVT prophylaxis: SCDs Code Status: Do not attempt resuscitation Family Communication: Spoke to patient's daughter Marlowe Kays at length.  See above discussion Disposition Plan: Likely home in the next 48 hours with hospice of [facility] called: Neurology Dr. Lorraine Lax, palliative care Admission status: Inpatient  Admit - It is my clinical opinion that admission to INPATIENT is reasonable and necessary because of the expectation that this patient will require hospital care that crosses at least 2 midnights to treat this condition based on the medical complexity of the problems presented.  Given the aforementioned information, the predictability of an adverse outcome is felt to be significant.   Lady Deutscher MD FACP [facility] Pager [phone]  How to contact the [facility] Attending or Consulting provider [place] or covering provider during after hours [place], for this patient?  1. Check the care team in [facility] and look for a) attending/consulting [facility] provider listed and b) the [facility] team listed 2. Log into [email or web address] and use [place]'s universal password to access. If you do not have the password, please contact the hospital operator. 3. Locate the [facility] provider you are looking for under [facility] and page to a number that you can be directly reached. 4. If you still have difficulty reaching the provider, please page the [facility] (Director on Call) for the Hospitalists listed on amion for assistance.  If 7PM-7AM, please contact night-coverage [email or web address] Password [email or web address]  04/30/2019, 3:30 PM

## 2019-05-01 ENCOUNTER — Other Ambulatory Visit (HOSPITAL_COMMUNITY): Payer: Medicare Other

## 2019-05-01 ENCOUNTER — Encounter (HOSPITAL_COMMUNITY): Payer: Medicare Other

## 2019-05-01 DIAGNOSIS — Z7189 Other specified counseling: Secondary | ICD-10-CM

## 2019-05-01 DIAGNOSIS — I6340 Cerebral infarction due to embolism of unspecified cerebral artery: Secondary | ICD-10-CM

## 2019-05-01 DIAGNOSIS — Z66 Do not resuscitate: Secondary | ICD-10-CM

## 2019-05-01 DIAGNOSIS — I4891 Unspecified atrial fibrillation: Secondary | ICD-10-CM

## 2019-05-01 DIAGNOSIS — Z515 Encounter for palliative care: Secondary | ICD-10-CM

## 2019-05-01 DIAGNOSIS — C549 Malignant neoplasm of corpus uteri, unspecified: Secondary | ICD-10-CM

## 2019-05-01 LAB — URINALYSIS, ROUTINE W REFLEX MICROSCOPIC
Bilirubin Urine: NEGATIVE
Glucose, UA: NEGATIVE mg/dL
Ketones, ur: 5 mg/dL — AB
Nitrite: NEGATIVE
Protein, ur: >=300 mg/dL — AB
RBC / HPF: >50 RBC/hpf — ABNORMAL HIGH (ref 0–5)
Specific Gravity, Urine: >1.046 — ABNORMAL HIGH (ref 1.005–1.030)
pH: 6 (ref 5.0–8.0)

## 2019-05-01 LAB — RAPID URINE DRUG SCREEN, HOSP PERFORMED
Amphetamines: NOT DETECTED
Barbiturates: NOT DETECTED
Benzodiazepines: NOT DETECTED
Cocaine: NOT DETECTED
Opiates: NOT DETECTED
Tetrahydrocannabinol: NOT DETECTED

## 2019-05-01 LAB — HEMOGLOBIN A1C
Hgb A1c MFr Bld: 4.9 % (ref 4.8–5.6)
Mean Plasma Glucose: 93.93 mg/dL

## 2019-05-01 LAB — LIPID PANEL
Cholesterol: 78 mg/dL (ref 0–200)
HDL: 27 mg/dL — ABNORMAL LOW (ref >40)
LDL Cholesterol: 36 mg/dL (ref 0–99)
Total CHOL/HDL Ratio: 2.9 RATIO
Triglycerides: 77 mg/dL (ref <150)
VLDL: 15 mg/dL (ref 0–40)

## 2019-05-01 MED ORDER — SODIUM CHLORIDE 0.9 % IV BOLUS
500.0000 mL | Freq: Once | INTRAVENOUS | Status: AC
  Administered 2019-05-01: 04:00:00 500 mL via INTRAVENOUS

## 2019-05-01 MED ORDER — AMPICILLIN 500 MG PO CAPS: 500.0000 mg | ORAL_CAPSULE | Freq: Four times a day (QID) | ORAL | 0 refills | Status: AC

## 2019-05-01 MED ORDER — SODIUM CHLORIDE 0.9 % IV BOLUS
500.0000 mL | Freq: Once | INTRAVENOUS | Status: AC
  Administered 2019-05-01: 500 mL via INTRAVENOUS

## 2019-05-01 MED ORDER — DIGOXIN 0.25 MG/ML IJ SOLN
0.5000 mg | Freq: Once | INTRAMUSCULAR | Status: AC
  Administered 2019-05-01: 0.5 mg via INTRAVENOUS
  Filled 2019-05-01 (×2): qty 2

## 2019-05-01 MED ORDER — SODIUM CHLORIDE 0.9 % IV BOLUS
250.0000 mL | Freq: Once | INTRAVENOUS | Status: AC
  Administered 2019-05-01: 09:00:00 250 mL via INTRAVENOUS

## 2019-05-01 MED ORDER — METOPROLOL SUCCINATE ER 25 MG PO TB24
25.0000 mg | ORAL_TABLET | Freq: Every day | ORAL | Status: DC
  Administered 2019-05-01: 25 mg via ORAL
  Filled 2019-05-01: qty 1

## 2019-05-01 MED ORDER — METOPROLOL TARTRATE 5 MG/5ML IV SOLN
2.5000 mg | Freq: Once | INTRAVENOUS | Status: AC
  Administered 2019-05-01: 2.5 mg via INTRAVENOUS
  Filled 2019-05-01: qty 5

## 2019-05-01 MED ORDER — METOPROLOL TARTRATE 5 MG/5ML IV SOLN
5.0000 mg | Freq: Once | INTRAVENOUS | Status: AC
  Administered 2019-05-01: 5 mg via INTRAVENOUS
  Filled 2019-05-01: qty 5

## 2019-05-01 MED ORDER — DIGOXIN 0.25 MG/ML IJ SOLN
0.2500 mg | INTRAMUSCULAR | Status: AC
  Administered 2019-05-01: 0.25 mg via INTRAVENOUS
  Filled 2019-05-01: qty 1

## 2019-05-01 MED ORDER — METRONIDAZOLE 500 MG PO TABS: 500.0000 mg | ORAL_TABLET | Freq: Four times a day (QID) | ORAL | 0 refills | Status: AC

## 2019-05-01 NOTE — Progress Notes (Signed)
Tele reported HR in 150s. Dr. Marylyn Ishihara notified. BP 98/79. Dr. Marylyn Ishihara gave verbal orders to this nurse to give 500 ml bolus of NS. Nurse will continue to monitor. [place]

## 2019-05-01 NOTE — Evaluation (Signed)
Physical Therapy Evaluation Patient Details Name: [name] MRN: [id number] DOB: 12-30-1929 Today's Date: 05/01/2019   History of Present Illness  Pt is an 85 y/o female who presents with AMS and decreased verbal response. 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 12 mm meningioma along the left edge of the foramen magnum, without neural compression. PMH significant for thyroid disease, HTN, colon CA, Uterine CA, R THR.  Clinical Impression  Pt admitted with above diagnosis. Pt currently with functional limitations due to the deficits listed below (see PT Problem List). At the time of PT eval pt was able to perform transfers with RW and +2 assist mainly for safety. We were not able to progress to ambulation in room 2 fatigue after using BSC. Noted that pt will have hospital bed delivered hopefully today, however would also like pt to have access to a RW and wheelchair at home as tolerance for functional activity is low at this time. Acutely, pt will benefit from skilled PT to increase their independence and safety with mobility to allow discharge to the venue listed below.       Follow Up Recommendations Per CSW pt/family have decided on home with hospice services    Equipment Recommendations  Wheelchair (measurements PT);Rolling walker with 5" wheels    Recommendations for Other Services       Precautions / Restrictions Precautions Precautions: Fall Precaution Comments: Expressive aphasia Restrictions Weight Bearing Restrictions: No      Mobility  Bed Mobility Overal bed mobility: Needs Assistance Bed Mobility: Supine to Sit     Supine to sit: Min assist;Mod assist     General bed mobility comments: Therapist assisting with bed pad for scooting and trunk elevation to full sitting position.   Transfers Overall transfer level: Needs assistance Equipment used: Rolling walker (2  wheeled) Transfers: Sit to/from Stand Sit to Stand: Min assist;+2 safety/equipment         General transfer comment: VC's for hand placement on seated surface for safety. Pt moving slowly and was able to power-up to full stand with assist at gait belt. +2 for safety as pt performed SPT to/from [facility].   Ambulation/Gait                Stairs            Wheelchair Mobility    Modified Rankin (Stroke Patients Only) Modified Rankin (Stroke Patients Only) Pre-Morbid Rankin Score: Slight disability Modified Rankin: Moderately severe disability     Balance Overall balance assessment: Needs assistance Sitting-balance support: Feet supported;No upper extremity supported Sitting balance-Leahy Scale: Fair     Standing balance support: Bilateral upper extremity supported;During functional activity Standing balance-Leahy Scale: Poor Standing balance comment: Pt required UE support for all aspects of standing activity.                              Pertinent Vitals/Pain Pain Assessment: No/denies pain    Home Living Family/patient expects to be discharged to:: Private residence Living Arrangements: Spouse/significant other(Husband has Alzheimer's per chart review) Available Help at Discharge: Family;Available 24 hours/day Type of Home: House Home Access: Stairs to enter   [employer] of Steps: 3 Home Layout: One level Home Equipment: Walker - 2 wheels;Shower seat;Cane - single point      Prior Function           Comments: Pt  reports she uses a [facility] for ambulation and daughters check in on pt and husband.      Hand Dominance        Extremity/Trunk Assessment   Upper Extremity Assessment Upper Extremity Assessment: Defer to OT evaluation    Lower Extremity Assessment Lower Extremity Assessment: Generalized weakness    Cervical / Trunk Assessment Cervical / Trunk Assessment: Kyphotic  Communication   Communication: Expressive  difficulties  Cognition Arousal/Alertness: Awake/alert Behavior During Therapy: Flat affect Overall Cognitive Status: Impaired/Different from baseline Area of Impairment: Following commands;Safety/judgement;Awareness;Problem solving                       Following Commands: Follows one step commands consistently;Follows one step commands with increased time Safety/Judgement: Decreased awareness of deficits Awareness: Intellectual Problem Solving: Slow processing;Requires verbal cues        General Comments      Exercises     Assessment/Plan    PT Assessment Patient needs continued PT services  PT Problem List Decreased strength;Decreased activity tolerance;Decreased balance;Decreased mobility;Decreased coordination;Decreased knowledge of use of DME;Decreased safety awareness;Decreased knowledge of precautions;Decreased cognition       PT Treatment Interventions DME instruction;Gait training;Therapeutic activities;Therapeutic exercise;Neuromuscular re-education;Patient/family education;Stair training;Functional mobility training;Wheelchair mobility training    PT Goals (Current goals can be found in the Care Plan section)  Acute Rehab PT Goals Patient Stated Goal: Pt did not state goals. PT Goal Formulation: With patient Time For Goal Achievement: 05/15/19 Potential to Achieve Goals: Fair    Frequency Min 3X/week   Barriers to discharge        Co-evaluation               AM-PAC PT "6 Clicks" Mobility  Outcome Measure Help needed turning from your back to your side while in a flat bed without using bedrails?: A Little Help needed moving from lying on your back to sitting on the side of a flat bed without using bedrails?: A Little Help needed moving to and from a bed to a chair (including a wheelchair)?: A Little Help needed standing up from a chair using your arms (e.g., wheelchair or bedside chair)?: A Little Help needed to walk in hospital room?: A  Lot Help needed climbing 3-5 steps with a railing? : Total 6 Click Score: 15    End of Session Equipment Utilized During Treatment: Gait belt Activity Tolerance: Patient limited by fatigue Patient left: in chair;with call bell/phone within reach;with chair alarm set Nurse Communication: Mobility status PT Visit Diagnosis: Unsteadiness on feet (R26.81);Other symptoms and signs involving the nervous system (R29.898);Muscle weakness (generalized) (M62.81);Difficulty in walking, not elsewhere classified (R26.2)    Time: 3500-9381 PT Time Calculation (min) (ACUTE ONLY): 25 min   Charges:   PT Evaluation $PT Eval Moderate Complexity: 1 Mod          Rolinda Roan, PT, DPT Acute Rehabilitation Services Pager: [phone] Office: [phone]   Thelma Comp 05/01/2019, 12:49 PM

## 2019-05-01 NOTE — TOC Initial Note (Signed)
Transition of Care [facility]) - Initial/Assessment Note    Patient Details  Name: [name] MRN: [id number] Date of Birth: Oct 06, 1929  [facility]) CM/SW Contact:    Geralynn Ochs, LCSW Phone Number: 05/01/2019, 11:48 AM  Clinical Narrative:    CSW acknowledging consult for family requesting hospice of [facility]. CSW sent referral this morning, and patient has been accepted to go home with hospice services, per family request. Family is curious about therapy evaluations and what equipment the patient is safe to use at this time; CSW sent message to therapists assigned to patient today. Hospice is ordering a hospital bed for patient, hopeful to have delivered today. Any other equipment necessary can be ordered, as well. CSW to follow to transition patient home when equipment has been delivered to the home.               Expected Discharge Plan: Home w Hospice Care Barriers to Discharge: Continued Medical Work up, Equipment Delay   Patient Goals and CMS Choice Patient states their goals for this hospitalization and ongoing recovery are:: family wants to keep patient comfortable and at home      Expected Discharge Plan and Services Expected Discharge Plan: [place] Acute Care Choice: Hospice Living arrangements for the past 2 months: Single Family Home                                      Prior Living Arrangements/Services Living arrangements for the past 2 months: Single Family Home Lives with:: Self, Adult Children                   Activities of Daily Living      Permission Sought/Granted                  Emotional Assessment       Orientation: : Oriented to Self, Oriented to Place, Oriented to Situation Alcohol / Substance Use: Not Applicable Psych Involvement: No (comment)  Admission diagnosis:  Acute encephalopathy [G93.40] Atrial fibrillation with RVR ([place]) [I48.91] Patient Active Problem List   Diagnosis Date Noted  . Cerebral embolism with cerebral infarction 05/01/2019  . Acute endometritis 04/30/2019  . Encephalopathy acute 04/30/2019  . History of colon cancer 02/17/2019  . Goals of care, counseling/discussion 02/17/2019  . Acute back pain 02/17/2019  . Uterine sarcoma ([place]) 02/13/2019  . Pneumonia of right middle lobe due to infectious organism ([place])   . Mitral regurgitation 07/13/2014  . Encounter for therapeutic drug monitoring 03/15/2014  . Reactive airway disease 04/24/2013  . Pre-operative cardiovascular examination 03/03/2012  . Long term (current) use of anticoagulants 09/20/2011  . Chronic atrial fibrillation 09/13/2011   PCP:  Lorene Dy, MD Pharmacy:   [facility] [address], [place] [address] Phone: [phone] Fax: [phone]     Social Determinants of Health (SDOH) Interventions    Readmission Risk Interventions No flowsheet data found.

## 2019-05-01 NOTE — Progress Notes (Signed)
Dr. Marylyn Ishihara spoke with this nurse in the hallway to say 3w14 was getting hospice set up at home. HR still in 140s. Dr. Marylyn Ishihara ordered a one time dose of digoxin and another 557ml bolus of NS. Current BP is 82/56 (65). Nurse will continue to monitor. [place]

## 2019-05-01 NOTE — Progress Notes (Signed)
Pt still sustaining HR in 140s. Dr. Marylyn Ishihara notified in person. Pt started on Toprol XL. Nurse will continue to monitor. [place]

## 2019-05-01 NOTE — Progress Notes (Signed)
Patient is discharging home with hospice care. [facility] is here for transport. Called her daughter Marlowe Kays) and left a message stating her mother was leaving the hospital. All discharge paperwork sent with patient. All belongings sent with patient. [place]

## 2019-05-01 NOTE — TOC Transition Note (Signed)
Transition of Care [facility]) - CM/SW Discharge Note   Patient Details  Name: [name] MRN: [id number] Date of Birth: 10/23/1929  Transition [facility]) CM/SW Contact:  Pollie Friar, RN Phone Number: 05/01/2019, 4:42 PM   Clinical Narrative:    Pt discharging home with [facility]. Pt traveling by [facility]. Marlowe Kays (daughter) states DME has been delivered to the home. Bedside RN and Daughter aware Corey Harold has been called. Transport packet at [employer].    Final next level of care: Home w Hospice Care Barriers to Discharge: Barriers Resolved   Patient Goals and CMS Choice Patient states their goals for this hospitalization and ongoing recovery are:: family wants to keep patient comfortable and at home CMS Medicare.gov Compare Post Acute Care list provided to:: Patient Represenative (must comment) Choice offered to / list presented to : Patient  Discharge Placement                       Discharge Plan and Services     Post Acute Care Choice: Hospice                      [facility] Agency: [place] Date [place]: 05/01/19 Time [place]: [phone] Representative spoke with at [place]: Hiliary/ Cheri  Social Determinants of Health ([place]) Interventions     Readmission Risk Interventions No flowsheet data found.

## 2019-05-01 NOTE — Progress Notes (Addendum)
Marland Kitchen  PROGRESS NOTE    [name]  FGH:[id number] DOB: 08-May-1929 DOA: 04/30/2019 PCP: Lorene Dy, MD  Brief Narrative: 85 yo WF presenting with altered mental status. Hx of S4 utering CA and a fib. MR brain shows bilateral acute infarctions suggests embolic disease from the heart or ascending aorta. Family discussing option hospice options.    Assessment & Plan:   Principal Problem:   Encephalopathy acute Active Problems:   Chronic atrial fibrillation   Uterine sarcoma (HCC)   Goals of care, counseling/discussion   Acute endometritis   Cerebral embolism with cerebral infarction   Encephalopathy acute     - likely related to b/l CVA  Chronic atrial fibrillation     - known history, was on anticoagulation but had to stop d/t uterine bleeding     - metoprolol XL 25 mg @ home?; given PRN metoprolol here; exacerbated by current active issues     - remains in afib, becoming hypotensive, will add bolus and OT digoxin 0.25 IV; check 12-lead EKG     Uterine sarcoma (HCC)     - S4 w/ lung mets and pleural effusion that is most likely a malignant effusion     - palliative care consulted    Acute endometritis     - fluid collection seen in uterus     - ampicillin/clindamycin x 7days (Day 2)    Cerebral embolism with cerebral infarction     - Hx of CA, Hx of a fib     - neuro onboard, appreciate assistance     - atorvastatin, BP control     - not a candidate for anticoagulation     - ASA 81mg  per neurology     - no need for further imaging as she is not a candidate for anticoagulation  Goals of care, counseling/discussion     - long conversation with dtr, Marlowe Kays, they wish to have outpt hospice; I've notified palliative care team; SW has arranged for outpt [facility] with [facility]   DVT prophylaxis:  SCDs Code Status:  DNR Family Communication:  Disposition Plan:  Likely home with hospice in next 24-48h.  Consultants:   Neurology, Palliative Care  Procedures:     None  Antimicrobials:   Ampicillin 500mg  q6h, flagyl 500mg  q6h    Subjective: Remains in a fib. BPs ok. She denies complaints otherwise.  Objective: Vitals:   05/01/19 0035 05/01/19 0235 05/01/19 0412 05/01/19 0734  BP: (!) 111/55 (!) 94/56 104/74 116/81  Pulse: (!) 38 (!) 151 (!) 136 (!) 121  Resp: 15 18 18 18   Temp: 97.6 F (36.4 C) 97.8 F (36.6 C) (!) 97.5 F (36.4 C) 98 F (36.7 C)  TempSrc: Oral Oral Oral Oral  SpO2: 100% 96% 97% 100%  Weight:      Height:        Intake/Output Summary (Last 24 hours) at 05/01/2019 1012 Last data filed at 05/01/2019 0900 Gross per 24 hour  Intake 1109.6 ml  Output 250 ml  Net 859.6 ml   Filed Weights   04/30/19 1219 04/30/19 1259  Weight: 74.3 kg 74.3 kg    Examination:  General exam: 85 yo WF resting in bed in NAD  Respiratory system: decreased at bases, but normal rhythm and WOB Cardiovascular system: irregularly irregular, 1/6 SEM Gastrointestinal system: Abdomen is nondistended, soft and nontender. No organomegaly or masses felt. Normal bowel sounds heard. Central nervous system: nonverbal, but nods yes/no, some weakness of LUE Psychiatry: calm, nods yes/no  to questioning     Data Reviewed: I have personally reviewed following labs and imaging studies  CBC: Recent Labs  Lab 04/30/19 1218 04/30/19 1416  WBC 16.1*  --   NEUTROABS 13.8*  --   HGB 11.6* 11.2*  HCT 38.6 33.0*  MCV 103.5*  --   PLT 261  --    Basic Metabolic Panel: Recent Labs  Lab 04/30/19 1218 04/30/19 1223 04/30/19 1416  NA 136  --  134*  K 4.0  --  4.0  CL 99  --   --   CO2 24  --   --   GLUCOSE 91  --   --   BUN 17  --   --   CREATININE 0.88 0.50  --   CALCIUM 9.4  --   --    GFR: Estimated Creatinine Clearance: 47.9 mL/min (by C-G formula based on SCr of 0.5 mg/dL). Liver Function Tests: Recent Labs  Lab 04/30/19 1218  AST 15  ALT 12  ALKPHOS 88  BILITOT 1.6*  PROT 5.7*  ALBUMIN 2.3*   No results for input(s):  LIPASE, AMYLASE in the last 168 hours. Recent Labs  Lab 04/30/19 1402  AMMONIA 17   Coagulation Profile: Recent Labs  Lab 04/30/19 1218  INR 1.2   Cardiac Enzymes: No results for input(s): CKTOTAL, CKMB, CKMBINDEX, TROPONINI in the last 168 hours. BNP (last 3 results) No results for input(s): PROBNP in the last 8760 hours. HbA1C: Recent Labs    05/01/19 0311  HGBA1C 4.9   CBG: No results for input(s): GLUCAP in the last 168 hours. Lipid Profile: Recent Labs    05/01/19 0311  CHOL 78  HDL 27*  LDLCALC 36  TRIG 77  CHOLHDL 2.9   Thyroid Function Tests: Recent Labs    04/30/19 1402  TSH 0.914   Anemia Panel: No results for input(s): VITAMINB12, FOLATE, FERRITIN, TIBC, IRON, RETICCTPCT in the last 72 hours. Sepsis Labs: No results for input(s): PROCALCITON, LATICACIDVEN in the last 168 hours.  No results found for this or any previous visit (from the past 240 hour(s)).       Radiology Studies: Ct Angio Chest Pe W And/or Wo Contrast  Result Date: 04/30/2019 CLINICAL DATA:  Acute generalized abdominal pain. EXAM: CT ANGIOGRAPHY CHEST CT ABDOMEN AND PELVIS WITH CONTRAST TECHNIQUE: Multidetector CT imaging of the chest was performed using the standard protocol during bolus administration of intravenous contrast. Multiplanar CT image reconstructions and MIPs were obtained to evaluate the vascular anatomy. Multidetector CT imaging of the abdomen and pelvis was performed using the standard protocol during bolus administration of intravenous contrast. CONTRAST:  171mL OMNIPAQUE IOHEXOL 350 MG/ML SOLN COMPARISON:  PET scan of February 11, 2019. CT scan of January 30, 2019. FINDINGS: CTA CHEST FINDINGS Cardiovascular: Satisfactory opacification of the pulmonary arteries to the segmental level. No evidence of pulmonary embolism. Mild cardiomegaly. No pericardial effusion. Mediastinum/Nodes: No enlarged mediastinal, hilar, or axillary lymph nodes. Thyroid gland, trachea, and  esophagus demonstrate no significant findings. Lungs/Pleura: No pneumothorax is noted. Minimal left pleural effusion is noted with adjacent subsegmental atelectasis of the left lower lobe. New 1 cm subpleural nodule is noted in the right upper lobe concerning for possible metastatic disease. Mild lingular subsegmental atelectasis or infiltrate is noted. Musculoskeletal: No chest wall abnormality. No acute or significant osseous findings. Review of the MIP images confirms the above findings. CT ABDOMEN and PELVIS FINDINGS Hepatobiliary: Status post cholecystectomy. Stable intrahepatic and extrahepatic biliary dilatation is noted. Probable stones  are noted in the distal common bile duct consistent with choledocholithiasis. Stable hepatic cysts are noted compared to prior exam. Pancreas: Unremarkable. No pancreatic ductal dilatation or surrounding inflammatory changes. Spleen: Normal in size without focal abnormality. Adrenals/Urinary Tract: Adrenal glands appear normal. Nonobstructive left renal calculus is noted. No hydronephrosis or renal obstruction is noted. Stable right renal cysts are noted. Urinary bladder is unremarkable. Stomach/Bowel: Stomach appears normal. There is no evidence of bowel obstruction or inflammation. The appendix is not visualized. Vascular/Lymphatic: No significant vascular abnormality is noted. Stable celiac lymph node is noted measuring 11 mm. Interval development of 16 mm lymph node in pre aortic space concerning for metastatic disease. Reproductive: There is interval development of large amount of fluid within the uterus that measures 20 x 8 cm. There is some high density material within this suggesting possible hematoma with small amount of gas present suggesting possible infection or abscess. Probable hydrosalpinx is seen on the right. No left adnexal abnormality is noted. Other: No abdominal wall hernia or abnormality. No abdominopelvic ascites. Musculoskeletal: No acute or  significant osseous findings. Review of the MIP images confirms the above findings. IMPRESSION: No definite evidence of pulmonary embolus. Interval development of foot appears to be large amount of complex fluid and gas within the uterus which measures 20 x 8 cm. Some high density material is noted suggesting hemorrhage, with some gas present is well suggesting possible infection or abscess. Clinical correlation is recommended. New 1 cm right upper lobe pulmonary nodule is noted concerning for metastatic disease. New 16 mm preaortic lymph node is noted in the retroperitoneal space concerning for metastatic disease. Minimal left pleural effusion is noted with adjacent subsegmental atelectasis of the left lower lobe. Stable intrahepatic and extrahepatic biliary dilatation is noted secondary to choledocholithiasis. Nonobstructive left renal calculus. No hydronephrosis or renal obstruction is noted. Electronically Signed   By: Marijo Conception M.D.   On: 04/30/2019 13:18   Mr Brain Wo Contrast  Result Date: 04/30/2019 CLINICAL DATA:  History of colon cancer and uterine cancer. Atrial fibrillation. Altered mental status. Expressive aphasia and generalized weakness. EXAM: MRI HEAD WITHOUT CONTRAST TECHNIQUE: Multiplanar, multiecho pulse sequences of the brain and surrounding structures were obtained without intravenous contrast. COMPARISON:  Head CT same day FINDINGS: Brain: Diffusion imaging shows a punctate acute infarction in the subcortical white matter of the right posterior frontal region. There is acute infarction in the left posterior frontal region affecting a gyrus and the underlying white matter. This is probably the precentral gyrus. No evidence of swelling or hemorrhage. Elsewhere, brainstem shows chronic small-vessel change of the pons. There is a 1.2 Cm meningioma along the left edge of the foramen magnum without mass-effect upon the brainstem. Cerebral hemispheres show atrophy with mild chronic  small-vessel ischemic change of the white matter. No large vessel territory infarction. No intra-axial mass lesion, hemorrhage, hydrocephalus or extra-axial collection. Vascular: Major vessels at the base of the brain show flow. Skull and upper cervical spine: Negative except for a right calvarial osteoma as shown by CT. Sinuses/Orbits: Clear/normal Other: None IMPRESSION: Acute cortical infarction in the left posterior frontal region, probably the precentral gyrus. No swelling or hemorrhage. Punctate acute infarction in the subcortical white matter of the right posterior frontal lobe. Bilateral acute infarctions suggests embolic disease from the heart or ascending aorta. Chronic small-vessel ischemic changes of the pons and cerebral hemispheric white matter. 12 mm meningioma along the left edge of the foramen magnum, without neural compression. Electronically Signed  By: Nelson Chimes M.D.   On: 04/30/2019 18:23   Ct Abdomen Pelvis W Contrast  Result Date: 04/30/2019 CLINICAL DATA:  Acute generalized abdominal pain. EXAM: CT ANGIOGRAPHY CHEST CT ABDOMEN AND PELVIS WITH CONTRAST TECHNIQUE: Multidetector CT imaging of the chest was performed using the standard protocol during bolus administration of intravenous contrast. Multiplanar CT image reconstructions and MIPs were obtained to evaluate the vascular anatomy. Multidetector CT imaging of the abdomen and pelvis was performed using the standard protocol during bolus administration of intravenous contrast. CONTRAST:  184mL OMNIPAQUE IOHEXOL 350 MG/ML SOLN COMPARISON:  PET scan of February 11, 2019. CT scan of January 30, 2019. FINDINGS: CTA CHEST FINDINGS Cardiovascular: Satisfactory opacification of the pulmonary arteries to the segmental level. No evidence of pulmonary embolism. Mild cardiomegaly. No pericardial effusion. Mediastinum/Nodes: No enlarged mediastinal, hilar, or axillary lymph nodes. Thyroid gland, trachea, and esophagus demonstrate no significant  findings. Lungs/Pleura: No pneumothorax is noted. Minimal left pleural effusion is noted with adjacent subsegmental atelectasis of the left lower lobe. New 1 cm subpleural nodule is noted in the right upper lobe concerning for possible metastatic disease. Mild lingular subsegmental atelectasis or infiltrate is noted. Musculoskeletal: No chest wall abnormality. No acute or significant osseous findings. Review of the MIP images confirms the above findings. CT ABDOMEN and PELVIS FINDINGS Hepatobiliary: Status post cholecystectomy. Stable intrahepatic and extrahepatic biliary dilatation is noted. Probable stones are noted in the distal common bile duct consistent with choledocholithiasis. Stable hepatic cysts are noted compared to prior exam. Pancreas: Unremarkable. No pancreatic ductal dilatation or surrounding inflammatory changes. Spleen: Normal in size without focal abnormality. Adrenals/Urinary Tract: Adrenal glands appear normal. Nonobstructive left renal calculus is noted. No hydronephrosis or renal obstruction is noted. Stable right renal cysts are noted. Urinary bladder is unremarkable. Stomach/Bowel: Stomach appears normal. There is no evidence of bowel obstruction or inflammation. The appendix is not visualized. Vascular/Lymphatic: No significant vascular abnormality is noted. Stable celiac lymph node is noted measuring 11 mm. Interval development of 16 mm lymph node in pre aortic space concerning for metastatic disease. Reproductive: There is interval development of large amount of fluid within the uterus that measures 20 x 8 cm. There is some high density material within this suggesting possible hematoma with small amount of gas present suggesting possible infection or abscess. Probable hydrosalpinx is seen on the right. No left adnexal abnormality is noted. Other: No abdominal wall hernia or abnormality. No abdominopelvic ascites. Musculoskeletal: No acute or significant osseous findings. Review of the MIP  images confirms the above findings. IMPRESSION: No definite evidence of pulmonary embolus. Interval development of foot appears to be large amount of complex fluid and gas within the uterus which measures 20 x 8 cm. Some high density material is noted suggesting hemorrhage, with some gas present is well suggesting possible infection or abscess. Clinical correlation is recommended. New 1 cm right upper lobe pulmonary nodule is noted concerning for metastatic disease. New 16 mm preaortic lymph node is noted in the retroperitoneal space concerning for metastatic disease. Minimal left pleural effusion is noted with adjacent subsegmental atelectasis of the left lower lobe. Stable intrahepatic and extrahepatic biliary dilatation is noted secondary to choledocholithiasis. Nonobstructive left renal calculus. No hydronephrosis or renal obstruction is noted. Electronically Signed   By: Marijo Conception M.D.   On: 04/30/2019 13:18   Ct Head Code Stroke Wo Contrast  Result Date: 04/30/2019 CLINICAL DATA:  Code stroke. Difficulty breathing, last seen normal earlier this morning, altered mental status. No  obvious weakness of arms or legs. Expressive aphasia. Atrial fibrillation. Uterine sarcoma. EXAM: CT HEAD WITHOUT CONTRAST TECHNIQUE: Contiguous axial images were obtained from the base of the skull through the vertex without intravenous contrast. COMPARISON:  None. FINDINGS: Significant motion degradation. Study is of reduced diagnostic sensitivity and specificity. Brain: No evidence for acute infarction, hemorrhage, mass lesion, hydrocephalus, or extra-axial fluid. Generalized atrophy. Hypoattenuation of white matter, likely small vessel disease. Vascular: No definite hypertensive vessel. Skull: No definite destructive lesion. Large osteoma, RIGHT occipital bone, projecting exterior. Sinuses/Orbits: No gross sinus fluid.  Negative orbits. Other: None. ASPECTS [facility] Stroke Program Early CT Score) - Ganglionic level  infarction (caudate, lentiform nuclei, internal capsule, insula, M1-M3 cortex): 7 - Supraganglionic infarction (M4-M6 cortex): 3 Total score (0-10 with 10 being normal): 10 IMPRESSION: 1. Suboptimal study demonstrating no definite acute stroke or large vessel occlusion. Diagnostic accuracy is limited however given the motion degradation. 2. ASPECTS is 10. 3. These results were communicated to Dr. Lorraine Lax at 12:46 pmon 4/30/2020by text page via the [facility] messaging system. Electronically Signed   By: Staci Righter M.D.   On: 04/30/2019 12:46        Scheduled Meds:   stroke: mapping our early stages of recovery book   Does not apply Once   ampicillin  500 mg Oral Q6H   atorvastatin  10 mg Oral q1800   gabapentin  100 mg Oral QHS   levothyroxine  50 mcg Oral Q0600   metroNIDAZOLE  500 mg Oral Q6H   pantoprazole  40 mg Oral Daily   Continuous Infusions:  sodium chloride 75 mL/hr at 05/01/19 0614   sodium chloride       LOS: 1 day    Time spent: 40 minutes spent in the coordination of care today.     Jonnie Finner, DO [facility] Pager [phone]  If 7PM-7AM, please contact night-coverage [email or web address] Password [email or web address] 05/01/2019, 10:12 AM

## 2019-05-01 NOTE — Evaluation (Signed)
Speech Language Pathology Evaluation Patient Details Name: [name] MRN: [id number] DOB: [birth year] Today's Date: 05/01/2019 Time: 3559-7416 SLP Time Calculation (min) (ACUTE ONLY): 37 min  Problem List:  Patient Active Problem List   Diagnosis Date Noted  . Cerebral embolism with cerebral infarction 05/01/2019  . Acute endometritis 04/30/2019  . Encephalopathy acute 04/30/2019  . History of colon cancer 02/17/2019  . Goals of care, counseling/discussion 02/17/2019  . Acute back pain 02/17/2019  . Uterine sarcoma ([place]) 02/13/2019  . Pneumonia of right middle lobe due to infectious organism ([place])   . Mitral regurgitation 07/13/2014  . Encounter for therapeutic drug monitoring 03/15/2014  . Reactive airway disease 04/24/2013  . Pre-operative cardiovascular examination 03/03/2012  . Long term (current) use of anticoagulants 09/20/2011  . Chronic atrial fibrillation 09/13/2011   Past Medical History:  Past Medical History:  Diagnosis Date  . Arthritis   . Arthritis pain   . Atrial fibrillation ([place])   . Cancer ([place])    colon, 1996  . Constipation   . Environmental allergies   . Hemochromatosis 1998  . History of colon cancer   . HTN (hypertension)   . Hx of echocardiogram    Echo (8/15):  EF 45-50%, diff HK, mild AI, MAC, mild to mod MR, severe LAE, mod RAE, PASP 32 mmHg  . PONV (postoperative nausea and vomiting)   . Thyroid disease   . Varicose vein of leg    Past Surgical History:  Past Surgical History:  Procedure Laterality Date  . BREAST EXCISIONAL BIOPSY Right 1991   benign  . BREAST SURGERY  1991   Rt br lump removed-benign  . COLON SURGERY  1996   CANCER REMOVAL  . GALLBLADDER SURGERY  2005  . LIVER BIOPSY  04/08/2012   Procedure: LIVER BIOPSY;  Surgeon: Adin Hector, MD;  Location: [place];  Service: General;;  laparoscopic  . TOTAL HIP ARTHROPLASTY  2004   RIGHT   HPI:  Pt is an 85 y.o. female with medical history significant of stage IV uterine  cancer and atrial fibrillation. Pt's daughter reported that the pt had not been herself for the past couple of days and she woke up on 04/30/19 unable to speak. Multiple conversations have been had with the patient and her daughter regarding palliative care due uterine cancer and the possibility of moving onto hospice but since at the time she had not had known metastases patient and her daughter were hopeful to get some more time. MRI of the brain revealed acute cortical infarction in the left posterior frontal region. Punctate acute infarction in the subcortical white matter of the right posterior frontal lobe.   Assessment / Plan / Recommendation Clinical Impression  Pt's daughter, [name], reported that the pt did not have any speech, language, or cognitive deficits prior to admission. She stated that she and her sister both live next to the pt and intend on rotating supervision of the pt after discharge if necessary. Her language skills have improved significantly compared to that which was dmeonstrated upon admission. She now presents with mild expressive aphasia characterized by difficulty with word retrieval during conversation and with auditory comprehension of complex information. She was able to follow one and two-step commands and answer simple and complex yes/no questions but exhibited increased difficulty with comprehension of paragraph-level material. Expressively, she demonstrated mild difficulty with sentence repetition, automatic sequences, and sentence formulation. Phonemic paraphasias were infrequently demonstrated. She also presents with moderate dysarthria characterized by reduced articulatory precision  and a hoarse vocal quality. Skilled SLP services are clinically indicated at this time to improve aphasia and dysarthria.     SLP Assessment  SLP Recommendation/Assessment: Patient needs continued Speech Lanaguage Pathology Services SLP Visit Diagnosis: Dysarthria and anarthria  (R47.1);Aphasia (R47.01)    Follow Up Recommendations  (Continued SLP services at level of care recommended by PT/OT)    Frequency and Duration min 2x/week  2 weeks      SLP Evaluation Cognition  Overall Cognitive Status: Difficult to assess(Due to comprehension difficulty) Arousal/Alertness: Awake/alert Orientation Level: Oriented to place;Oriented to person;Oriented to situation(Oriented to day and year not month and date) Attention: Focused;Sustained Focused Attention: Appears intact Sustained Attention: Appears intact       Comprehension  Auditory Comprehension Overall Auditory Comprehension: Impaired Yes/No Questions: Impaired Basic Biographical Questions: (5/5) Complex Questions: (5/5) Paragraph Comprehension (via yes/no questions): (3/4) Commands: Impaired One Step Basic Commands: (4/4) Two Step Basic Commands: (4/4) Multistep Basic Commands: (0/4) Visual Recognition/Discrimination Discrimination: Within Function Limits Reading Comprehension Reading Status: Within funtional limits    Expression Expression Primary Mode of Expression: Verbal Verbal Expression Overall Verbal Expression: Impaired Initiation: Impaired Automatic Speech: Counting;Day of week;Month of year(Counting: 10/10; Days: 6/7; Months: 11/12) Level of Generative/Spontaneous Verbalization: Sentence Repetition: Impaired Level of Impairment: Sentence level(3/5) Naming: Impairment Responsive: (4/5) Confrontation: Within functional limits(10/10) Convergent: (Sentence completion: 5/5)   Oral / Motor  Oral Motor/Sensory Function Overall Oral Motor/Sensory Function: Mild impairment Facial ROM: Within Functional Limits Facial Symmetry: Within Functional Limits Facial Strength: Within Functional Limits Facial Sensation: Within Functional Limits Lingual ROM: Within Functional Limits Lingual Symmetry: Within Functional Limits Lingual Strength: Reduced;Suspected CN XII (hypoglossal) dysfunction Motor  Speech Overall Motor Speech: Impaired Respiration: Within functional limits Phonation: Hoarse Resonance: Within functional limits Articulation: Impaired Level of Impairment: Sentence Intelligibility: Intelligibility reduced Word: 75-100% accurate Phrase: 50-74% accurate Sentence: 50-74% accurate Conversation: 50-74% accurate Motor Planning: Witnin functional limits Motor Speech Errors: Not applicable   Demitrios Molyneux I. Hardin Negus, [place], [place] Office number [phone] Pager [phone]                   Horton Marshall 05/01/2019, 9:44 AM

## 2019-05-01 NOTE — Progress Notes (Addendum)
Tele called to report HR sustaining in 140s. Dr. Marylyn Ishihara paged and now at bedside. Dr. Marylyn Ishihara gave verbal orders to this nurse to give 249ml bolus of NS and 5mg  of metoprolol IV. Nurse will continue to monitor. [name]

## 2019-05-01 NOTE — Discharge Summary (Signed)
.  Physician Discharge Summary  [name] IPJ:[id number] DOB: 21-Nov-1929 DOA: 04/30/2019  PCP: Lorene Dy, MD  Admit date: 04/30/2019 Discharge date: 05/01/2019  Admitted From: HomeDisposition:  Home with hospice/family Recommendations for Outpatient Follow-up:  1. Follow up with PCP in 1-2 weeks 2. Please obtain BMP/CBC in one week 3. Please follow up on the following pending results:  Home Health: YES/Hospice (YES/NO) Equipment/Devices: Hospital bed  Discharge Condition: Guarded/hospice  CODE STATUS: DNR Diet recommendation: Regular    Brief/Interim Summary: 85 yo WF presenting with altered mental status. Hx of S4 uterine CA and a fib. MR brain shows bilateral acute infarctions suggests embolic disease from the heart or ascending aorta. Family has arranged for outpt hospice w/ [facility]. Pt will discharge with home hospice. During stay, patient started treatment for endometritis. Will continue PO abx (ampicillin, flagyl) for 6 additional days. Patient had trouble with a fib rvr during stay. Family notified, and still wished to bring her home. She will be discharged in asymptomatic but guarded state.   Discharge Diagnoses:  Principal Problem:   Encephalopathy acute Active Problems:   Chronic atrial fibrillation   Uterine sarcoma (HCC)   Goals of care, counseling/discussion   Acute endometritis   Cerebral embolism with cerebral infarction    Discharge Instructions   Allergies as of 05/01/2019      Reactions   Adhesive [tape] Rash   Cardizem [diltiazem Hcl] Other (See Comments), Cough   Rash that caused dark spotting    Latex Rash, Other (See Comments)   Per Patient Latex=lip swelling      Medication List    STOP taking these medications   lisinopril 2.5 MG tablet Commonly known as:  ZESTRIL     TAKE these medications   acetaminophen 325 MG tablet Commonly known as:  TYLENOL Take 650 mg by mouth at bedtime.   ampicillin 500 MG capsule Commonly  known as:  PRINCIPEN Take 1 capsule (500 mg total) by mouth every 6 (six) hours for 6 days.   atorvastatin 10 MG tablet Commonly known as:  LIPITOR Take 10 mg by mouth Daily.   furosemide 20 MG tablet Commonly known as:  LASIX Take 20 mg by mouth daily as needed for edema.   gabapentin 100 MG capsule Commonly known as:  NEURONTIN Take 100 mg by mouth at bedtime.   isosorbide mononitrate 30 MG 24 hr tablet Commonly known as:  IMDUR Take 0.5 tablets (15 mg total) by mouth daily.   levothyroxine 50 MCG tablet Commonly known as:  SYNTHROID Take 50 mcg by mouth Daily.   meclizine 25 MG tablet Commonly known as:  ANTIVERT Take 25 mg by mouth 2 (two) times daily as needed for dizziness.   metoprolol succinate 25 MG 24 hr tablet Commonly known as:  Toprol XL Take 1 tablet (25 mg total) by mouth daily.   metroNIDAZOLE 500 MG tablet Commonly known as:  FLAGYL Take 1 tablet (500 mg total) by mouth every 6 (six) hours for 6 days.   omeprazole 20 MG capsule Commonly known as:  PRILOSEC Take 20 mg by mouth daily.   ondansetron 4 MG tablet Commonly known as:  ZOFRAN Take 4 mg by mouth 2 (two) times daily as needed for nausea/vomiting.   traMADol 50 MG tablet Commonly known as:  ULTRAM Take 25-50 mg by mouth every 12 (twelve) hours as needed for moderate pain.       Allergies  Allergen Reactions  . Adhesive [Tape] Rash  . Cardizem [  Diltiazem Hcl] Other (See Comments) and Cough    Rash that caused dark spotting   . Latex Rash and Other (See Comments)    Per Patient Latex=lip swelling    Consultations:  Neurology  Palliative Care   Procedures/Studies: Ct Angio Chest Pe W And/or Wo Contrast  Result Date: 04/30/2019 CLINICAL DATA:  Acute generalized abdominal pain. EXAM: CT ANGIOGRAPHY CHEST CT ABDOMEN AND PELVIS WITH CONTRAST TECHNIQUE: Multidetector CT imaging of the chest was performed using the standard protocol during bolus administration of intravenous contrast.  Multiplanar CT image reconstructions and MIPs were obtained to evaluate the vascular anatomy. Multidetector CT imaging of the abdomen and pelvis was performed using the standard protocol during bolus administration of intravenous contrast. CONTRAST:  145mL OMNIPAQUE IOHEXOL 350 MG/ML SOLN COMPARISON:  PET scan of February 11, 2019. CT scan of January 30, 2019. FINDINGS: CTA CHEST FINDINGS Cardiovascular: Satisfactory opacification of the pulmonary arteries to the segmental level. No evidence of pulmonary embolism. Mild cardiomegaly. No pericardial effusion. Mediastinum/Nodes: No enlarged mediastinal, hilar, or axillary lymph nodes. Thyroid gland, trachea, and esophagus demonstrate no significant findings. Lungs/Pleura: No pneumothorax is noted. Minimal left pleural effusion is noted with adjacent subsegmental atelectasis of the left lower lobe. New 1 cm subpleural nodule is noted in the right upper lobe concerning for possible metastatic disease. Mild lingular subsegmental atelectasis or infiltrate is noted. Musculoskeletal: No chest wall abnormality. No acute or significant osseous findings. Review of the MIP images confirms the above findings. CT ABDOMEN and PELVIS FINDINGS Hepatobiliary: Status post cholecystectomy. Stable intrahepatic and extrahepatic biliary dilatation is noted. Probable stones are noted in the distal common bile duct consistent with choledocholithiasis. Stable hepatic cysts are noted compared to prior exam. Pancreas: Unremarkable. No pancreatic ductal dilatation or surrounding inflammatory changes. Spleen: Normal in size without focal abnormality. Adrenals/Urinary Tract: Adrenal glands appear normal. Nonobstructive left renal calculus is noted. No hydronephrosis or renal obstruction is noted. Stable right renal cysts are noted. Urinary bladder is unremarkable. Stomach/Bowel: Stomach appears normal. There is no evidence of bowel obstruction or inflammation. The appendix is not visualized.  Vascular/Lymphatic: No significant vascular abnormality is noted. Stable celiac lymph node is noted measuring 11 mm. Interval development of 16 mm lymph node in pre aortic space concerning for metastatic disease. Reproductive: There is interval development of large amount of fluid within the uterus that measures 20 x 8 cm. There is some high density material within this suggesting possible hematoma with small amount of gas present suggesting possible infection or abscess. Probable hydrosalpinx is seen on the right. No left adnexal abnormality is noted. Other: No abdominal wall hernia or abnormality. No abdominopelvic ascites. Musculoskeletal: No acute or significant osseous findings. Review of the MIP images confirms the above findings. IMPRESSION: No definite evidence of pulmonary embolus. Interval development of foot appears to be large amount of complex fluid and gas within the uterus which measures 20 x 8 cm. Some high density material is noted suggesting hemorrhage, with some gas present is well suggesting possible infection or abscess. Clinical correlation is recommended. New 1 cm right upper lobe pulmonary nodule is noted concerning for metastatic disease. New 16 mm preaortic lymph node is noted in the retroperitoneal space concerning for metastatic disease. Minimal left pleural effusion is noted with adjacent subsegmental atelectasis of the left lower lobe. Stable intrahepatic and extrahepatic biliary dilatation is noted secondary to choledocholithiasis. Nonobstructive left renal calculus. No hydronephrosis or renal obstruction is noted. Electronically Signed   By: Bobbe Medico.D.  On: 04/30/2019 13:18   Mr Brain Wo Contrast  Result Date: 04/30/2019 CLINICAL DATA:  History of colon cancer and uterine cancer. Atrial fibrillation. Altered mental status. Expressive aphasia and generalized weakness. EXAM: MRI HEAD WITHOUT CONTRAST TECHNIQUE: Multiplanar, multiecho pulse sequences of the brain and  surrounding structures were obtained without intravenous contrast. COMPARISON:  Head CT same day FINDINGS: Brain: Diffusion imaging shows a punctate acute infarction in the subcortical white matter of the right posterior frontal region. There is acute infarction in the left posterior frontal region affecting a gyrus and the underlying white matter. This is probably the precentral gyrus. No evidence of swelling or hemorrhage. Elsewhere, brainstem shows chronic small-vessel change of the pons. There is a 1.2 Cm meningioma along the left edge of the foramen magnum without mass-effect upon the brainstem. Cerebral hemispheres show atrophy with mild chronic small-vessel ischemic change of the white matter. No large vessel territory infarction. No intra-axial mass lesion, hemorrhage, hydrocephalus or extra-axial collection. Vascular: Major vessels at the base of the brain show flow. Skull and upper cervical spine: Negative except for a right calvarial osteoma as shown by CT. Sinuses/Orbits: Clear/normal Other: None IMPRESSION: Acute cortical infarction in the left posterior frontal region, probably the precentral gyrus. No swelling or hemorrhage. Punctate acute infarction in the subcortical white matter of the right posterior frontal lobe. Bilateral acute infarctions suggests embolic disease from the heart or ascending aorta. Chronic small-vessel ischemic changes of the pons and cerebral hemispheric white matter. 12 mm meningioma along the left edge of the foramen magnum, without neural compression. Electronically Signed   By: Nelson Chimes M.D.   On: 04/30/2019 18:23   Ct Abdomen Pelvis W Contrast  Result Date: 04/30/2019 CLINICAL DATA:  Acute generalized abdominal pain. EXAM: CT ANGIOGRAPHY CHEST CT ABDOMEN AND PELVIS WITH CONTRAST TECHNIQUE: Multidetector CT imaging of the chest was performed using the standard protocol during bolus administration of intravenous contrast. Multiplanar CT image reconstructions and MIPs  were obtained to evaluate the vascular anatomy. Multidetector CT imaging of the abdomen and pelvis was performed using the standard protocol during bolus administration of intravenous contrast. CONTRAST:  14mL OMNIPAQUE IOHEXOL 350 MG/ML SOLN COMPARISON:  PET scan of February 11, 2019. CT scan of January 30, 2019. FINDINGS: CTA CHEST FINDINGS Cardiovascular: Satisfactory opacification of the pulmonary arteries to the segmental level. No evidence of pulmonary embolism. Mild cardiomegaly. No pericardial effusion. Mediastinum/Nodes: No enlarged mediastinal, hilar, or axillary lymph nodes. Thyroid gland, trachea, and esophagus demonstrate no significant findings. Lungs/Pleura: No pneumothorax is noted. Minimal left pleural effusion is noted with adjacent subsegmental atelectasis of the left lower lobe. New 1 cm subpleural nodule is noted in the right upper lobe concerning for possible metastatic disease. Mild lingular subsegmental atelectasis or infiltrate is noted. Musculoskeletal: No chest wall abnormality. No acute or significant osseous findings. Review of the MIP images confirms the above findings. CT ABDOMEN and PELVIS FINDINGS Hepatobiliary: Status post cholecystectomy. Stable intrahepatic and extrahepatic biliary dilatation is noted. Probable stones are noted in the distal common bile duct consistent with choledocholithiasis. Stable hepatic cysts are noted compared to prior exam. Pancreas: Unremarkable. No pancreatic ductal dilatation or surrounding inflammatory changes. Spleen: Normal in size without focal abnormality. Adrenals/Urinary Tract: Adrenal glands appear normal. Nonobstructive left renal calculus is noted. No hydronephrosis or renal obstruction is noted. Stable right renal cysts are noted. Urinary bladder is unremarkable. Stomach/Bowel: Stomach appears normal. There is no evidence of bowel obstruction or inflammation. The appendix is not visualized. Vascular/Lymphatic: No significant vascular  abnormality is noted. Stable celiac lymph node is noted measuring 11 mm. Interval development of 16 mm lymph node in pre aortic space concerning for metastatic disease. Reproductive: There is interval development of large amount of fluid within the uterus that measures 20 x 8 cm. There is some high density material within this suggesting possible hematoma with small amount of gas present suggesting possible infection or abscess. Probable hydrosalpinx is seen on the right. No left adnexal abnormality is noted. Other: No abdominal wall hernia or abnormality. No abdominopelvic ascites. Musculoskeletal: No acute or significant osseous findings. Review of the MIP images confirms the above findings. IMPRESSION: No definite evidence of pulmonary embolus. Interval development of foot appears to be large amount of complex fluid and gas within the uterus which measures 20 x 8 cm. Some high density material is noted suggesting hemorrhage, with some gas present is well suggesting possible infection or abscess. Clinical correlation is recommended. New 1 cm right upper lobe pulmonary nodule is noted concerning for metastatic disease. New 16 mm preaortic lymph node is noted in the retroperitoneal space concerning for metastatic disease. Minimal left pleural effusion is noted with adjacent subsegmental atelectasis of the left lower lobe. Stable intrahepatic and extrahepatic biliary dilatation is noted secondary to choledocholithiasis. Nonobstructive left renal calculus. No hydronephrosis or renal obstruction is noted. Electronically Signed   By: Marijo Conception M.D.   On: 04/30/2019 13:18   Ct Head Code Stroke Wo Contrast  Result Date: 04/30/2019 CLINICAL DATA:  Code stroke. Difficulty breathing, last seen normal earlier this morning, altered mental status. No obvious weakness of arms or legs. Expressive aphasia. Atrial fibrillation. Uterine sarcoma. EXAM: CT HEAD WITHOUT CONTRAST TECHNIQUE: Contiguous axial images were  obtained from the base of the skull through the vertex without intravenous contrast. COMPARISON:  None. FINDINGS: Significant motion degradation. Study is of reduced diagnostic sensitivity and specificity. Brain: No evidence for acute infarction, hemorrhage, mass lesion, hydrocephalus, or extra-axial fluid. Generalized atrophy. Hypoattenuation of white matter, likely small vessel disease. Vascular: No definite hypertensive vessel. Skull: No definite destructive lesion. Large osteoma, RIGHT occipital bone, projecting exterior. Sinuses/Orbits: No gross sinus fluid.  Negative orbits. Other: None. ASPECTS [facility] Stroke Program Early CT Score) - Ganglionic level infarction (caudate, lentiform nuclei, internal capsule, insula, M1-M3 cortex): 7 - Supraganglionic infarction (M4-M6 cortex): 3 Total score (0-10 with 10 being normal): 10 IMPRESSION: 1. Suboptimal study demonstrating no definite acute stroke or large vessel occlusion. Diagnostic accuracy is limited however given the motion degradation. 2. ASPECTS is 10. 3. These results were communicated to Dr. Lorraine Lax at 12:46 pmon 4/30/2020by text page via the [facility] messaging system. Electronically Signed   By: Staci Righter M.D.   On: 04/30/2019 12:46    (Echo, Carotid, EGD, Colonoscopy, ERCP)    Subjective:   Discharge Exam: Vitals:   05/01/19 1531 05/01/19 1556  BP: 103/64 97/83  Pulse:  (!) 142  Resp:    Temp:  97.6 F (36.4 C)  SpO2:  97%   Vitals:   05/01/19 1143 05/01/19 1332 05/01/19 1531 05/01/19 1556  BP: 98/65 98/79 103/64 97/83  Pulse: (!) 128   (!) 142  Resp: 16     Temp: 97.9 F (36.6 C)   97.6 F (36.4 C)  TempSrc: Oral   Oral  SpO2: 95%   97%  Weight:      Height:        General: Pt is alert, awake, not in acute distress Cardiovascular: irregular, tachy, 1/6 SEM Respiratory: CTA  bilaterally, no wheezing, no rhonchi Abdominal: Soft, NT, ND, bowel sounds + Extremities: no edema, no cyanosis    The results of significant  diagnostics from this hospitalization (including imaging, microbiology, ancillary and laboratory) are listed below for reference.     Microbiology: No results found for this or any previous visit (from the past 240 hour(s)).   Labs: BNP (last 3 results) No results for input(s): BNP in the last 8760 hours. Basic Metabolic Panel: Recent Labs  Lab 04/30/19 1218 04/30/19 1223 04/30/19 1416  NA 136  --  134*  K 4.0  --  4.0  CL 99  --   --   CO2 24  --   --   GLUCOSE 91  --   --   BUN 17  --   --   CREATININE 0.88 0.50  --   CALCIUM 9.4  --   --    Liver Function Tests: Recent Labs  Lab 04/30/19 1218  AST 15  ALT 12  ALKPHOS 88  BILITOT 1.6*  PROT 5.7*  ALBUMIN 2.3*   No results for input(s): LIPASE, AMYLASE in the last 168 hours. Recent Labs  Lab 04/30/19 1402  AMMONIA 17   CBC: Recent Labs  Lab 04/30/19 1218 04/30/19 1416  WBC 16.1*  --   NEUTROABS 13.8*  --   HGB 11.6* 11.2*  HCT 38.6 33.0*  MCV 103.5*  --   PLT 261  --    Cardiac Enzymes: No results for input(s): CKTOTAL, CKMB, CKMBINDEX, TROPONINI in the last 168 hours. BNP: Invalid input(s): POCBNP CBG: No results for input(s): GLUCAP in the last 168 hours. D-Dimer No results for input(s): DDIMER in the last 72 hours. Hgb A1c Recent Labs    05/01/19 0311  HGBA1C 4.9   Lipid Profile Recent Labs    05/01/19 0311  CHOL 78  HDL 27*  LDLCALC 36  TRIG 77  CHOLHDL 2.9   Thyroid function studies Recent Labs    04/30/19 1402  TSH 0.914   Anemia work up No results for input(s): VITAMINB12, FOLATE, FERRITIN, TIBC, IRON, RETICCTPCT in the last 72 hours. Urinalysis    Component Value Date/Time   COLORURINE AMBER (A) 04/30/2019 0107   APPEARANCEUR CLOUDY (A) 04/30/2019 0107   LABSPEC >1.046 (H) 04/30/2019 0107   PHURINE 6.0 04/30/2019 0107   GLUCOSEU NEGATIVE 04/30/2019 0107   HGBUR LARGE (A) 04/30/2019 0107   BILIRUBINUR NEGATIVE 04/30/2019 0107   KETONESUR 5 (A) 04/30/2019 0107    PROTEINUR >=300 (A) 04/30/2019 0107   UROBILINOGEN 0.2 08/28/2015 1712   NITRITE NEGATIVE 04/30/2019 0107   LEUKOCYTESUR MODERATE (A) 04/30/2019 0107   Sepsis Labs Invalid input(s): PROCALCITONIN,  WBC,  LACTICIDVEN Microbiology No results found for this or any previous visit (from the past 240 hour(s)).   Time coordinating discharge: Over 45 minutes  SIGNED:   Jonnie Finner, DO  [facility] 05/01/2019, 4:41 PM Pager [phone]  If 7PM-7AM, please contact night-coverage [email or web address] Password [email or web address]

## 2019-05-01 NOTE — Progress Notes (Signed)
  Speech Language Pathology Treatment: Cognitive-Linquistic(Dysarthria )  Patient Details Name: [name] MRN: [id number] DOB: 1929-06-21 Today's Date: 05/01/2019 Time: 0177-9390 SLP Time Calculation (min) (ACUTE ONLY): 12 min  Assessment / Plan / Recommendation Clinical Impression  Pt was educated regarding compensatory strategies for speech intelligibility and she verbalized understanding. She independently demonstrated 100% accuracy with use of compensatory strategies at the single-word and phrase levels. She achieved 80% accuracy at the 5-7 word sentence level increasing to 100% accuracy with minimal cues. The session was abbreviated since it was determined that the pt's family has decided on the pt being discharged home with hospice care.    HPI HPI: Pt is an 85 y.o. female with medical history significant of stage IV uterine cancer and atrial fibrillation. Pt's daughter reported that the pt had not been herself for the past couple of days and she woke up on 04/30/19 unable to speak. Multiple conversations have been had with the patient and her daughter regarding palliative care due uterine cancer and the possibility of moving onto hospice but since at the time she had not had known metastases patient and her daughter were hopeful to get some more time. MRI of the brain revealed acute cortical infarction in the left posterior frontal region. Punctate acute infarction in the subcortical white matter of the right posterior frontal lobe.      SLP Plan  Other (Comment)(Pt's family has decided on hospice care. )       Recommendations                   Follow up Recommendations: Other (comment)(Pt's family has decided on hospice care) SLP Visit Diagnosis: Dysarthria and anarthria (R47.1);Aphasia (R47.01) Plan: Other (Comment)(Pt's family has decided on hospice care. )       [name], [place], [place] Office number [phone] Pager  [phone]                 [name] 05/01/2019, 5:40 PM

## 2019-05-01 NOTE — Consult Note (Addendum)
Consultation Note Date: 05/01/2019   Patient Name: [name]  DOB: 1929-10-04  MRN: [id number]  Age / Sex: 85 y.o., female   PCP: Sheila Dy, MD Referring Physician: Jonnie Finner, DO   REASON FOR CONSULTATION:Establishing goals of care  Palliative Care consult requested for this 85 y.o. female with multiple medical problems including stage IV uterine cancer s/p radiation ([place]), atrial fibrillation (anticoagulant discontinued due to uterine bleeding), hemochromatosis, hypertension, colon cancer (1996), and thyroid disease. She was admitted after presenting to ED after daughter noticed her not being herself for several days and waking up unable to speak. CT scan of chest, abdomen, and pelvis showed fluid in uterus consistent with infection, metastatic disease to right upper lobe, preaortic lymph nodes, and left pleural effusion. She has been started on ampicillin and clindamycin.   Clinical Assessment and Goals of Care: I have reviewed medical records including lab results, imaging, Epic notes, and MAR, received report from the bedside RN, and assessed the patient. I met at the bedside with patient and spoke with her daughter, [name]) to discuss diagnosis prognosis, GOC, EOL wishes, disposition and options. Patient is out of bed in chair. She is awake, alert, and oriented. She does have some expressive aphasia but able to appropriately engage in conversation.   I introduced Palliative Medicine as specialized medical care for people living with serious illness. It focuses on providing relief from the symptoms and stress of a serious illness. The goal is to improve quality of life for both the patient and the family.   Daughter [name] expressed she has already been in contact with [facility] and [place] with [facility].   We discussed a brief life review of the patient, along with her functional and nutritional status. Patient was able to verbalize she lives with  her husband of 26 years. [place] farm and she has 4 children. Her 2 daughters [name] and [name] both live next door to patient and is her main support. She reports her husband is 51 years old.   Prior to admission [name] was able to perform most ADLs independently with some set-up assistance by her daughters. Her daughter reports she and her sister runs all the errands and does all of the household chores and meal preparations. She also assist patient with medication administration. Patient was ambulatory with walker prior to admission and enjoyed spending time with family and sitting on sun porch.   We discussed Her current illness and what it means in the larger context of Her on-going co-morbidities. With specific discussions regarding patients recent embolic stroke, metastatic cancer, uncontrolled atrial fibrillation, and hypotension. Natural disease trajectory and expectations at EOL were discussed. Patient expressed her awareness of her condition and expressed "I just want to be at home".   Daughter, [name]) verbalized her understanding of her mother's condition and terminal cancer. She understand her HR continues to be irregular due to atrial fibrillation and she is hypotensive. She expresses their awareness and does not want escalation of treatment. She is tearful in expressing she does not want her mother to suffer and is hopeful she can get home with family. Support given.   I attempted to elicit values and goals of care important to the patient.    The difference between aggressive medical intervention and comfort care was considered in light of the patient's goals of care. At this time daughter wishes to continue with antibiotics and current treatment without escalation.  She expresses they are not naive to her condition and knows that she does not have much time left. She expressed their wishes are for her to hopefully get home with hospice support and be kept comfortable  amongst family.   I spent a detail amount of time discussing her current condition. Daughter expressed they were not interested in escalation of care such as drips, or other heroic measures. She is aware and had previous discussions prior to this admission regarding the complexity of managing her mother's atrial fibrillation and hypotension due to her uterine cancer and bleeding. She reports she knows from previous conversations that many of the medications could also increase hypotension and that the point of not administering certain medications may come. She expressed they are ok with this and again express their goal is to hopefully be able to get her home. Support given.   She has already been in discussion with outpatient hospice team. I again educated her on the goals and care of community hospice. I also discussed with patient, who is accepting of hospice care. Patient and daughter verbalized their understanding and awareness of hospice's goals and philosophy of care.  [name] expressed equipment is scheduled to be delivered today between 5-9pm this afternoon (hospital bed, oxygen, and shower chair). She expressed once equipment is delivered she is hopeful that her mother can get home (tomorrow). Support given. I discussed with her while every effort will be made to allow her mother the opportunity to get home amongst family and friends, given her current condition there is also a likelihood she could decline and pass away prior to tomorrow. [name] verbalized understanding and knows if her mother's condition appeared to be at this point the medical team would provide further updates.    Patient and daughter both confirmed DNR/DNI status. [name] was able to verbalize to me that her daughter [name] is her designated POA.    Questions and concerns were addressed. The family was encouraged to call with questions or concerns.  PMT will continue to support holistically.   SOCIAL HISTORY:      reports that she has never smoked. She has never used smokeless tobacco. She reports that she does not drink alcohol or use drugs.  CODE STATUS: DNR  ADVANCE DIRECTIVES: Primary Decision Maker: Sheila Lewis (POA)     SYMPTOM MANAGEMENT: Per attending   Palliative Prophylaxis:   Aspiration, Delirium Protocol, Frequent Pain Assessment, Oral Care and Turn Reposition  PSYCHO-SOCIAL/SPIRITUAL:  Support System: family   Desire for further Chaplaincy support:NO   Additional Recommendations (Limitations, Scope, Preferences):  Avoid Hospitalization and No Artificial Feeding, continue to treat the treatable without escalation of care.    PAST MEDICAL HISTORY: Past Medical History:  Diagnosis Date   Arthritis    Arthritis pain    Atrial fibrillation ([place])    Cancer ([place])    colon, 1996   Constipation    Environmental allergies    Hemochromatosis 1998   History of colon cancer    HTN (hypertension)    Hx of echocardiogram    Echo (8/15):  EF 45-50%, diff HK, mild AI, MAC, mild to mod MR, severe LAE, mod RAE, PASP 32 mmHg   PONV (postoperative nausea and vomiting)    Thyroid disease    Varicose vein of leg     PAST SURGICAL HISTORY:  Past Surgical History:  Procedure Laterality Date   BREAST EXCISIONAL BIOPSY Right 1991   benign   [place]  br lump removed-benign   COLON SURGERY  1996   CANCER REMOVAL   GALLBLADDER SURGERY  2005   LIVER BIOPSY  04/08/2012   Procedure: LIVER BIOPSY;  Surgeon: Adin Hector, MD;  Location: [facility];  Service: General;;  laparoscopic   TOTAL HIP ARTHROPLASTY  2004   RIGHT    ALLERGIES:  is allergic to adhesive [tape]; cardizem [diltiazem hcl]; and latex.   MEDICATIONS:  Current Facility-Administered Medications  Medication Dose Route Frequency Provider Last Rate Last Dose    stroke: mapping our early stages of recovery book   Does not apply Once Lady Deutscher, MD       0.9 %  sodium  chloride infusion   Intravenous Continuous Lady Deutscher, MD 75 mL/hr at 05/01/19 5726     acetaminophen (TYLENOL) tablet 650 mg  650 mg Oral Q4H PRN Lady Deutscher, MD       Or   acetaminophen (TYLENOL) solution 650 mg  650 mg Per Tube Q4H PRN Lady Deutscher, MD   650 mg at 05/01/19 0630   Or   acetaminophen (TYLENOL) suppository 650 mg  650 mg Rectal Q4H PRN Lady Deutscher, MD       ampicillin (PRINCIPEN) capsule 500 mg  500 mg Oral Q6H Lady Deutscher, MD   500 mg at 05/01/19 1208   atorvastatin (LIPITOR) tablet 10 mg  10 mg Oral q1800 Lady Deutscher, MD       gabapentin (NEURONTIN) capsule 100 mg  100 mg Oral QHS Lady Deutscher, MD   100 mg at 04/30/19 2219   levothyroxine (SYNTHROID) tablet 50 mcg  50 mcg Oral Q0600 Lady Deutscher, MD   50 mcg at 05/01/19 2035   metoprolol succinate (TOPROL-XL) 24 hr tablet 25 mg  25 mg Oral Daily Kyle, Tyrone A, DO   25 mg at 05/01/19 1058   metroNIDAZOLE (FLAGYL) tablet 500 mg  500 mg Oral Q6H Lady Deutscher, MD   500 mg at 05/01/19 1208   pantoprazole (PROTONIX) EC tablet 40 mg  40 mg Oral Daily Lady Deutscher, MD   40 mg at 05/01/19 1058   senna-docusate (Senokot-S) tablet 1 tablet  1 tablet Oral QHS PRN Lady Deutscher, MD       traMADol Veatrice Bourbon) tablet 25-50 mg  25-50 mg Oral Q12H PRN Lady Deutscher, MD   50 mg at 04/30/19 2219    VITAL SIGNS: BP 98/65 (BP Location: Right Arm)    Pulse (!) 128    Temp 97.9 F (36.6 C) (Oral)    Resp 16    Ht _0  (1.6 m)    Wt 74.3 kg    SpO2 95%    BMI 29.02 kg/m  Filed Weights   04/30/19 1219 04/30/19 1259  Weight: 74.3 kg 74.3 kg    Estimated body mass index is 29.02 kg/m as calculated from the following:   Height as of this encounter: _1  (1.6 m).   Weight as of this encounter: 74.3 kg.  LABS: CBC:    Component Value Date/Time   WBC 16.1 (H) 04/30/2019 1218   HGB 11.2 (L) 04/30/2019 1416   HCT 33.0 (L) 04/30/2019 1416   PLT 261  04/30/2019 1218   Comprehensive Metabolic Panel:    Component Value Date/Time   NA 134 (L) 04/30/2019 1416   K 4.0 04/30/2019 1416   CO2 24 04/30/2019 1218   BUN 17 04/30/2019 1218   CREATININE 0.50 04/30/2019  1223   ALBUMIN 2.3 (L) 04/30/2019 1218     Review of Systems  Constitutional: Positive for fatigue.  Neurological: Positive for weakness.  All other systems reviewed and are negative.    Physical Exam Vitals signs and nursing note reviewed.  Constitutional:      General: She is awake.     Appearance: She is normal weight. She is ill-appearing.     Comments: frail  Cardiovascular:     Rate and Rhythm: Tachycardia present. Rhythm irregular.     Pulses: Normal pulses.     Heart sounds: Normal heart sounds.  Pulmonary:     Effort: Pulmonary effort is normal.     Breath sounds: Decreased breath sounds present.  Abdominal:     General: Bowel sounds are normal.     Palpations: Abdomen is soft.  Musculoskeletal:     Comments: Generalized weakness, able to lift all limbs   Skin:    General: Skin is warm and dry.  Neurological:     Mental Status: She is alert and oriented to person, place, and time.     Motor: Weakness present.  Psychiatric:        Behavior: Behavior is cooperative.     Comments: Expressive aphasia    Prognosis: days to weeks in the setting of metastatic uterine cancer s/p radiation therapy (RUL nodules and retroperitoneal lymph node), bilateral frontal parietal infarcts embolic, atrial fibrillation unable to anticoagulate d/t uterine bleeding, hypertension, hyperlipidemia, generalized weakness, expressive aphasia, and decreased po intake.   Discharge Planning:  Home with Hospice as requested by patient and [name] (POA).   Recommendations:  DNR/DNI  Continue to treat the treatable without escalation of care/aggressive interventions with goal of hopefully returning home once medical equipment is delivered (expected to deliver by 8pm tonight) per  daughter/poa.  Patient and daughter's goal is home with family for comfort and EOL support with [facility].   Benjamine Mola, CSW already in contact and finalizing arrangements with family and hospice   PMT will continue to support and follow as needed   Palliative Performance Scale: PPS 20%                Patient and her daughter, [name]) expressed understanding and was in agreement with this plan.   The above conversation was completed via telephone due to the visitor restrictions during the COVID-19 pandemic. Thorough chart review and discussion with necessary members of the care team was completed as part of assessment.   Thank you for allowing the Palliative Medicine Team to assist in the care of this patient.  Time In: 1400 Time Out: 1515 Time Total: 75 min  Visit consisted of counseling and education dealing with the complex and emotionally intense issues of symptom management and palliative care in the setting of serious and potentially life-threatening illness.Greater than 50%  of this time was spent counseling and coordinating care related to the above assessment and plan.  Signed by:  Alda Lea, AGPCNP-BC Palliative Medicine Team  Phone: [phone] Fax: [phone] Pager: [phone] Amion: Bjorn Pippin

## 2019-05-01 NOTE — Evaluation (Signed)
Occupational Therapy Evaluation and Discharge Patient Details Name: [name] MRN: [id number] DOB: 08-28-1929 Today's Date: 05/01/2019    History of Present Illness Pt is an 85 y/o female who presents with AMS and decreased verbal response. 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 12 mm meningioma along the left edge of the foramen magnum, without neural compression. PMH significant for thyroid disease, HTN, colon CA, Uterine CA, R THR.   Clinical Impression   This 85 yo female admitted with above presents to acute OT with decreased endurance for activity and decreased balance both affecting her safety and independence with basic ADLs. The decision has now been made that pt will go home with hospice care so no OT follow up is recommended, we will sign off.    Follow Up Recommendations  No OT follow up;Supervision/Assistance - 24 hour    Equipment Recommendations  3 in 1 bedside commode       Precautions / Restrictions Precautions Precautions: Fall Precaution Comments: Expressive aphasia Restrictions Weight Bearing Restrictions: No      Mobility Bed Mobility Overal bed mobility: Needs Assistance Bed Mobility: Supine to Sit     Supine to sit: Min assist;Mod assist     General bed mobility comments: Therapist assisting with bed pad for scooting and trunk elevation to full sitting position.   Transfers Overall transfer level: Needs assistance Equipment used: Rolling walker (2 wheeled) Transfers: Sit to/from Stand Sit to Stand: Min assist;+2 safety/equipment         General transfer comment: VC's for hand placement on seated surface for safety. Pt moving slowly and was able to power-up to full stand with assist at gait belt. +2 for safety as pt performed SPT to/from [facility].     Balance Overall balance assessment: Needs assistance Sitting-balance support: Feet supported;No upper  extremity supported Sitting balance-Leahy Scale: Fair     Standing balance support: Bilateral upper extremity supported;During functional activity Standing balance-Leahy Scale: Poor Standing balance comment: Pt required UE support for all aspects of standing activity.                            ADL either performed or assessed with clinical judgement   ADL Overall ADL's : Needs assistance/impaired Eating/Feeding: Set up;Supervision/ safety;Bed level Eating/Feeding Details (indicate cue type and reason): supported sitting Grooming: Supervision/safety;Set up Grooming Details (indicate cue type and reason): supported sitting Upper Body Bathing: Set up;Supervision/ safety Upper Body Bathing Details (indicate cue type and reason): supported sitting Lower Body Bathing: Maximal assistance Lower Body Bathing Details (indicate cue type and reason): min A +2 sit<>stand Upper Body Dressing : Minimal assistance Upper Body Dressing Details (indicate cue type and reason): supported sitting Lower Body Dressing: Maximal assistance Lower Body Dressing Details (indicate cue type and reason): min A +2 sit<>stand Toilet Transfer: Minimal assistance;+2 for physical assistance;Stand-pivot;BSC;RW   Toileting- Clothing Manipulation and Hygiene: Total assistance Toileting - Clothing Manipulation Details (indicate cue type and reason): min A +2 sit<>stand             Vision Patient Visual Report: No change from baseline              Pertinent Vitals/Pain Pain Assessment: No/denies pain     Hand Dominance Right   Extremity/Trunk Assessment Upper Extremity Assessment Upper Extremity Assessment: Generalized weakness   Lower Extremity Assessment Lower Extremity Assessment: Generalized weakness  Cervical / Trunk Assessment Cervical / Trunk Assessment: Kyphotic   Communication Communication Communication: Expressive difficulties   Cognition Arousal/Alertness:  Awake/alert Behavior During Therapy: Flat affect Overall Cognitive Status: Impaired/Different from baseline Area of Impairment: Following commands;Safety/judgement;Awareness;Problem solving                       Following Commands: Follows one step commands consistently;Follows one step commands with increased time Safety/Judgement: Decreased awareness of deficits Awareness: Intellectual Problem Solving: Slow processing;Requires verbal cues                Home Living Family/patient expects to be discharged to:: Private residence Living Arrangements: Spouse/significant other(Husband has Alzheimer's per chart review) Available Help at Discharge: Family;Available 24 hours/day Type of Home: House Home Access: Stairs to enter [employer] of Steps: 3   Home Layout: One level               Home Equipment: Walker - 2 wheels;Shower seat;Cane - single point      Lives With: Spouse    Prior Functioning/Environment          Comments: Pt reports she uses a SPC for ambulation and daughters check in on pt and husband.         OT Problem List: Decreased strength;Impaired balance (sitting and/or standing)         OT Goals(Current goals can be found in the care plan section) Acute Rehab OT Goals Patient Stated Goal: Pt did not state goals.  OT Frequency:             Co-evaluation PT/OT/SLP Co-Evaluation/Treatment: Yes Reason for Co-Treatment: For patient/therapist safety   OT goals addressed during session: ADL's and self-care;Strengthening/ROM      AM-PAC OT "6 Clicks" Daily Activity     Outcome Measure Help from another person eating meals?: A Little Help from another person taking care of personal grooming?: A Little Help from another person toileting, which includes using toliet, bedpan, or urinal?: A Lot Help from another person bathing (including washing, rinsing, drying)?: A Lot Help from another person to put on and taking off regular  upper body clothing?: A Lot Help from another person to put on and taking off regular lower body clothing?: Total 6 Click Score: 13   End of Session Equipment Utilized During Treatment: Gait belt;Rolling walker Nurse Communication: Mobility status  Activity Tolerance: Patient limited by fatigue Patient left: in chair;with call bell/phone within reach;with chair alarm set  OT Visit Diagnosis: Unsteadiness on feet (R26.81);Other abnormalities of gait and mobility (R26.89);Muscle weakness (generalized) (M62.81)                Time: 9532-0233 OT Time Calculation (min): 25 min Charges:  OT General Charges $OT Visit: 1 Visit OT Evaluation $OT Eval Moderate Complexity: Walker, OTR/L Acute [employer] Pager [phone] Office [phone]     Almon Register 05/01/2019, 1:46 PM

## 2019-05-01 NOTE — Progress Notes (Signed)
STROKE TEAM PROGRESS NOTE   INTERVAL HISTORY I have reviewed patient's history of presenting illness and electronic medical records as well as his imaging films.  She states her speech and language difficulties and slurred speech appear to be improving though they are not back to baseline.  Vitals:   05/01/19 0035 05/01/19 0235 05/01/19 0412 05/01/19 0734  BP: (!) 111/55 (!) 94/56 104/74 116/81  Pulse: (!) 38 (!) 151 (!) 136 (!) 121  Resp: 15 18 18 18   Temp: 97.6 F (36.4 C) 97.8 F (36.6 C) (!) 97.5 F (36.4 C) 98 F (36.7 C)  TempSrc: Oral Oral Oral Oral  SpO2: 100% 96% 97% 100%  Weight:      Height:        CBC:  Recent Labs  Lab 04/30/19 1218 04/30/19 1416  WBC 16.1*  --   NEUTROABS 13.8*  --   HGB 11.6* 11.2*  HCT 38.6 33.0*  MCV 103.5*  --   PLT 261  --     Basic Metabolic Panel:  Recent Labs  Lab 04/30/19 1218 04/30/19 1223 04/30/19 1416  NA 136  --  134*  K 4.0  --  4.0  CL 99  --   --   CO2 24  --   --   GLUCOSE 91  --   --   BUN 17  --   --   CREATININE 0.88 0.50  --   CALCIUM 9.4  --   --    Lipid Panel:     Component Value Date/Time   CHOL 78 05/01/2019 0311   TRIG 77 05/01/2019 0311   HDL 27 (L) 05/01/2019 0311   CHOLHDL 2.9 05/01/2019 0311   VLDL 15 05/01/2019 0311   LDLCALC 36 05/01/2019 0311   HgbA1c:  Lab Results  Component Value Date   HGBA1C 4.9 05/01/2019   Urine Drug Screen:     Component Value Date/Time   LABOPIA NONE DETECTED 04/30/2019 0106   COCAINSCRNUR NONE DETECTED 04/30/2019 0106   LABBENZ NONE DETECTED 04/30/2019 0106   AMPHETMU NONE DETECTED 04/30/2019 0106   THCU NONE DETECTED 04/30/2019 0106   LABBARB NONE DETECTED 04/30/2019 0106    Alcohol Level     Component Value Date/Time   ETH <10 04/30/2019 1218    IMAGING Ct Angio Chest Pe W And/or Wo Contrast  Result Date: 04/30/2019 CLINICAL DATA:  Acute generalized abdominal pain. EXAM: CT ANGIOGRAPHY CHEST CT ABDOMEN AND PELVIS WITH CONTRAST TECHNIQUE:  Multidetector CT imaging of the chest was performed using the standard protocol during bolus administration of intravenous contrast. Multiplanar CT image reconstructions and MIPs were obtained to evaluate the vascular anatomy. Multidetector CT imaging of the abdomen and pelvis was performed using the standard protocol during bolus administration of intravenous contrast. CONTRAST:  126mL OMNIPAQUE IOHEXOL 350 MG/ML SOLN COMPARISON:  PET scan of February 11, 2019. CT scan of January 30, 2019. FINDINGS: CTA CHEST FINDINGS Cardiovascular: Satisfactory opacification of the pulmonary arteries to the segmental level. No evidence of pulmonary embolism. Mild cardiomegaly. No pericardial effusion. Mediastinum/Nodes: No enlarged mediastinal, hilar, or axillary lymph nodes. Thyroid gland, trachea, and esophagus demonstrate no significant findings. Lungs/Pleura: No pneumothorax is noted. Minimal left pleural effusion is noted with adjacent subsegmental atelectasis of the left lower lobe. New 1 cm subpleural nodule is noted in the right upper lobe concerning for possible metastatic disease. Mild lingular subsegmental atelectasis or infiltrate is noted. Musculoskeletal: No chest wall abnormality. No acute or significant osseous findings. Review of the  MIP images confirms the above findings. CT ABDOMEN and PELVIS FINDINGS Hepatobiliary: Status post cholecystectomy. Stable intrahepatic and extrahepatic biliary dilatation is noted. Probable stones are noted in the distal common bile duct consistent with choledocholithiasis. Stable hepatic cysts are noted compared to prior exam. Pancreas: Unremarkable. No pancreatic ductal dilatation or surrounding inflammatory changes. Spleen: Normal in size without focal abnormality. Adrenals/Urinary Tract: Adrenal glands appear normal. Nonobstructive left renal calculus is noted. No hydronephrosis or renal obstruction is noted. Stable right renal cysts are noted. Urinary bladder is unremarkable.  Stomach/Bowel: Stomach appears normal. There is no evidence of bowel obstruction or inflammation. The appendix is not visualized. Vascular/Lymphatic: No significant vascular abnormality is noted. Stable celiac lymph node is noted measuring 11 mm. Interval development of 16 mm lymph node in pre aortic space concerning for metastatic disease. Reproductive: There is interval development of large amount of fluid within the uterus that measures 20 x 8 cm. There is some high density material within this suggesting possible hematoma with small amount of gas present suggesting possible infection or abscess. Probable hydrosalpinx is seen on the right. No left adnexal abnormality is noted. Other: No abdominal wall hernia or abnormality. No abdominopelvic ascites. Musculoskeletal: No acute or significant osseous findings. Review of the MIP images confirms the above findings. IMPRESSION: No definite evidence of pulmonary embolus. Interval development of foot appears to be large amount of complex fluid and gas within the uterus which measures 20 x 8 cm. Some high density material is noted suggesting hemorrhage, with some gas present is well suggesting possible infection or abscess. Clinical correlation is recommended. New 1 cm right upper lobe pulmonary nodule is noted concerning for metastatic disease. New 16 mm preaortic lymph node is noted in the retroperitoneal space concerning for metastatic disease. Minimal left pleural effusion is noted with adjacent subsegmental atelectasis of the left lower lobe. Stable intrahepatic and extrahepatic biliary dilatation is noted secondary to choledocholithiasis. Nonobstructive left renal calculus. No hydronephrosis or renal obstruction is noted. Electronically Signed   By: Marijo Conception M.D.   On: 04/30/2019 13:18   Mr Brain Wo Contrast  Result Date: 04/30/2019 CLINICAL DATA:  History of colon cancer and uterine cancer. Atrial fibrillation. Altered mental status. Expressive aphasia  and generalized weakness. EXAM: MRI HEAD WITHOUT CONTRAST TECHNIQUE: Multiplanar, multiecho pulse sequences of the brain and surrounding structures were obtained without intravenous contrast. COMPARISON:  Head CT same day FINDINGS: Brain: Diffusion imaging shows a punctate acute infarction in the subcortical white matter of the right posterior frontal region. There is acute infarction in the left posterior frontal region affecting a gyrus and the underlying white matter. This is probably the precentral gyrus. No evidence of swelling or hemorrhage. Elsewhere, brainstem shows chronic small-vessel change of the pons. There is a 1.2 Cm meningioma along the left edge of the foramen magnum without mass-effect upon the brainstem. Cerebral hemispheres show atrophy with mild chronic small-vessel ischemic change of the white matter. No large vessel territory infarction. No intra-axial mass lesion, hemorrhage, hydrocephalus or extra-axial collection. Vascular: Major vessels at the base of the brain show flow. Skull and upper cervical spine: Negative except for a right calvarial osteoma as shown by CT. Sinuses/Orbits: Clear/normal Other: None IMPRESSION: Acute cortical infarction in the left posterior frontal region, probably the precentral gyrus. No swelling or hemorrhage. Punctate acute infarction in the subcortical white matter of the right posterior frontal lobe. Bilateral acute infarctions suggests embolic disease from the heart or ascending aorta. Chronic small-vessel ischemic changes of  the pons and cerebral hemispheric white matter. 12 mm meningioma along the left edge of the foramen magnum, without neural compression. Electronically Signed   By: Nelson Chimes M.D.   On: 04/30/2019 18:23   Ct Abdomen Pelvis W Contrast  Result Date: 04/30/2019 CLINICAL DATA:  Acute generalized abdominal pain. EXAM: CT ANGIOGRAPHY CHEST CT ABDOMEN AND PELVIS WITH CONTRAST TECHNIQUE: Multidetector CT imaging of the chest was performed  using the standard protocol during bolus administration of intravenous contrast. Multiplanar CT image reconstructions and MIPs were obtained to evaluate the vascular anatomy. Multidetector CT imaging of the abdomen and pelvis was performed using the standard protocol during bolus administration of intravenous contrast. CONTRAST:  180mL OMNIPAQUE IOHEXOL 350 MG/ML SOLN COMPARISON:  PET scan of February 11, 2019. CT scan of January 30, 2019. FINDINGS: CTA CHEST FINDINGS Cardiovascular: Satisfactory opacification of the pulmonary arteries to the segmental level. No evidence of pulmonary embolism. Mild cardiomegaly. No pericardial effusion. Mediastinum/Nodes: No enlarged mediastinal, hilar, or axillary lymph nodes. Thyroid gland, trachea, and esophagus demonstrate no significant findings. Lungs/Pleura: No pneumothorax is noted. Minimal left pleural effusion is noted with adjacent subsegmental atelectasis of the left lower lobe. New 1 cm subpleural nodule is noted in the right upper lobe concerning for possible metastatic disease. Mild lingular subsegmental atelectasis or infiltrate is noted. Musculoskeletal: No chest wall abnormality. No acute or significant osseous findings. Review of the MIP images confirms the above findings. CT ABDOMEN and PELVIS FINDINGS Hepatobiliary: Status post cholecystectomy. Stable intrahepatic and extrahepatic biliary dilatation is noted. Probable stones are noted in the distal common bile duct consistent with choledocholithiasis. Stable hepatic cysts are noted compared to prior exam. Pancreas: Unremarkable. No pancreatic ductal dilatation or surrounding inflammatory changes. Spleen: Normal in size without focal abnormality. Adrenals/Urinary Tract: Adrenal glands appear normal. Nonobstructive left renal calculus is noted. No hydronephrosis or renal obstruction is noted. Stable right renal cysts are noted. Urinary bladder is unremarkable. Stomach/Bowel: Stomach appears normal. There is no  evidence of bowel obstruction or inflammation. The appendix is not visualized. Vascular/Lymphatic: No significant vascular abnormality is noted. Stable celiac lymph node is noted measuring 11 mm. Interval development of 16 mm lymph node in pre aortic space concerning for metastatic disease. Reproductive: There is interval development of large amount of fluid within the uterus that measures 20 x 8 cm. There is some high density material within this suggesting possible hematoma with small amount of gas present suggesting possible infection or abscess. Probable hydrosalpinx is seen on the right. No left adnexal abnormality is noted. Other: No abdominal wall hernia or abnormality. No abdominopelvic ascites. Musculoskeletal: No acute or significant osseous findings. Review of the MIP images confirms the above findings. IMPRESSION: No definite evidence of pulmonary embolus. Interval development of foot appears to be large amount of complex fluid and gas within the uterus which measures 20 x 8 cm. Some high density material is noted suggesting hemorrhage, with some gas present is well suggesting possible infection or abscess. Clinical correlation is recommended. New 1 cm right upper lobe pulmonary nodule is noted concerning for metastatic disease. New 16 mm preaortic lymph node is noted in the retroperitoneal space concerning for metastatic disease. Minimal left pleural effusion is noted with adjacent subsegmental atelectasis of the left lower lobe. Stable intrahepatic and extrahepatic biliary dilatation is noted secondary to choledocholithiasis. Nonobstructive left renal calculus. No hydronephrosis or renal obstruction is noted. Electronically Signed   By: Marijo Conception M.D.   On: 04/30/2019 13:18   Ct Head  Code Stroke Wo Contrast  Result Date: 04/30/2019 CLINICAL DATA:  Code stroke. Difficulty breathing, last seen normal earlier this morning, altered mental status. No obvious weakness of arms or legs. Expressive  aphasia. Atrial fibrillation. Uterine sarcoma. EXAM: CT HEAD WITHOUT CONTRAST TECHNIQUE: Contiguous axial images were obtained from the base of the skull through the vertex without intravenous contrast. COMPARISON:  None. FINDINGS: Significant motion degradation. Study is of reduced diagnostic sensitivity and specificity. Brain: No evidence for acute infarction, hemorrhage, mass lesion, hydrocephalus, or extra-axial fluid. Generalized atrophy. Hypoattenuation of white matter, likely small vessel disease. Vascular: No definite hypertensive vessel. Skull: No definite destructive lesion. Large osteoma, RIGHT occipital bone, projecting exterior. Sinuses/Orbits: No gross sinus fluid.  Negative orbits. Other: None. ASPECTS [facility] Stroke Program Early CT Score) - Ganglionic level infarction (caudate, lentiform nuclei, internal capsule, insula, M1-M3 cortex): 7 - Supraganglionic infarction (M4-M6 cortex): 3 Total score (0-10 with 10 being normal): 10 IMPRESSION: 1. Suboptimal study demonstrating no definite acute stroke or large vessel occlusion. Diagnostic accuracy is limited however given the motion degradation. 2. ASPECTS is 10. 3. These results were communicated to Dr. Lorraine Lax at 12:46 pmon 4/30/2020by text page via the [facility] messaging system. Electronically Signed   By: Staci Righter M.D.   On: 04/30/2019 12:46    PHYSICAL EXAM Frail elderly Caucasian lady currently not in distress. . Afebrile. Head is nontraumatic. Neck is supple without bruit.    Cardiac exam no murmur or gallop. Lungs are clear to auscultation. Distal pulses are well felt. Neurological Exam :  Awake alert moderate expressive aphasia with nonfluent speech with word finding difficulties and word hesitation but able to speak short sentences and follow commands.  Mild dysarthria also present.  Very slight difficulty with naming repetition.comprehension appears preserved.  Mild right lower facial weakness.  Tongue midline.  Motor system exam shows  no upper or lower extremity drift but there is mild weakness of right grip, intrinsic hand muscles and right hip flexors.  Orbits left over right upper extremity.  Sensation appears preserved bilaterally.  Deep tendon reflexes are symmetric.  Plantars downgoing.  Gait not tested.  ASSESSMENT/PLAN Ms. ANAMARIE HUNN is a 85 y.o. female with history of AF not on AC d/t uterine bleeding w/ stage 4 metastatic uterine cancer undergoing radiation, colon cancer and HTN presenting with aphasia.   Stroke:   Bilateral frontal parietal infarcts embolic secondary to known AF not on [facility]  Code Stroke CT head No acute stroke. ASPECTS 10.     MRI  L posterior frontal cortical and punctate R posterior frontal cortical infarcts. Small vessel disease. 12 mm meningioma L foramen magnum  Carotid Doppler  Canceled. Not a surgical, stent candidate  2D Echo canceled. Not an AC candidate  LDL 36  HgbA1c 4.9  SCDs for VTE prophylaxis  No antithrombotic prior to admission, now on No antithrombotic d/t uterine bleeding. Recommend aspirin 81 at this time  Dr. Leonie Man has called daughter to discuss goals of care. Also discussed w/ attending  Therapy recommendations:  pending  Disposition:  pending   Atrial Fibrillation  Home anticoagulation:  none  d/t uterine bleeding   Hypertension  Stable on the low sidse . Permissive hypertension (OK if < 220/120) but gradually normalize in 5-7 days . Long-term BP goal normotensive  Hyperlipidemia  Home meds:  lipitor 10, resumed in hospital  LDL 36, goal < 70  Continue statin at discharge  Other Stroke Risk Factors  Advanced age  Obesity, Body mass index  is 29.02 kg/m., recommend weight loss, diet and exercise as appropriate   Family hx stroke (mother)  Other Active Problems  Metastatic uterine cancer stage 4, undergoing XRT. CT abd chest new uterine fluid likely hmg w/ gas suggesting infection or abscess. New RUL pulm nodule (mets), new retroperitoneal  lymph node (mets). Minimal L pleural effusion.   Colon cancer 1996  Hypothyroid on synthroid  Leukocytosis 16.1  Hospital day # 1 I have personally obtained history,examined this patient, reviewed notes, independently viewed imaging studies, participated in medical decision making and plan of care.ROS completed by me personally and pertinent positives fully documented  I have made any additions or clarifications directly to the above note.  She presented with aphasia secondary to embolic bifrontal infarcts from atrial fibrillation and was not on anticoagulation.  She has uterine cancer with metastasis and abscess and her prognosis is very poor.  I called the patient's daughter and left a message for her to call me back.  I spoke to Dr. Marylyn Ishihara who informed me that family was interested in palliative care hence I do not believe further stroke work-up is necessary.  Consider starting aspirin 81 mg daily.D/W Dr Marylyn Ishihara and answered questions. Greater than 50% time during this 35-minute visit was spent on counseling and coordination of care about her embolic stroke discussion about atrial fibrillation and stroke prevention and treatment and answering questions. Antony Contras, MD Medical Director [facility] Pager: [phone] 05/01/2019 1:40 PM   To contact Stroke Continuity provider, please refer to [email or web address] After hours, contact General Neurology

## 2019-07-01 DEATH — deceased

## 2019-07-31 IMAGING — MG DIGITAL SCREENING BILATERAL MAMMOGRAM WITH CAD
4 series · 4 of 4 positions shown · non-contrast
Comparison: Previous exam(s).

CLINICAL DATA: Screening.

EXAM:
DIGITAL SCREENING BILATERAL MAMMOGRAM WITH CAD

[R MLO]
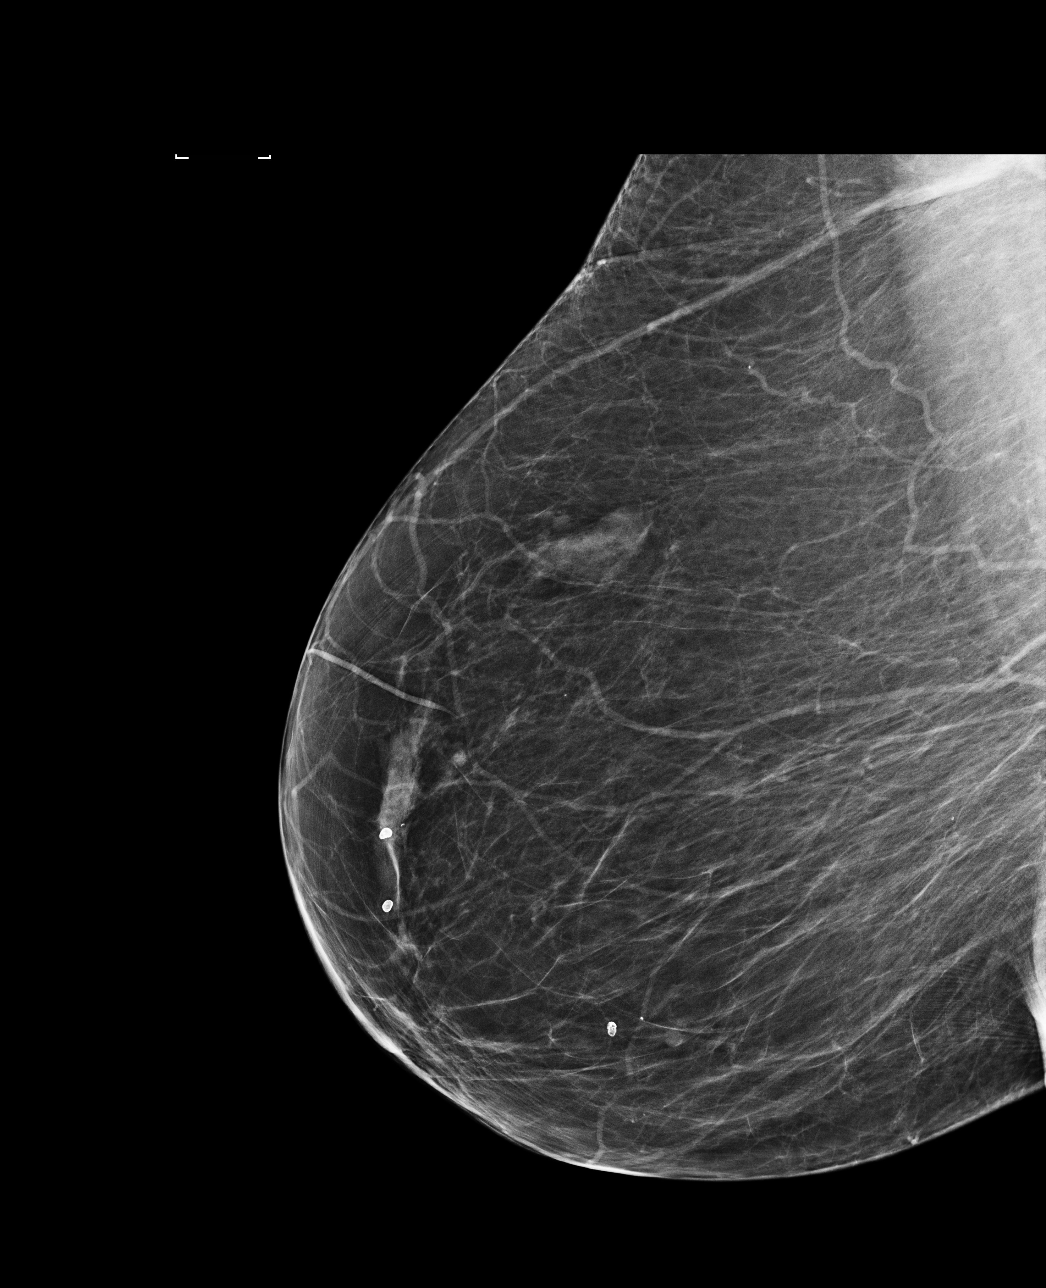

[L CC]
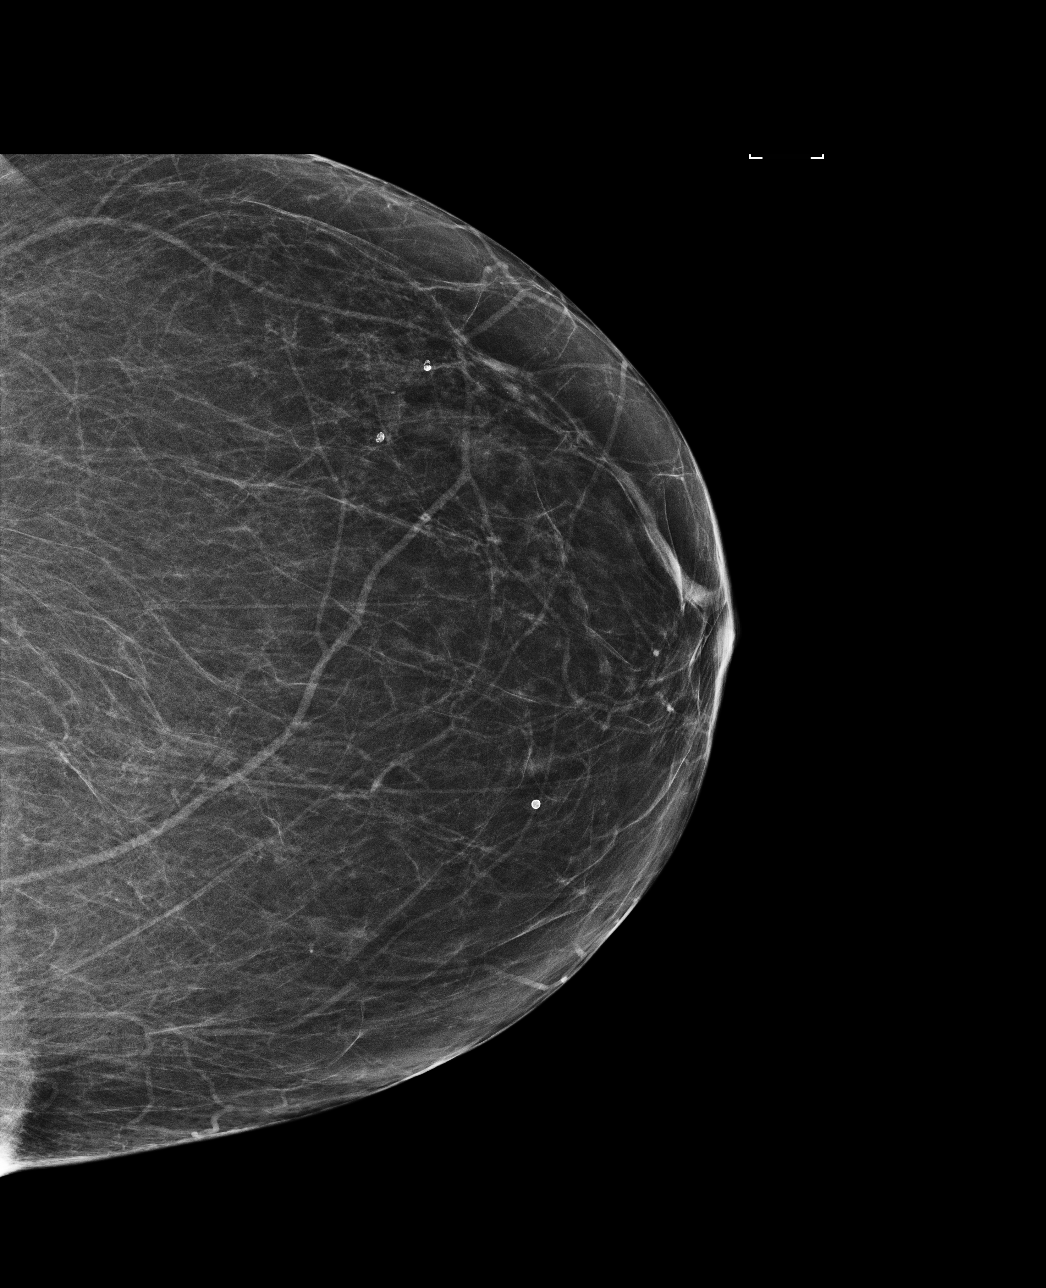

[R CC]
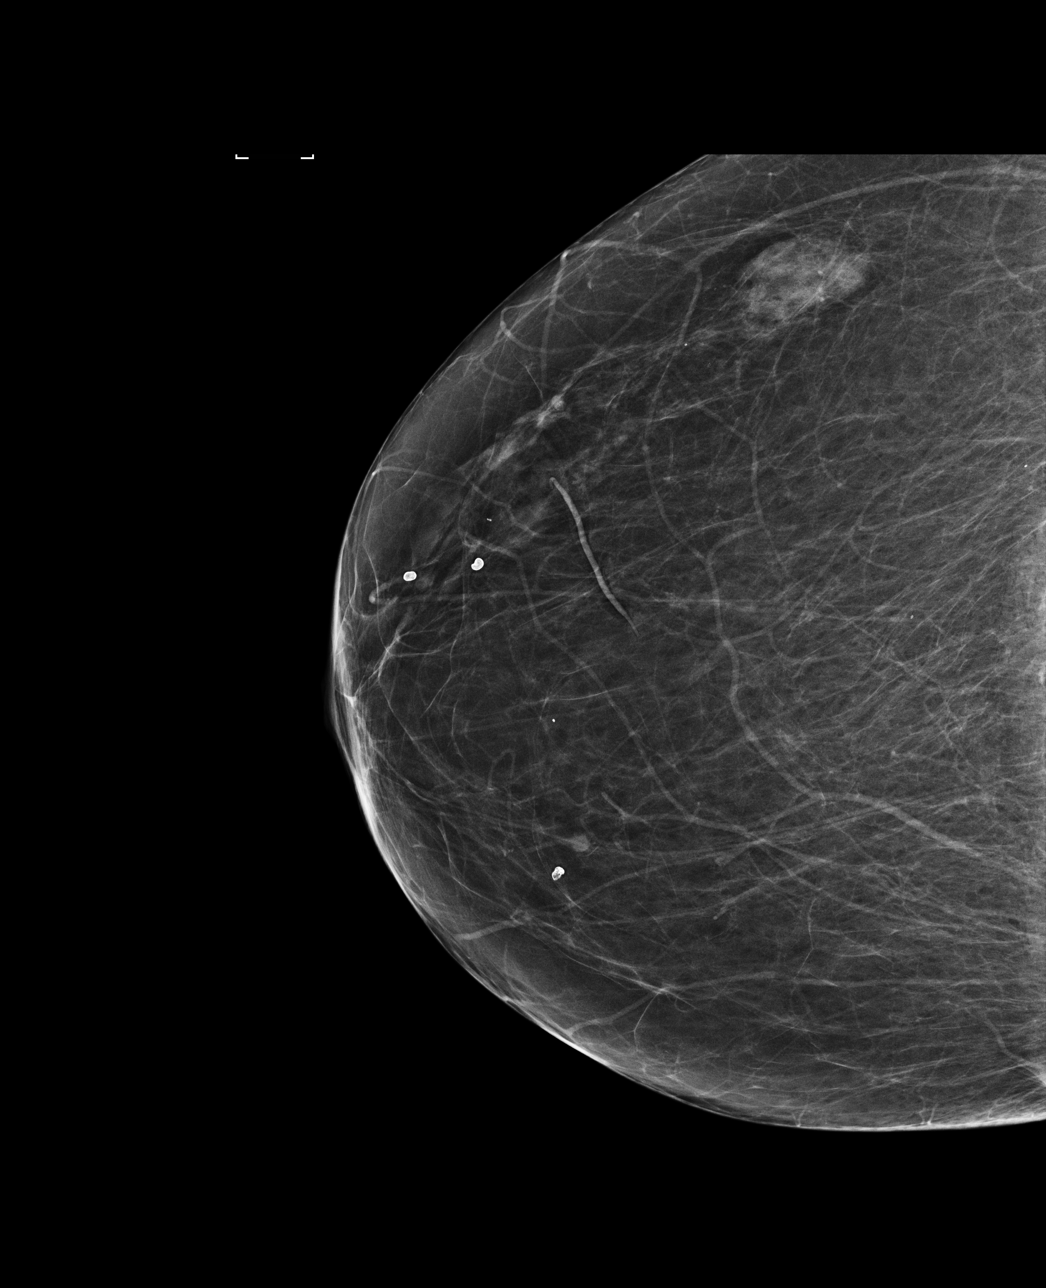

[L MLO]
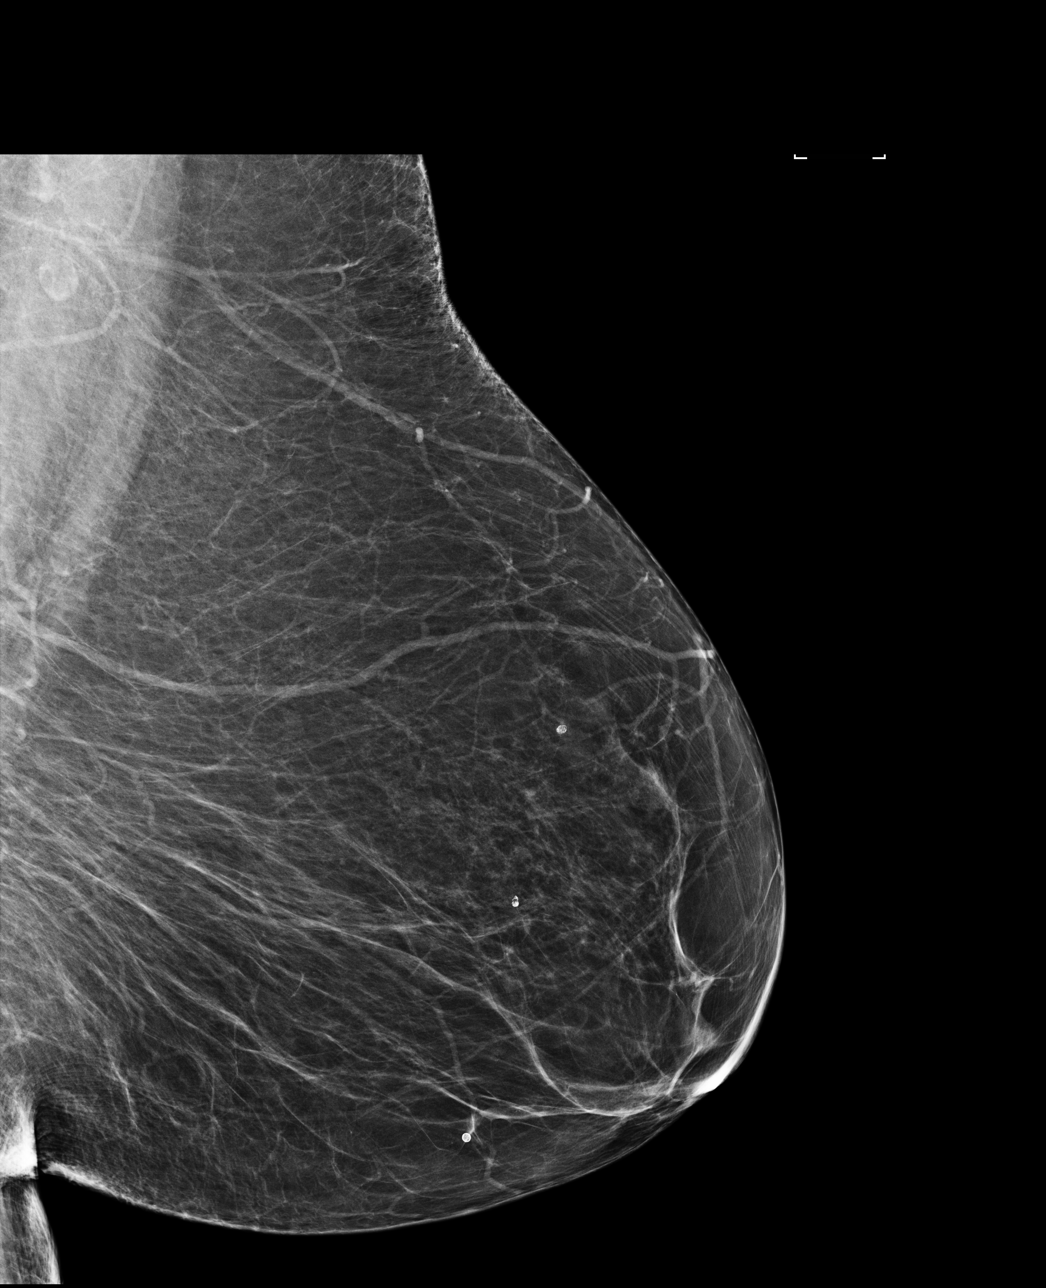

[4 of 4 positions shown; findings below may reference images not displayed]

ACR Breast Density Category b: There are scattered areas of
fibroglandular density.
FINDINGS: There are no findings suspicious for malignancy. Images were
processed with CAD.
IMPRESSION: No mammographic evidence of malignancy. A result letter of this
screening mammogram will be mailed directly to the patient.

RECOMMENDATION:
Screening mammogram in one year. (Code:AS-G-LCT)

BI-RADS CATEGORY  1: Negative.

## 2019-09-06 IMAGING — CT NM PET TUM IMG INITIAL (PI) SKULL BASE T - THIGH
1 of 7 series · 1 of 25 positions shown · non-contrast
Comparison: CT 01/30/2019

CLINICAL DATA: Initial treatment strategy for endometrial
carcinoma..

EXAM:
NUCLEAR MEDICINE PET SKULL BASE TO THIGH
TECHNIQUE: 9.8 mCi F-18 FDG was injected intravenously. Full-ring PET imaging
was performed from the skull base to thigh after the radiotracer. CT
data was obtained and used for attenuation correction and anatomic
localization.
Fasting blood glucose: 71 mg/dl

[Series 4: ct sk_thigh 5.0 b31f · axial · 5.0mm · 0.98mm/px · 1 of 227 slices shown]
[im 227/227  brain]
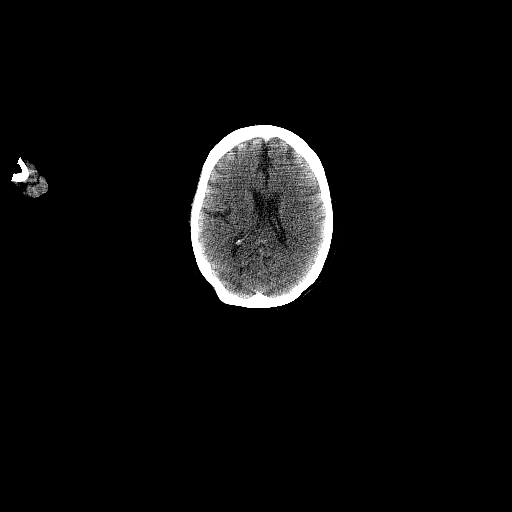

[1 of 25 positions shown; findings below may reference images not displayed]

FINDINGS: Mediastinal blood pool activity: SUV max

NECK: No hypermetabolic lymph nodes in the neck.

Incidental CT findings: none

CHEST: Hypermetabolic RIGHT internal mammary node SUV max equal 5.8.
This small node measures 8 mm (image 62/4)

Nodular implant in the epicardial fat anterior to the liver
measuring 12 mm (image [DATE]) with SUV max equal 6.8.

Incidental CT findings: No suspicious pulmonary nodules.

ABDOMEN/PELVIS: Low-density lesions in liver without focal metabolic
activity.

Hypermetabolic lymph node at the crus of the diaphragm adjacent to
gastrohepatic ligament measuring 13 mm (image 105/4) with SUV max
equal 17.1 (image 105 of fused data set).

Peritoneal nodule in the ventral peritoneal space of the lower
abdomen measuring 10 mm (image 164/4) with SUV max equal 9.3.

Hypermetabolic RIGHT inguinal lymph node (11 mm, image 172/4) with
SUV max equal 18.4.

Intense hypermetabolic activity within the uterine body with SUV max
equal 22.

Incidental CT findings: none

SKELETON: No focal hypermetabolic activity to suggest skeletal
metastasis.

Incidental CT findings: none
IMPRESSION: 1. Intensely hypermetabolic activity within the uterine
bodyconsistent with uterine/endometrial carcinoma.
2. Scattered small hypermetabolic nodular/nodal metastatic deposits
above and below the diaphragm. Deposits include a RIGHT internal
mammary node, epicardial node, gastrohepatic ligament node, lower
abdomen ventral peritoneal node, and RIGHT inguinal node.
3. No lung metastasis, liver metastasis or skeletal metastasis.

## 2019-11-23 IMAGING — CT CT HEAD CODE STROKE W/O CM
4 of 7 series · 14 of 47 positions shown, 16 images · non-contrast
Comparison: None.

CLINICAL DATA: Code stroke. Difficulty breathing, last seen normal
earlier this morning, altered mental status. No obvious weakness of
arms or legs. Expressive aphasia. Atrial fibrillation. Uterine
sarcoma.

EXAM:
CT HEAD WITHOUT CONTRAST
TECHNIQUE: Contiguous axial images were obtained from the base of the skull
through the vertex without intravenous contrast.

[Series 3: head 5.0 st · axial · 0.39mm/px · z∈[-85,+40]mm · 6 of 36 slices shown, 8 images]
[im 6/36  brain]
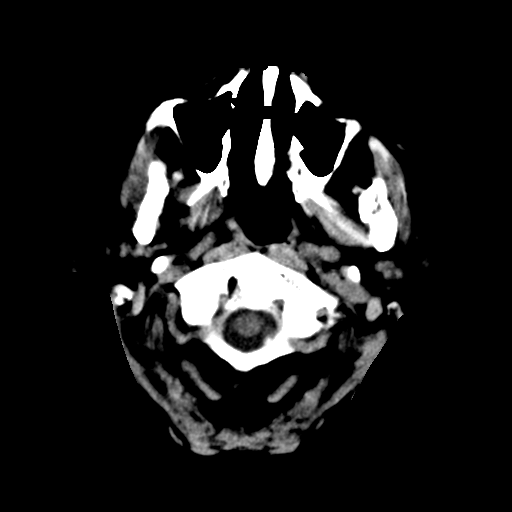
[im 6/36  bone]
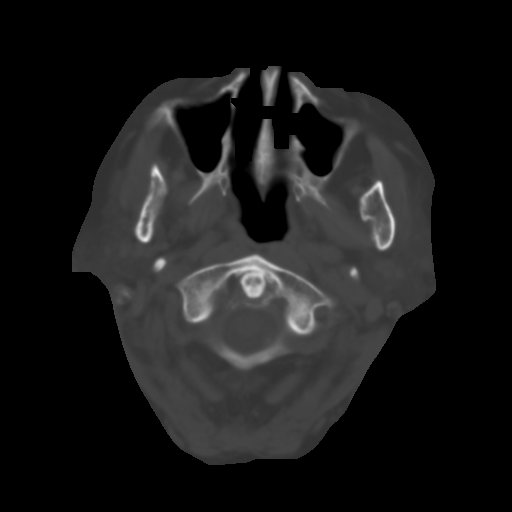
[im 11/36  brain]
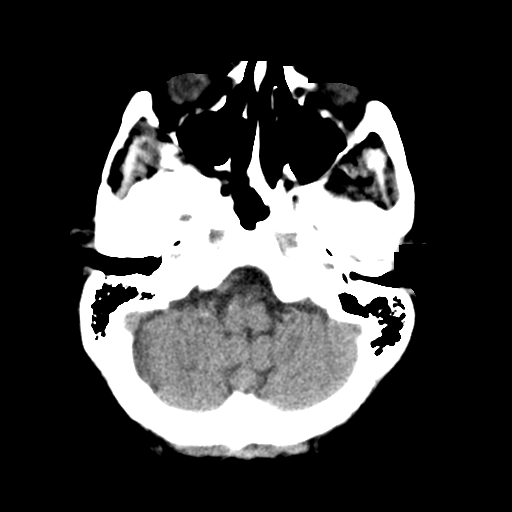
[im 16/36  brain]
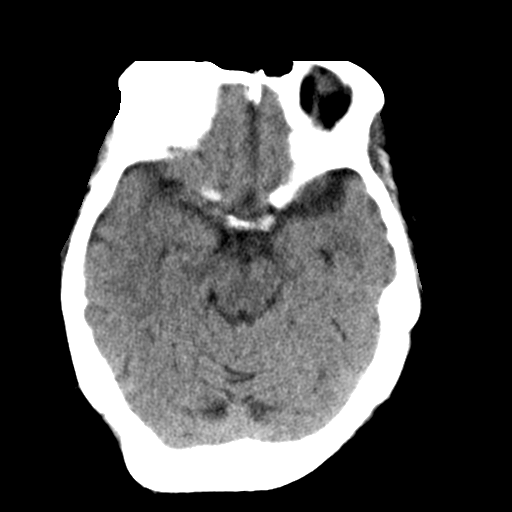
[im 21/36  brain]
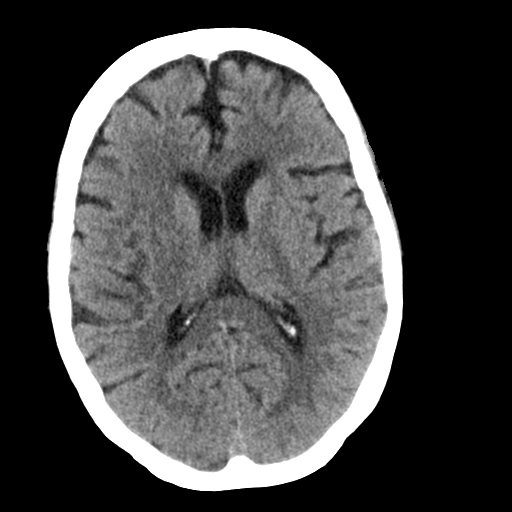
[im 26/36  brain]
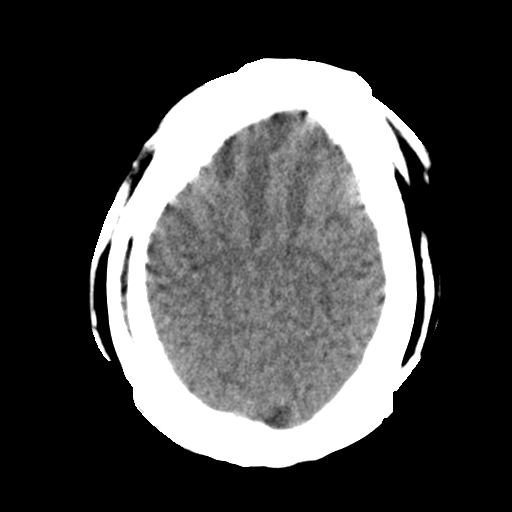
[im 26/36  bone]
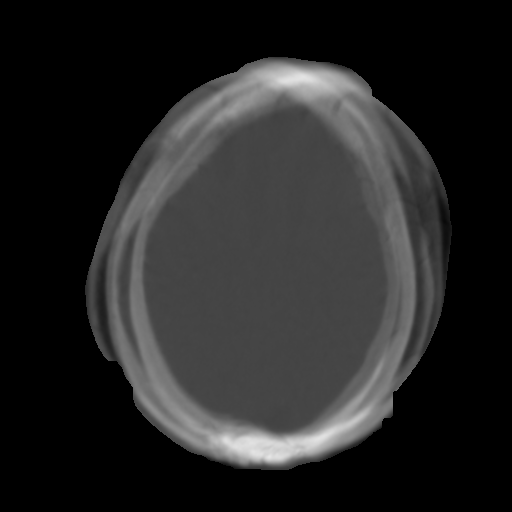
[im 31/36  brain]
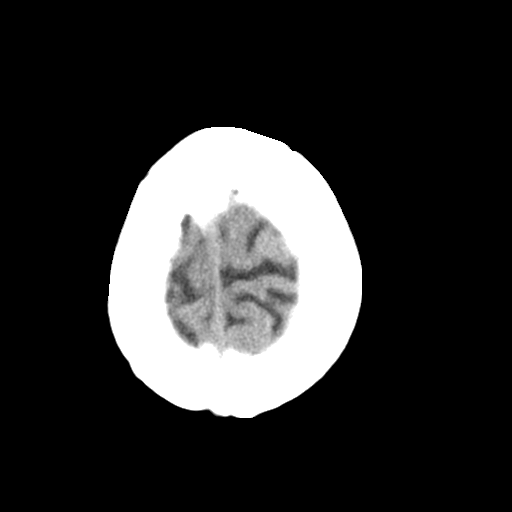

[Series 4: head 2.0 bone · axial · 0.39mm/px · z∈[-92,-52]mm · 3 of 90 slices shown]
[im 10/90  bone]
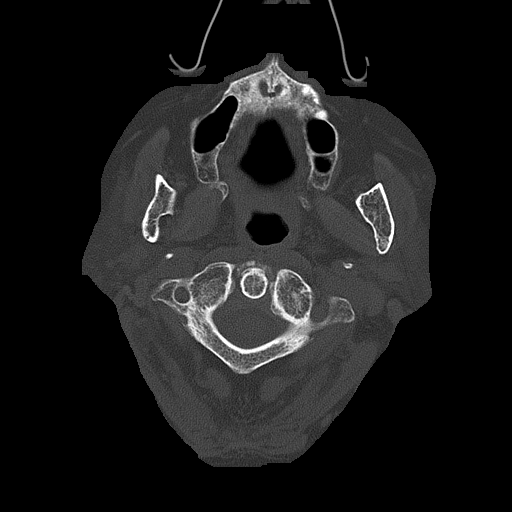
[im 20/90  bone]
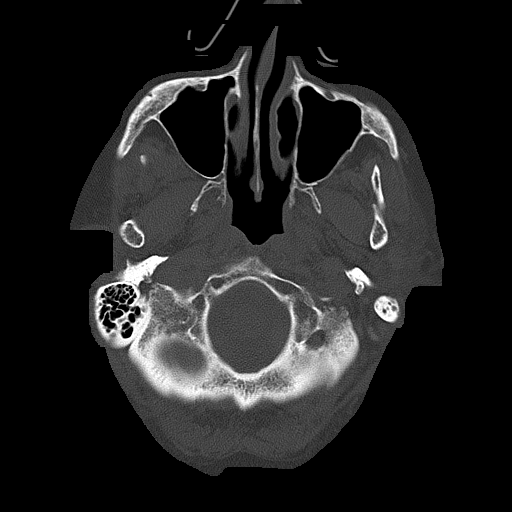
[im 30/90  bone]
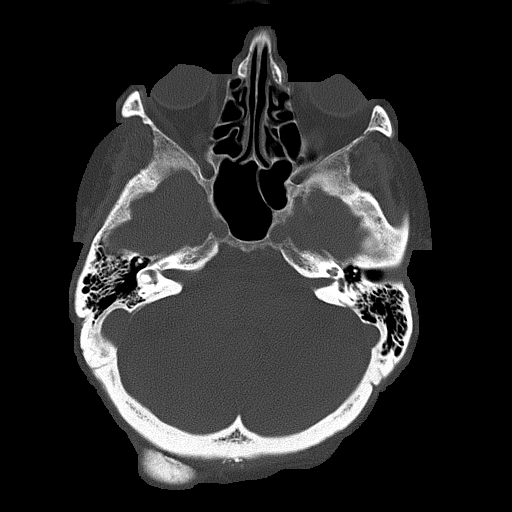

[Series 5: head 3.0 cor st · coronal · 0.35mm/px · 3 of 67 slices shown]
[im 17/67  brain]
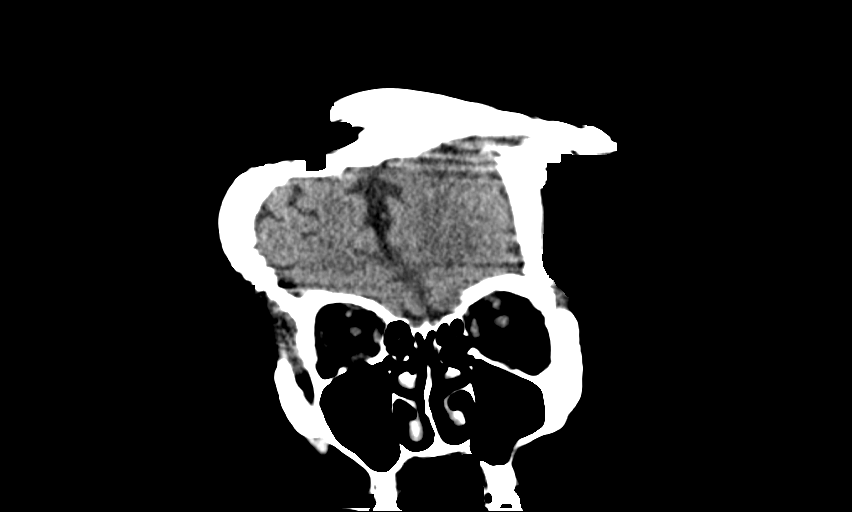
[im 34/67  brain]
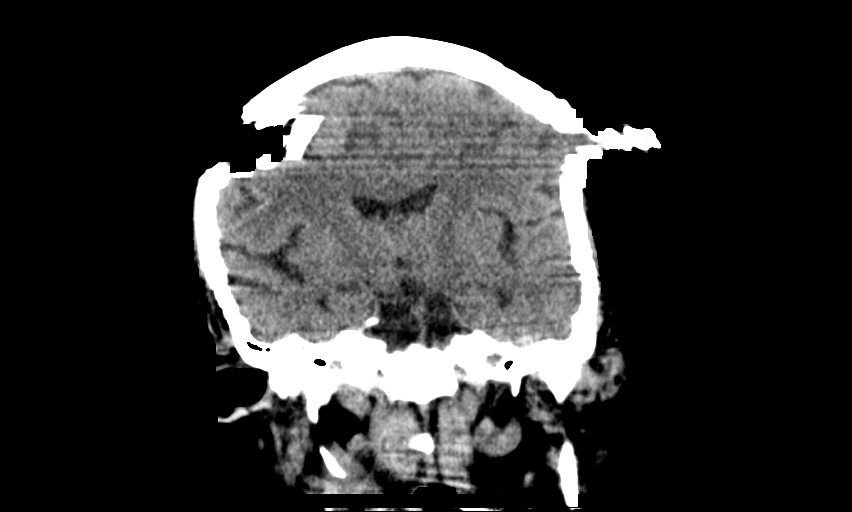
[im 50/67  brain]
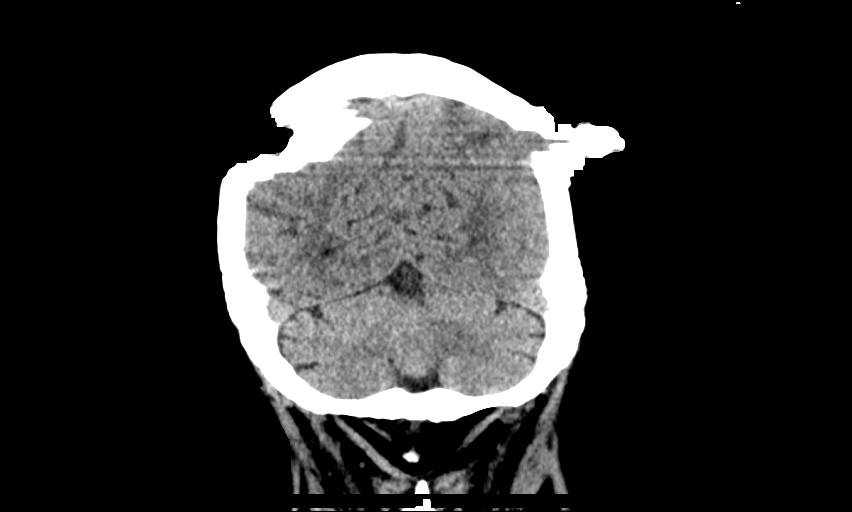

[Series 10: head 3.0 sag st · sagittal · 0.35mm/px · 2 of 66 slices shown]
[im 22/66  brain]
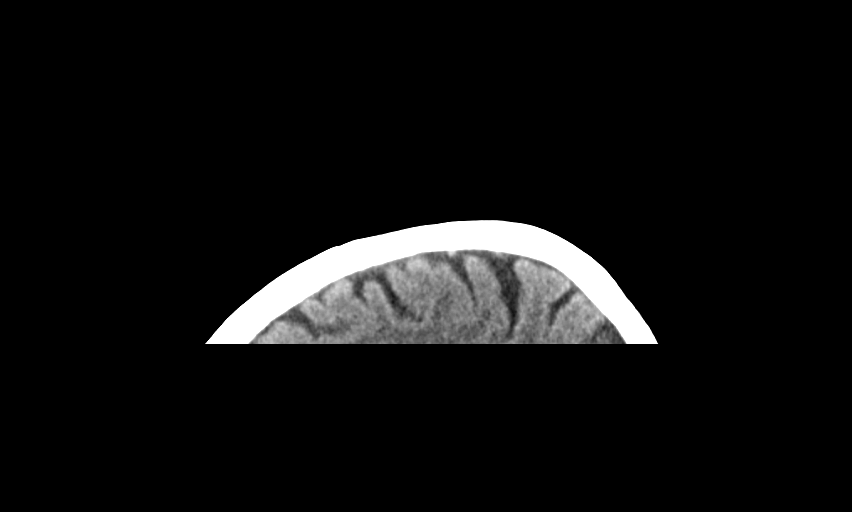
[im 44/66  brain]
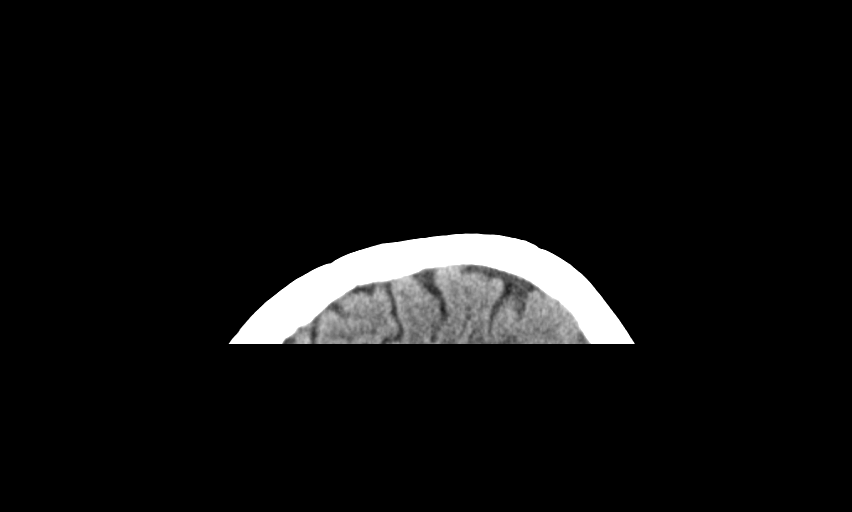

[14 of 47 positions shown; findings below may reference images not displayed]

FINDINGS: Significant motion degradation. Study is of reduced diagnostic
sensitivity and specificity.

Brain: No evidence for acute infarction, hemorrhage, mass lesion,
hydrocephalus, or extra-axial fluid. Generalized atrophy.
Hypoattenuation of white matter, likely small vessel disease.

Vascular: No definite hypertensive vessel.

Skull: No definite destructive lesion. Large osteoma, RIGHT
occipital bone, projecting exterior.

Sinuses/Orbits: No gross sinus fluid.  Negative orbits.

Other: None.

ASPECTS ([place] Stroke Program Early CT Score)

- Ganglionic level infarction (caudate, lentiform nuclei, internal
capsule, insula, M1-M3 cortex): 7

- Supraganglionic infarction (M4-M6 cortex): 3

Total score (0-10 with 10 being normal): 10
IMPRESSION: 1. Suboptimal study demonstrating no definite acute stroke or large
vessel occlusion. Diagnostic accuracy is limited however given the
motion degradation.
2. ASPECTS is 10.
3. These results were communicated to Dr. Jakub at [DATE] pmon
04/30/2019by text page via the [employer] messaging system.

## 2019-11-23 IMAGING — CT CT ANGIOGRAPHY CHEST
1 of 7 series · 2 of 16 positions shown · IV contrast (omnipaque)
Comparison: PET scan of February 11, 2019. CT scan of January 30, 2019.

CLINICAL DATA: Acute generalized abdominal pain.

EXAM:
CT ANGIOGRAPHY CHEST
CT ABDOMEN AND PELVIS WITH CONTRAST
TECHNIQUE: Multidetector CT imaging of the chest was performed using the
standard protocol during bolus administration of intravenous
contrast. Multiplanar CT image reconstructions and MIPs were
obtained to evaluate the vascular anatomy. Multidetector CT imaging
of the abdomen and pelvis was performed using the standard protocol
during bolus administration of intravenous contrast.
CONTRAST:  100mL OMNIPAQUE IOHEXOL 350 MG/ML SOLN

[Series 7: pe thins · axial · 0.69mm/px · z∈[-707,-628]mm · 2 of 238 slices shown]
[im 80/238  lung]
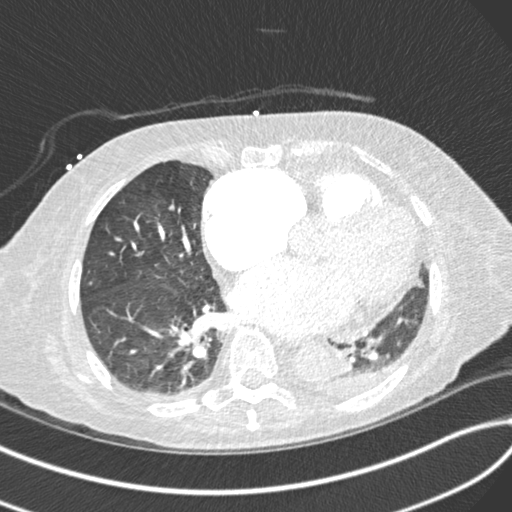
[im 159/238  soft-tissue]
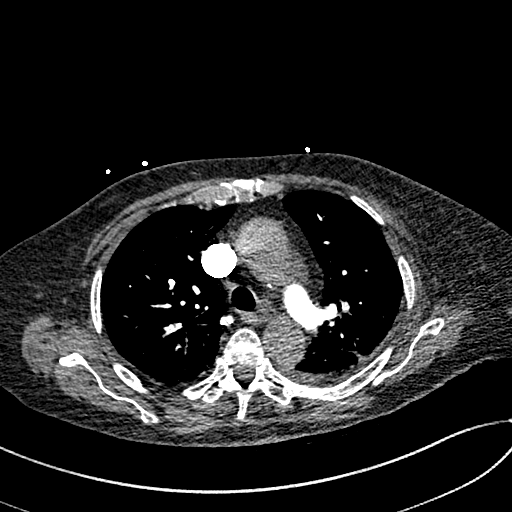

[2 of 16 positions shown; findings below may reference images not displayed]

FINDINGS: CTA CHEST FINDINGS

Cardiovascular: Satisfactory opacification of the pulmonary arteries
to the segmental level. No evidence of pulmonary embolism. Mild
cardiomegaly. No pericardial effusion.

Mediastinum/Nodes: No enlarged mediastinal, hilar, or axillary lymph
nodes. Thyroid gland, trachea, and esophagus demonstrate no
significant findings.

Lungs/Pleura: No pneumothorax is noted. Minimal left pleural
effusion is noted with adjacent subsegmental atelectasis of the left
lower lobe. New 1 cm subpleural nodule is noted in the right upper
lobe concerning for possible metastatic disease. Mild lingular
subsegmental atelectasis or infiltrate is noted.

Musculoskeletal: No chest wall abnormality. No acute or significant
osseous findings.

Review of the MIP images confirms the above findings.

CT ABDOMEN and PELVIS FINDINGS

Hepatobiliary: Status post cholecystectomy. Stable intrahepatic and
extrahepatic biliary dilatation is noted. Probable stones are noted
in the distal common bile duct consistent with choledocholithiasis.
Stable hepatic cysts are noted compared to prior exam.

Pancreas: Unremarkable. No pancreatic ductal dilatation or
surrounding inflammatory changes.

Spleen: Normal in size without focal abnormality.

Adrenals/Urinary Tract: Adrenal glands appear normal. Nonobstructive
left renal calculus is noted. No hydronephrosis or renal obstruction
is noted. Stable right renal cysts are noted. Urinary bladder is
unremarkable.

Stomach/Bowel: Stomach appears normal. There is no evidence of bowel
obstruction or inflammation. The appendix is not visualized.

Vascular/Lymphatic: No significant vascular abnormality is noted.
Stable celiac lymph node is noted measuring 11 mm. Interval
development of 16 mm lymph node in pre aortic space concerning for
metastatic disease.

Reproductive: There is interval development of large amount of fluid
within the uterus that measures 20 x 8 cm. There is some high
density material within this suggesting possible hematoma with small
amount of gas present suggesting possible infection or abscess.
Probable hydrosalpinx is seen on the right. No left adnexal
abnormality is noted.

Other: No abdominal wall hernia or abnormality. No abdominopelvic
ascites.

Musculoskeletal: No acute or significant osseous findings.

Review of the MIP images confirms the above findings.
IMPRESSION: No definite evidence of pulmonary embolus.

Interval development of foot appears to be large amount of complex
fluid and gas within the uterus which measures 20 x 8 cm. Some high
density material is noted suggesting hemorrhage, with some gas
present is well suggesting possible infection or abscess. Clinical
correlation is recommended.

New 1 cm right upper lobe pulmonary nodule is noted concerning for
metastatic disease.

New 16 mm preaortic lymph node is noted in the retroperitoneal space
concerning for metastatic disease.

Minimal left pleural effusion is noted with adjacent subsegmental
atelectasis of the left lower lobe.

Stable intrahepatic and extrahepatic biliary dilatation is noted
secondary to choledocholithiasis.

Nonobstructive left renal calculus. No hydronephrosis or renal
obstruction is noted.

## 2019-11-23 IMAGING — CT CT ABDOMEN AND PELVIS WITH CONTRAST
2 of 3 series · 14 of 46 positions shown, 16 images · IV contrast (omnipaque)
Comparison: PET scan of February 11, 2019. CT scan of January 30, 2019.

CLINICAL DATA: Acute generalized abdominal pain.

EXAM:
CT ANGIOGRAPHY CHEST
CT ABDOMEN AND PELVIS WITH CONTRAST
TECHNIQUE: Multidetector CT imaging of the chest was performed using the
standard protocol during bolus administration of intravenous
contrast. Multiplanar CT image reconstructions and MIPs were
obtained to evaluate the vascular anatomy. Multidetector CT imaging
of the abdomen and pelvis was performed using the standard protocol
during bolus administration of intravenous contrast.
CONTRAST:  100mL OMNIPAQUE IOHEXOL 350 MG/ML SOLN

[Series 3: a/p w/ 5mm · axial · 0.87mm/px · z∈[-1136,-701]mm · 11 of 101 slices shown, 13 images]
[im 7/101  soft-tissue]
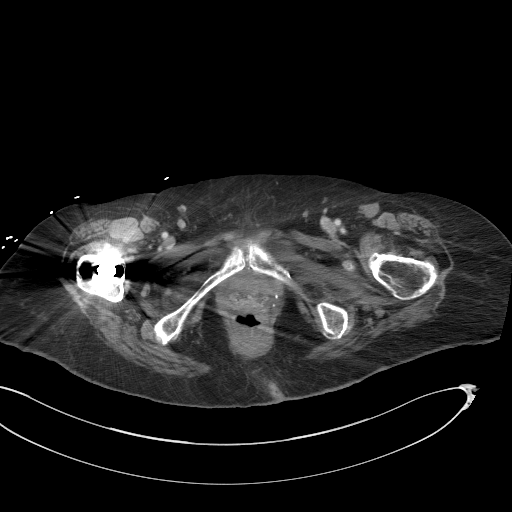
[im 7/101  bone]
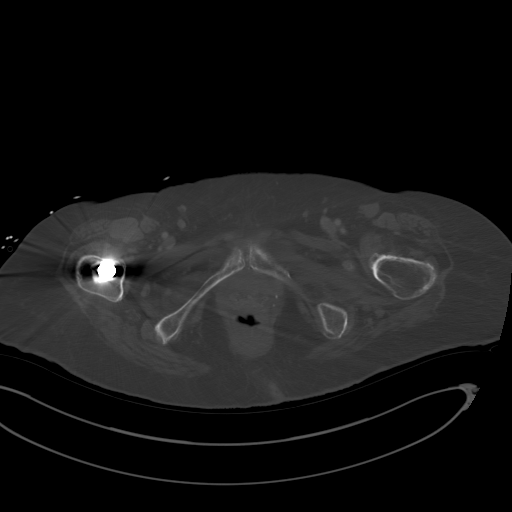
[im 17/101  soft-tissue]
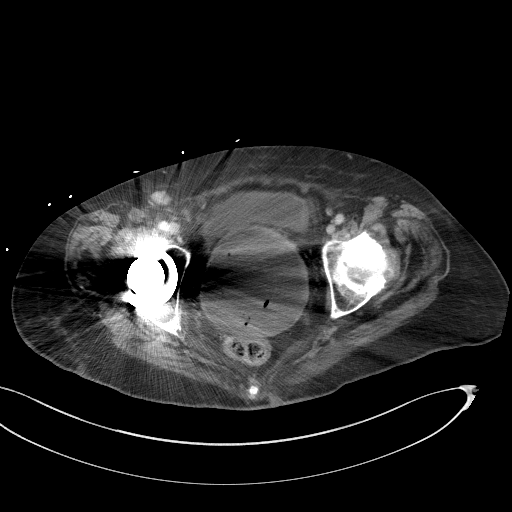
[im 23/101  soft-tissue]
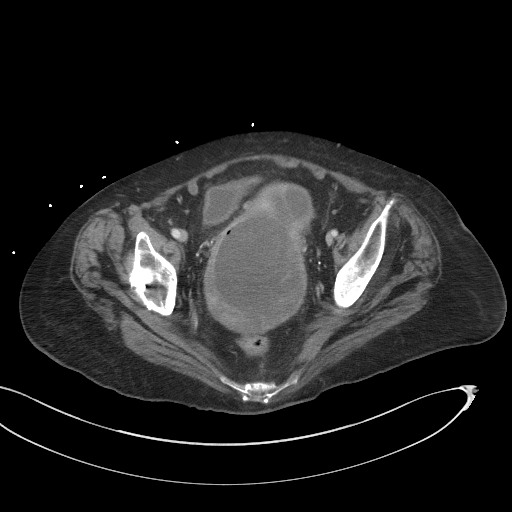
[im 33/101  soft-tissue]
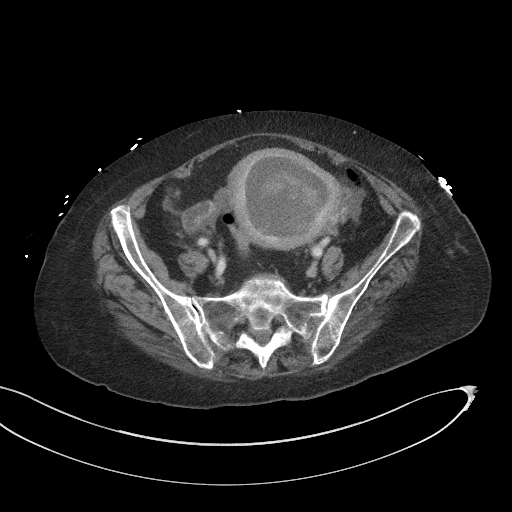
[im 42/101  soft-tissue]
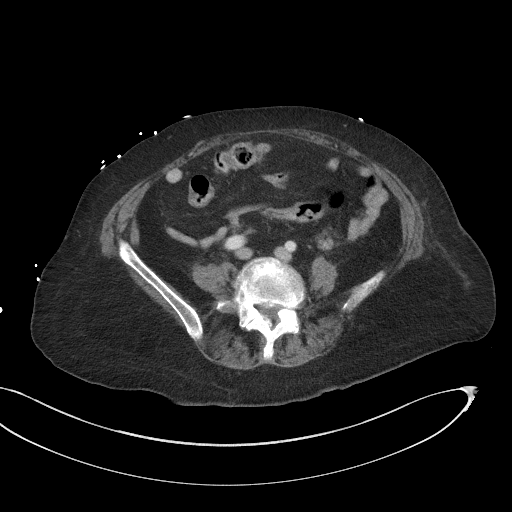
[im 52/101  soft-tissue]
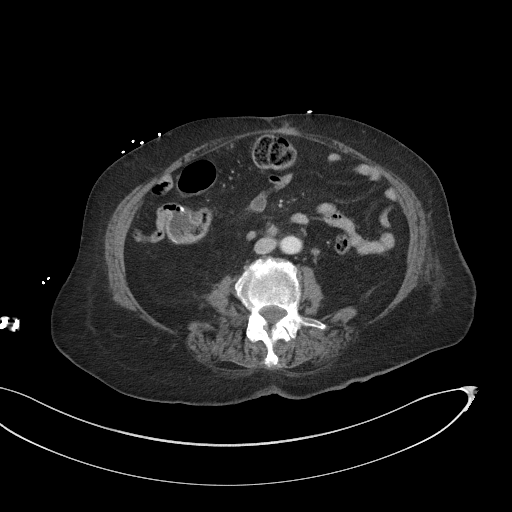
[im 59/101  soft-tissue]
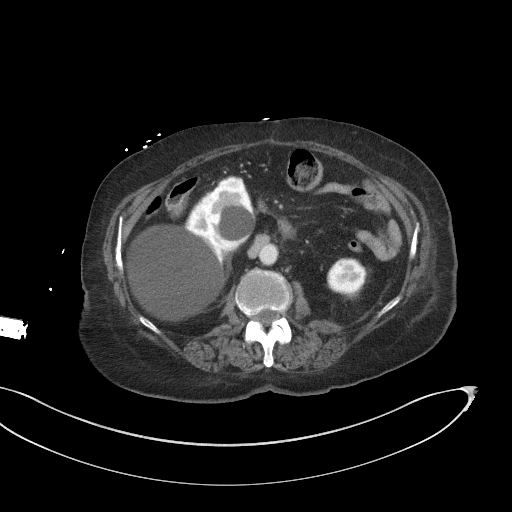
[im 68/101  soft-tissue]
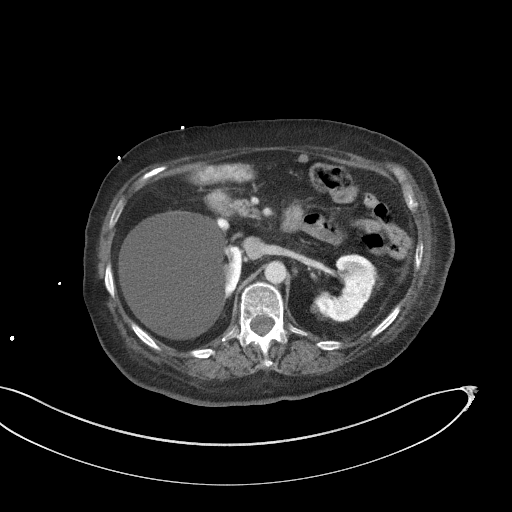
[im 78/101  soft-tissue]
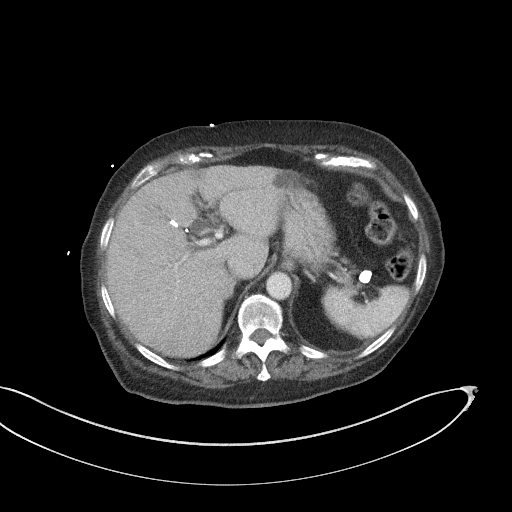
[im 78/101  bone]
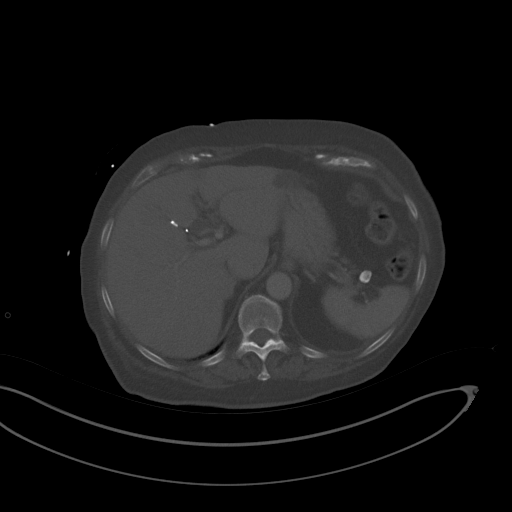
[im 84/101  soft-tissue]
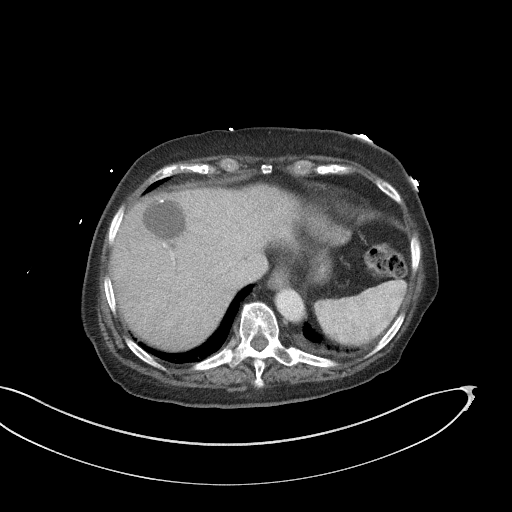
[im 94/101  soft-tissue]
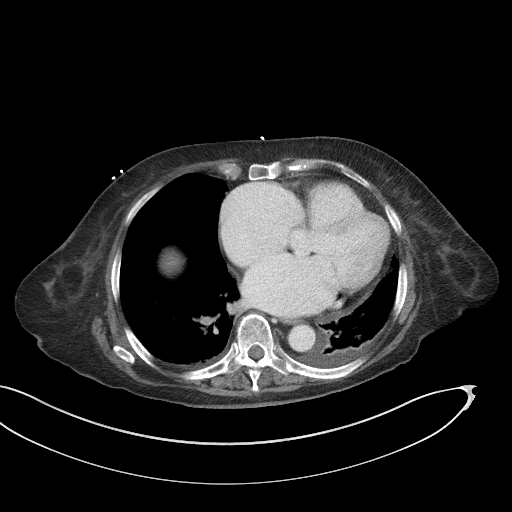

[Series 6: a/p w/ cor · coronal · 0.99mm/px · 3 of 151 slices shown]
[im 51/151  soft-tissue]
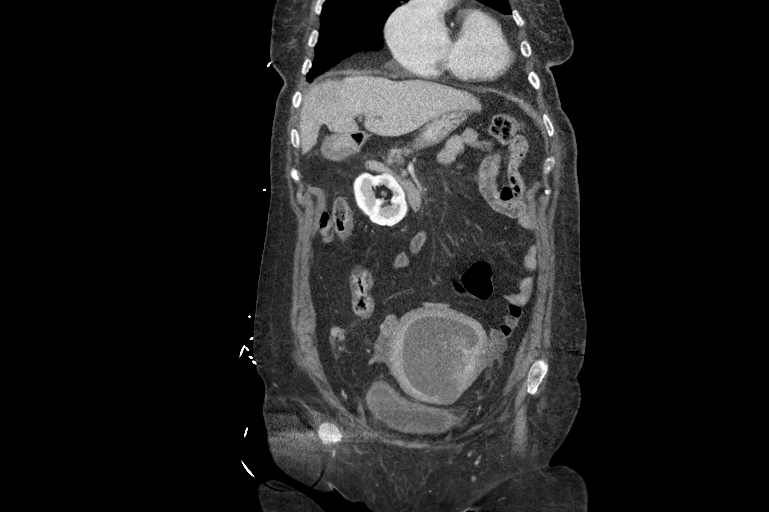
[im 67/151  soft-tissue]
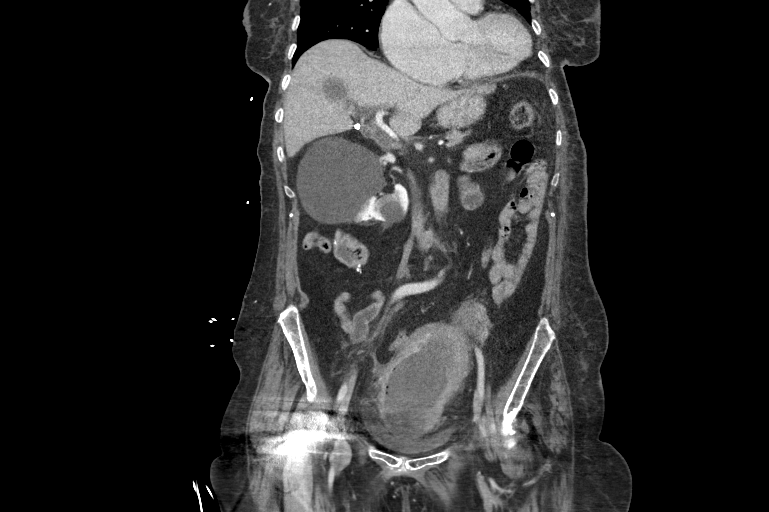
[im 84/151  soft-tissue]
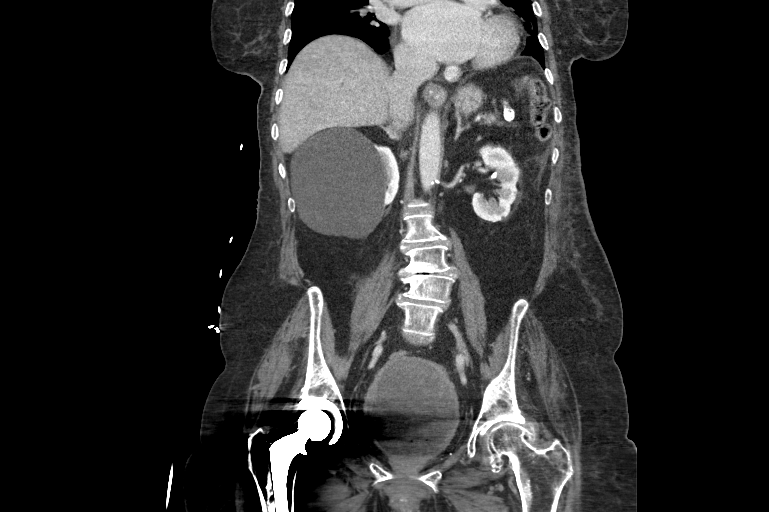

[14 of 46 positions shown; findings below may reference images not displayed]

FINDINGS: CTA CHEST FINDINGS

Cardiovascular: Satisfactory opacification of the pulmonary arteries
to the segmental level. No evidence of pulmonary embolism. Mild
cardiomegaly. No pericardial effusion.

Mediastinum/Nodes: No enlarged mediastinal, hilar, or axillary lymph
nodes. Thyroid gland, trachea, and esophagus demonstrate no
significant findings.

Lungs/Pleura: No pneumothorax is noted. Minimal left pleural
effusion is noted with adjacent subsegmental atelectasis of the left
lower lobe. New 1 cm subpleural nodule is noted in the right upper
lobe concerning for possible metastatic disease. Mild lingular
subsegmental atelectasis or infiltrate is noted.

Musculoskeletal: No chest wall abnormality. No acute or significant
osseous findings.

Review of the MIP images confirms the above findings.

CT ABDOMEN and PELVIS FINDINGS

Hepatobiliary: Status post cholecystectomy. Stable intrahepatic and
extrahepatic biliary dilatation is noted. Probable stones are noted
in the distal common bile duct consistent with choledocholithiasis.
Stable hepatic cysts are noted compared to prior exam.

Pancreas: Unremarkable. No pancreatic ductal dilatation or
surrounding inflammatory changes.

Spleen: Normal in size without focal abnormality.

Adrenals/Urinary Tract: Adrenal glands appear normal. Nonobstructive
left renal calculus is noted. No hydronephrosis or renal obstruction
is noted. Stable right renal cysts are noted. Urinary bladder is
unremarkable.

Stomach/Bowel: Stomach appears normal. There is no evidence of bowel
obstruction or inflammation. The appendix is not visualized.

Vascular/Lymphatic: No significant vascular abnormality is noted.
Stable celiac lymph node is noted measuring 11 mm. Interval
development of 16 mm lymph node in pre aortic space concerning for
metastatic disease.

Reproductive: There is interval development of large amount of fluid
within the uterus that measures 20 x 8 cm. There is some high
density material within this suggesting possible hematoma with small
amount of gas present suggesting possible infection or abscess.
Probable hydrosalpinx is seen on the right. No left adnexal
abnormality is noted.

Other: No abdominal wall hernia or abnormality. No abdominopelvic
ascites.

Musculoskeletal: No acute or significant osseous findings.

Review of the MIP images confirms the above findings.
IMPRESSION: No definite evidence of pulmonary embolus.

Interval development of foot appears to be large amount of complex
fluid and gas within the uterus which measures 20 x 8 cm. Some high
density material is noted suggesting hemorrhage, with some gas
present is well suggesting possible infection or abscess. Clinical
correlation is recommended.

New 1 cm right upper lobe pulmonary nodule is noted concerning for
metastatic disease.

New 16 mm preaortic lymph node is noted in the retroperitoneal space
concerning for metastatic disease.

Minimal left pleural effusion is noted with adjacent subsegmental
atelectasis of the left lower lobe.

Stable intrahepatic and extrahepatic biliary dilatation is noted
secondary to choledocholithiasis.

Nonobstructive left renal calculus. No hydronephrosis or renal
obstruction is noted.

## 2019-11-23 IMAGING — MR MRI HEAD WITHOUT CONTRAST
10 of 11 series · 43 of 48 positions shown · non-contrast
Comparison: Head CT same day

CLINICAL DATA: History of colon cancer and uterine cancer. Atrial
fibrillation. Altered mental status. Expressive aphasia and
generalized weakness.

EXAM:
MRI HEAD WITHOUT CONTRAST
TECHNIQUE: Multiplanar, multiecho pulse sequences of the brain and surrounding
structures were obtained without intravenous contrast.

[Series 5: DWI · axial · 3.0mm · 0.88mm/px · z∈[-68,+77]mm · 10 of 100 slices shown (1 of 4)]
[im 1/100]
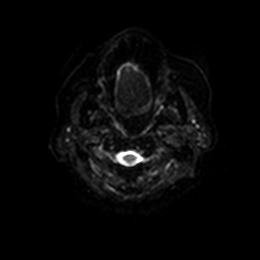
[im 12/100]
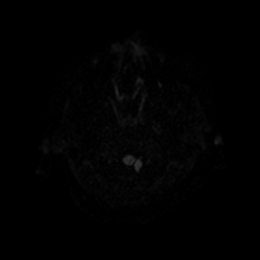
[im 23/100]
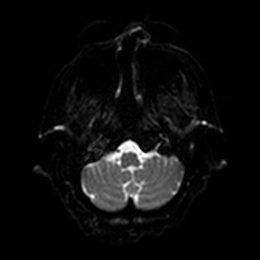
[im 34/100]
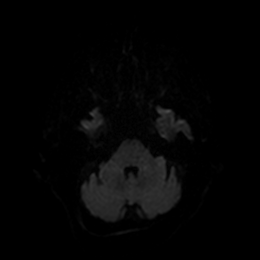
[im 45/100]
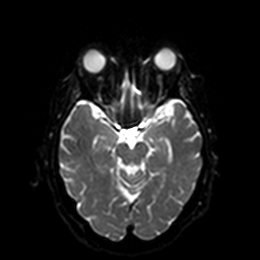
[im 56/100]
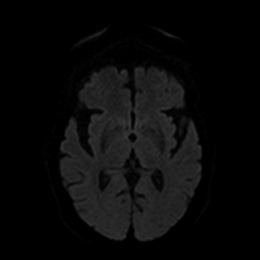
[im 67/100]
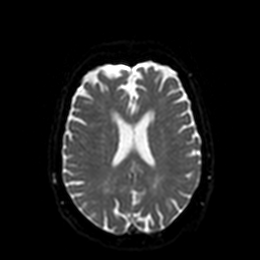
[im 78/100]
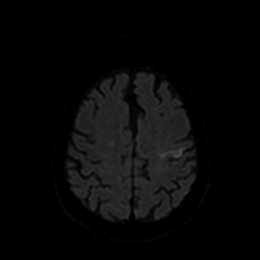
[im 89/100]
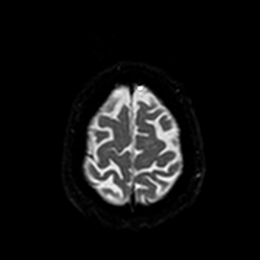
[im 100/100]
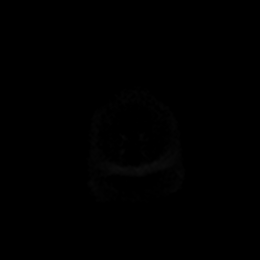

[Series 6: DWI · axial · 3.0mm · 0.88mm/px · z∈[-68,+77]mm · 5 of 50 slices shown (2 of 4)]
[im 1/50]
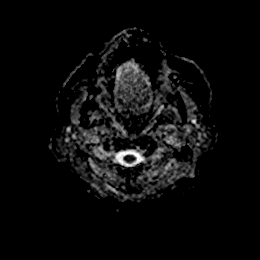
[im 13/50]
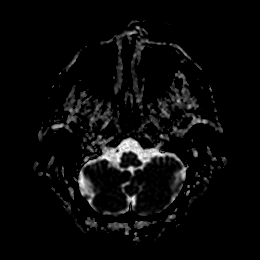
[im 25/50]
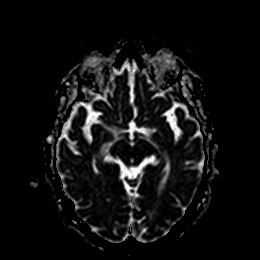
[im 37/50]
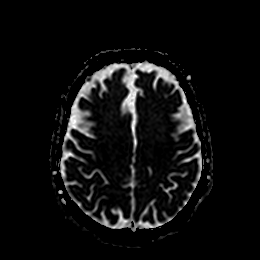
[im 50/50]
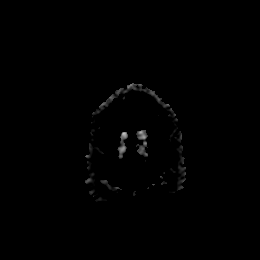

[Series 8: pha_images · axial · 3.0mm · 0.90mm/px · z∈[-71,+78]mm · 5 of 51 slices shown]
[im 1/51]
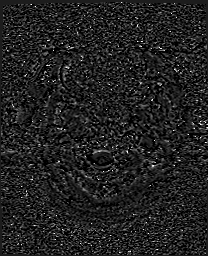
[im 13/51]
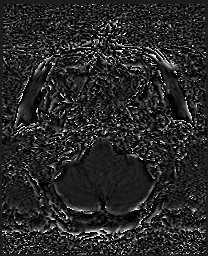
[im 26/51]
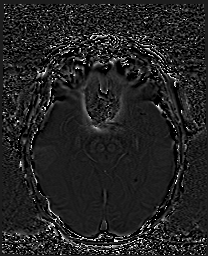
[im 38/51]
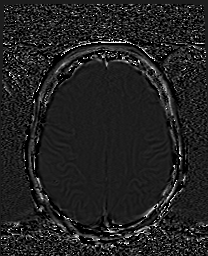
[im 51/51]
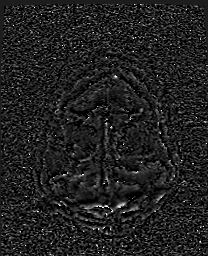

[Series 9: swi_images · axial · 3.0mm · 0.90mm/px · z∈[-74,+78]mm · 5 of 52 slices shown]
[im 1/52]
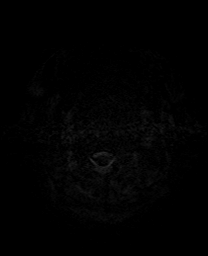
[im 13/52]
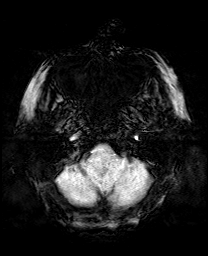
[im 26/52]
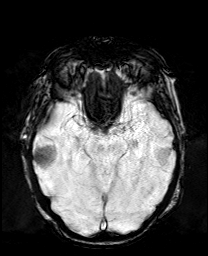
[im 39/52]
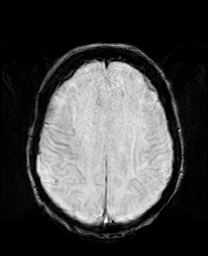
[im 52/52]
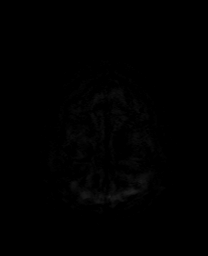

[Series 11: FLAIR · axial · 5.0mm · 0.45mm/px · z∈[-58,+85]mm · 2 of 25 slices shown]
[im 1/25]
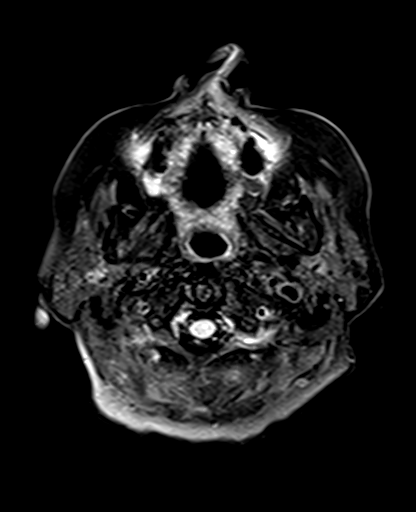
[im 25/25]
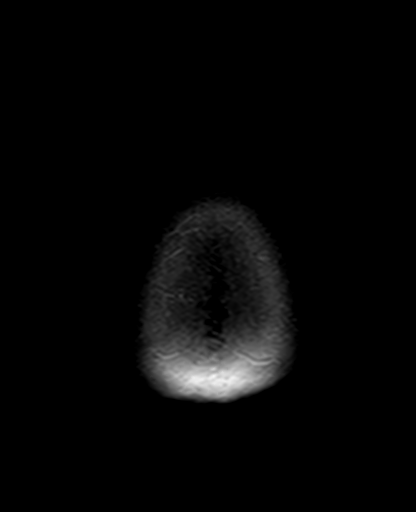

[Series 12: T2 · axial · 5.0mm · 0.72mm/px · z∈[-58,+85]mm · 2 of 25 slices shown (1 of 2)]
[im 1/25]
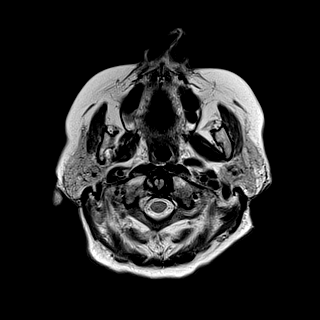
[im 25/25]
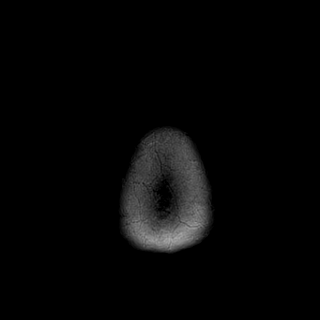

[Series 13: DWI · coronal · 4.0mm · 0.88mm/px · 6 of 64 slices shown (3 of 4)]
[im 1/64]
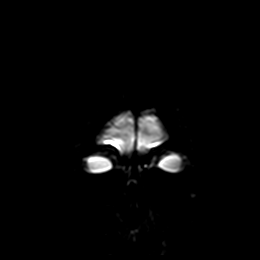
[im 13/64]
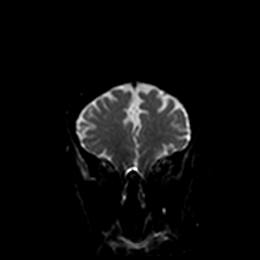
[im 26/64]
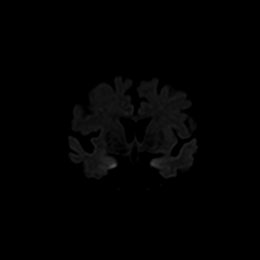
[im 38/64]
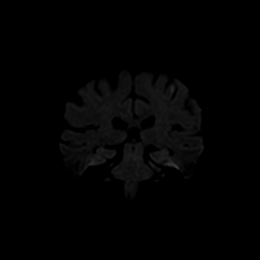
[im 51/64]
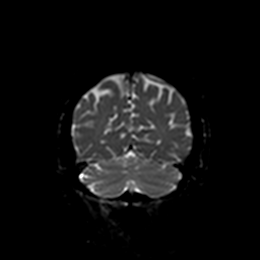
[im 64/64]
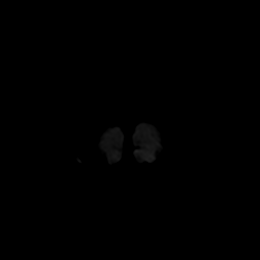

[Series 14: DWI · coronal · 4.0mm · 0.88mm/px · 3 of 32 slices shown (4 of 4)]
[im 1/32]
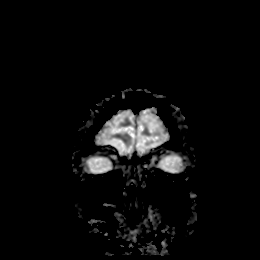
[im 16/32]
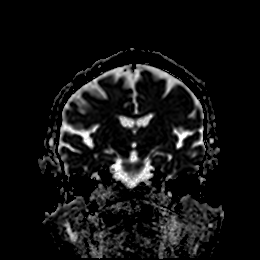
[im 32/32]
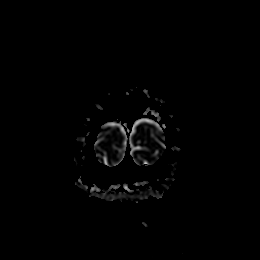

[Series 15: T1 · sagittal · 5.0mm · 0.75mm/px · 2 of 23 slices shown]
[im 1/23]
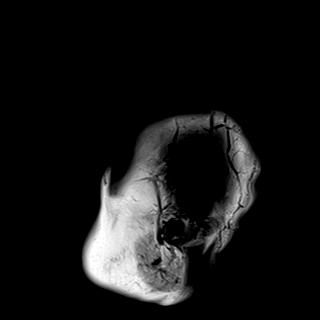
[im 23/23]
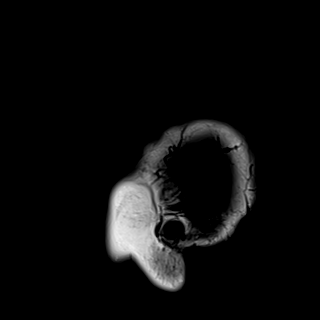

[Series 17: T2 · coronal · 5.0mm · 0.34mm/px · 3 of 27 slices shown (2 of 2)]
[im 1/27]
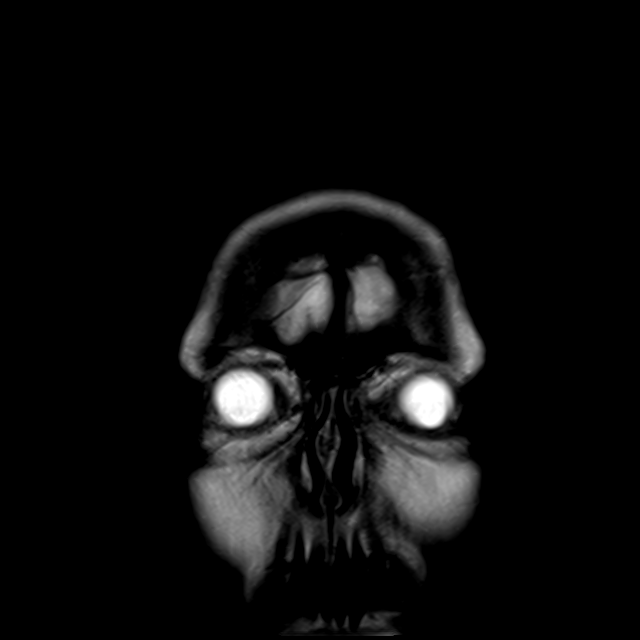
[im 14/27]
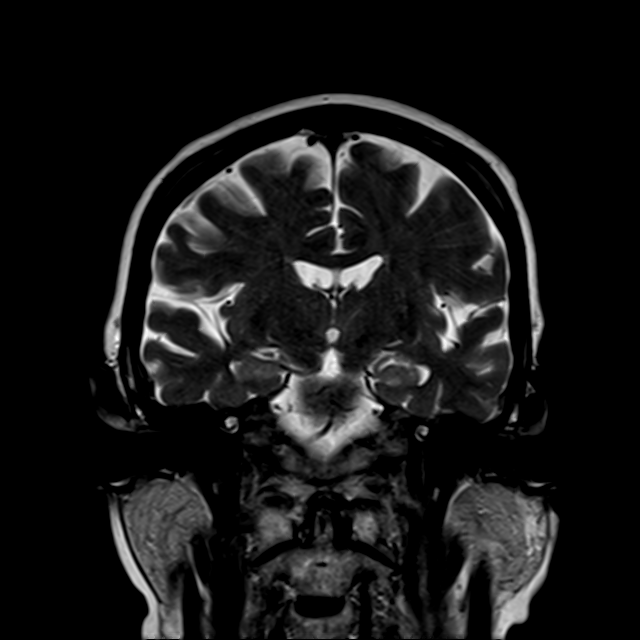
[im 27/27]
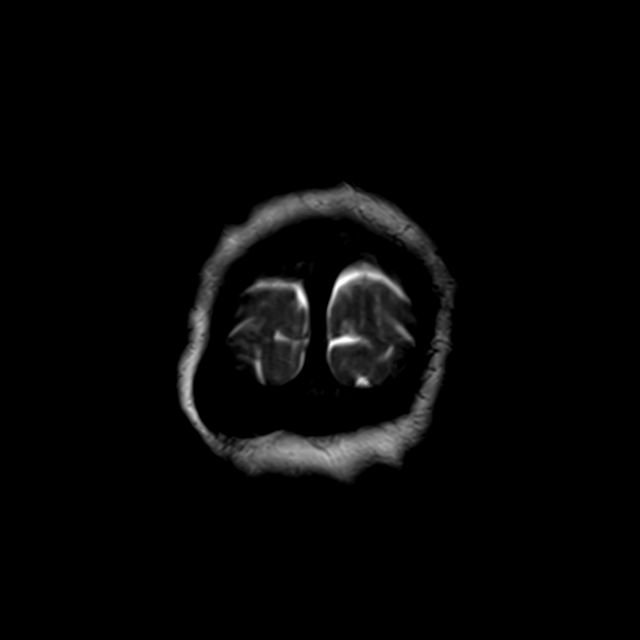

[43 of 48 positions shown; findings below may reference images not displayed]

FINDINGS: Brain: Diffusion imaging shows a punctate acute infarction in the
subcortical white matter of the right posterior frontal region.
There is acute infarction in the left posterior frontal region
affecting a gyrus and the underlying white matter. This is probably
the precentral gyrus. No evidence of swelling or hemorrhage.

Elsewhere, brainstem shows chronic small-vessel change of the pons.
There is a 1.2 Cm meningioma along the left edge of the foramen
magnum without mass-effect upon the brainstem. Cerebral hemispheres
show atrophy with mild chronic small-vessel ischemic change of the
white matter. No large vessel territory infarction. No intra-axial
mass lesion, hemorrhage, hydrocephalus or extra-axial collection.

Vascular: Major vessels at the base of the brain show flow.

Skull and upper cervical spine: Negative except for a right
calvarial osteoma as shown by CT.

Sinuses/Orbits: Clear/normal

Other: None
IMPRESSION: Acute cortical infarction in the left posterior frontal region,
probably the precentral gyrus. No swelling or hemorrhage. Punctate
acute infarction in the subcortical white matter of the right
posterior frontal lobe. Bilateral acute infarctions suggests embolic
disease from the heart or ascending aorta.

Chronic small-vessel ischemic changes of the pons and cerebral
hemispheric white matter.

12 mm meningioma along the left edge of the foramen magnum, without
neural compression.
